# Patient Record
Sex: Female | Born: 1965 | Race: Asian | Hispanic: No | Marital: Single | Smoking: Never smoker
Health system: Southern US, Community
[De-identification: ages and names within clinical notes are randomized; demographics above are authoritative.]

## PROBLEM LIST (undated history)

## (undated) DIAGNOSIS — C801 Malignant (primary) neoplasm, unspecified: Secondary | ICD-10-CM

## (undated) DIAGNOSIS — R112 Nausea with vomiting, unspecified: Secondary | ICD-10-CM

## (undated) DIAGNOSIS — F32A Depression, unspecified: Secondary | ICD-10-CM

## (undated) DIAGNOSIS — B977 Papillomavirus as the cause of diseases classified elsewhere: Secondary | ICD-10-CM

## (undated) DIAGNOSIS — Z9889 Other specified postprocedural states: Secondary | ICD-10-CM

## (undated) DIAGNOSIS — R42 Dizziness and giddiness: Secondary | ICD-10-CM

## (undated) DIAGNOSIS — Z170 Estrogen receptor positive status [ER+]: Principal | ICD-10-CM

## (undated) DIAGNOSIS — C50411 Malignant neoplasm of upper-outer quadrant of right female breast: Principal | ICD-10-CM

## (undated) DIAGNOSIS — F329 Major depressive disorder, single episode, unspecified: Secondary | ICD-10-CM

## (undated) DIAGNOSIS — J310 Chronic rhinitis: Secondary | ICD-10-CM

## (undated) DIAGNOSIS — Z803 Family history of malignant neoplasm of breast: Secondary | ICD-10-CM

## (undated) DIAGNOSIS — F419 Anxiety disorder, unspecified: Secondary | ICD-10-CM

## (undated) DIAGNOSIS — R011 Cardiac murmur, unspecified: Secondary | ICD-10-CM

## (undated) DIAGNOSIS — Z8489 Family history of other specified conditions: Secondary | ICD-10-CM

## (undated) DIAGNOSIS — Z801 Family history of malignant neoplasm of trachea, bronchus and lung: Secondary | ICD-10-CM

## (undated) DIAGNOSIS — E7800 Pure hypercholesterolemia, unspecified: Secondary | ICD-10-CM

## (undated) HISTORY — DX: Depression, unspecified: F32.A

## (undated) HISTORY — DX: Family history of malignant neoplasm of trachea, bronchus and lung: Z80.1

## (undated) HISTORY — DX: Major depressive disorder, single episode, unspecified: F32.9

## (undated) HISTORY — DX: Malignant neoplasm of upper-outer quadrant of right female breast: C50.411

## (undated) HISTORY — DX: Family history of malignant neoplasm of breast: Z80.3

## (undated) HISTORY — DX: Papillomavirus as the cause of diseases classified elsewhere: B97.7

## (undated) HISTORY — DX: Estrogen receptor positive status (ER+): Z17.0

## (undated) HISTORY — DX: Pure hypercholesterolemia, unspecified: E78.00

## (undated) HISTORY — DX: Anxiety disorder, unspecified: F41.9

## (undated) HISTORY — PX: WISDOM TOOTH EXTRACTION: SHX21

---

## 2016-05-07 HISTORY — PX: BREAST BIOPSY: SHX20

## 2016-12-05 ENCOUNTER — Other Ambulatory Visit: Payer: Self-pay | Admitting: Radiology

## 2016-12-07 ENCOUNTER — Telehealth: Payer: Self-pay | Admitting: *Deleted

## 2016-12-07 NOTE — Telephone Encounter (Signed)
Confirmed [facility] for 12/12/16 at 1215 .  Instructions and contact information given.

## 2016-12-11 ENCOUNTER — Other Ambulatory Visit: Payer: Self-pay | Admitting: Emergency Medicine

## 2016-12-11 ENCOUNTER — Other Ambulatory Visit: Payer: Self-pay | Admitting: *Deleted

## 2016-12-11 DIAGNOSIS — Z170 Estrogen receptor positive status [ER+]: Secondary | ICD-10-CM

## 2016-12-11 DIAGNOSIS — C50411 Malignant neoplasm of upper-outer quadrant of right female breast: Secondary | ICD-10-CM

## 2016-12-11 DIAGNOSIS — Z171 Estrogen receptor negative status [ER-]: Principal | ICD-10-CM

## 2016-12-11 HISTORY — DX: Malignant neoplasm of upper-outer quadrant of right female breast: C50.411

## 2016-12-12 ENCOUNTER — Ambulatory Visit: Payer: BLUE CROSS/BLUE SHIELD | Attending: General Surgery | Admitting: Physical Therapy

## 2016-12-12 ENCOUNTER — Ambulatory Visit (HOSPITAL_BASED_OUTPATIENT_CLINIC_OR_DEPARTMENT_OTHER): Payer: BLUE CROSS/BLUE SHIELD | Admitting: Hematology and Oncology

## 2016-12-12 ENCOUNTER — Encounter: Payer: Self-pay | Admitting: Hematology and Oncology

## 2016-12-12 ENCOUNTER — Other Ambulatory Visit (HOSPITAL_BASED_OUTPATIENT_CLINIC_OR_DEPARTMENT_OTHER): Payer: BLUE CROSS/BLUE SHIELD

## 2016-12-12 ENCOUNTER — Encounter: Payer: Self-pay | Admitting: Physical Therapy

## 2016-12-12 ENCOUNTER — Ambulatory Visit
Admission: RE | Admit: 2016-12-12 | Discharge: 2016-12-12 | Disposition: A | Discharge status: Home / Self Care | Payer: BLUE CROSS/BLUE SHIELD | Source: Ambulatory Visit | Attending: Radiation Oncology | Admitting: Radiation Oncology

## 2016-12-12 DIAGNOSIS — C50411 Malignant neoplasm of upper-outer quadrant of right female breast: Secondary | ICD-10-CM | POA: Diagnosis present

## 2016-12-12 DIAGNOSIS — R293 Abnormal posture: Secondary | ICD-10-CM | POA: Insufficient documentation

## 2016-12-12 DIAGNOSIS — Z171 Estrogen receptor negative status [ER-]: Secondary | ICD-10-CM | POA: Diagnosis present

## 2016-12-12 DIAGNOSIS — Z170 Estrogen receptor positive status [ER+]: Secondary | ICD-10-CM | POA: Diagnosis not present

## 2016-12-12 LAB — CBC WITH DIFFERENTIAL/PLATELET
BASO%: 1.2 % (ref 0.0–2.0)
Basophils Absolute: 0.1 10*3/uL (ref 0.0–0.1)
EOS%: 1.9 % (ref 0.0–7.0)
Eosinophils Absolute: 0.1 10*3/uL (ref 0.0–0.5)
HCT: 45.6 % (ref 34.8–46.6)
HGB: 15.3 g/dL (ref 11.6–15.9)
LYMPH#: 1.6 10*3/uL (ref 0.9–3.3)
LYMPH%: 21.9 % (ref 14.0–49.7)
MCH: 30 pg (ref 25.1–34.0)
MCHC: 33.6 g/dL (ref 31.5–36.0)
MCV: 89.4 fL (ref 79.5–101.0)
MONO#: 0.5 10*3/uL (ref 0.1–0.9)
MONO%: 6.5 % (ref 0.0–14.0)
NEUT%: 68.5 % (ref 38.4–76.8)
NEUTROS ABS: 5 10*3/uL (ref 1.5–6.5)
PLATELETS: 210 10*3/uL (ref 145–400)
RBC: 5.1 10*6/uL (ref 3.70–5.45)
RDW: 13.8 % (ref 11.2–14.5)
WBC: 7.3 10*3/uL (ref 3.9–10.3)

## 2016-12-12 LAB — COMPREHENSIVE METABOLIC PANEL
ALT: 15 U/L (ref 0–55)
ANION GAP: 11 meq/L (ref 3–11)
AST: 19 U/L (ref 5–34)
Albumin: 4 g/dL (ref 3.5–5.0)
Alkaline Phosphatase: 47 U/L (ref 40–150)
BUN: 9.3 mg/dL (ref 7.0–26.0)
CO2: 24 meq/L (ref 22–29)
CREATININE: 0.8 mg/dL (ref 0.6–1.1)
Calcium: 9.7 mg/dL (ref 8.4–10.4)
Chloride: 106 mEq/L (ref 98–109)
EGFR: 88 mL/min/{1.73_m2} — ABNORMAL LOW (ref >90)
Glucose: 90 mg/dl (ref 70–140)
Potassium: 4.2 mEq/L (ref 3.5–5.1)
Sodium: 141 mEq/L (ref 136–145)
Total Bilirubin: 0.54 mg/dL (ref 0.20–1.20)
Total Protein: 7.7 g/dL (ref 6.4–8.3)

## 2016-12-12 NOTE — Progress Notes (Signed)
Radiation Oncology         [phone] ________________________________  Initial Outpatient  Consultation  Name: Lorraine Dean MRN: [id number]  Date: 12/12/2016  DOB: 1965-05-19  CC:Lorraine Pepper, MD  Lorraine Kussmaul, MD   REFERRING PHYSICIAN: [name], MD  DIAGNOSIS:51 y.o. woman with Invasive Ductal Carcinoma with Calcifications, grade 3, presenting in the right breast Stage IB (cT1c, cN0, cM0, G3, ER: Negative, PR: Negative, HER2: Negative)   HISTORY OF PRESENT ILLNESS::Lorraine Dean is a 51 y.o. female who is recently underwent routine screening and was noted to have a new cluster of heterogeneous calcifications within the right breast. Additional imaging was recommended. The patient proceeded to undergo a unilateral right digital diagnostic mammogram which revealed grouped pleomorphic calcifications within the right breast central to the nipple, middle depth spanned over 1.5 cm. On ultrasound this area measured 1.2 cm. Ultrasound-guided biopsy performed which revealed invasive ductal carcinoma, triple negative. With this information the patient is now seen in the multidisciplinary breast clinic. Earlier in the day the patient's imaging studies and pathology was reviewed at the multidisciplinary breast conference.  PREVIOUS RADIATION THERAPY: No  PAST MEDICAL HISTORY:  has a past medical history of Anxiety; Depression; High cholesterol; and HPV in female.    PAST SURGICAL HISTORY:No past surgical history on file.  FAMILY HISTORY: family history includes Breast cancer in her maternal aunt; Lung cancer in her maternal aunt and paternal aunt.  SOCIAL HISTORY:  reports that she has never smoked. She does not have any smokeless tobacco history on file. She reports that she drinks alcohol. She reports that she does not use drugs.  ALLERGIES: Patient has no allergy information on record.  MEDICATIONS:  Current Outpatient Prescriptions  Medication Sig Dispense Refill  . calcium  carbonate 1250 MG capsule Take 1,250 mg by mouth daily.    . fluticasone (FLONASE) 50 MCG/ACT nasal spray Place 1 spray into both nostrils as needed for allergies or rhinitis.    Marland Kitchen GARCINIA CAMBOGIA-CHROMIUM PO Take 1 tablet by mouth daily.    Marland Kitchen KRILL OIL PO Take 1 tablet by mouth daily.    . norgestimate-ethinyl estradiol (ORTHO-CYCLEN,SPRINTEC,PREVIFEM) 0.25-35 MG-MCG tablet Take 1 tablet by mouth daily.    . Probiotic Product (ALIGN PO) Take 1 tablet by mouth daily.    Marland Kitchen UNABLE TO FIND Take 1 tablet by mouth daily. Eyes hair and nail vitamin    . VITAMIN D, CHOLECALCIFEROL, PO Take 1 tablet by mouth daily.     No current facility-administered medications for this encounter.     REVIEW OF SYSTEMS:  REVIEW OF SYSTEMS: A 10+ POINT REVIEW OF SYSTEMS WAS OBTAINED including neurology, dermatology, psychiatry, cardiac, respiratory, lymph, extremities, GI, GU, musculoskeletal, constitutional, reproductive, HEENT. All pertinent positives are noted in the HPI. All others are negative.   PHYSICAL EXAM:  Lungs are clear to auscultation bilaterally. Heart has regular rate and rhythm. No palpable cervical, supraclavicular, or axillary adenopathy. Abdomen soft, non-tender, normal bowel sounds.  Oncology Vitals 12/12/2016  Weight 46.947 kg  Weight (lbs) 103 lbs 8 oz  Temp 98.3  Pulse 77  Resp 20  SpO2 100   Breast exam Left breast, No palpable mass nipple discharge or bleeding Right breast, no palpable mass nipple discharge or bleeding. The patient was noted to have some bruising along the lateral aspect of the breast area  ECOG = 1  LABORATORY DATA:  Lab Results  Component Value Date   WBC 7.3 12/12/2016   HGB 15.3 12/12/2016  HCT 45.6 12/12/2016   MCV 89.4 12/12/2016   PLT 210 12/12/2016   NEUTROABS 5.0 12/12/2016   Lab Results  Component Value Date   NA 141 12/12/2016   K 4.2 12/12/2016   CO2 24 12/12/2016   GLUCOSE 90 12/12/2016   CREATININE 0.8 12/12/2016   CALCIUM 9.7  12/12/2016      RADIOGRAPHY: No results found.    IMPRESSION: Stage IB (cT1c, cN0, cM0, G3, ER: Negative, PR: Negative, HER2: Negative) Invasive ductal carcinoma of the right breast,  grade 3. Patient is a candidate for Right Breast Lumpectomy with Sentinel Lymph Node, External Beam Radiation (XRT).   I recommend adjuvant radiotherapy to the right breast in order to decrease her risk of locoregional recurrence .  We discussed the risks, benefits, and side effects of radiotherapy. We discussed that radiation would take approximately 4-6 weeks to complete. We spoke about acute effects including skin irritation and fatigue as well as much less common late effects including injury to internal organs of the chest. We spoke about the latest technology that is used to minimize the risk of late effects for breast cancer patients undergoing radiotherapy. No guarantees of treatment were given. She is enthusiastic about proceeding with treatment. I look forward to participating in her care.    PLAN:Patient is a candidate for Right Breast Lumpectomy with Sentinel Lymph Node, External Beam Radiation (XRT), genetics, adjuvant chemotherapy to be determined.   ------------------------------------------------   Blair Promise, PhD, MD   This document serves as a record of services personally performed by Lorraine Pray MD. It was created on his behalf by Lorraine Dean, a trained medical scribe. The creation of this record is based on the scribe's personal observations and the provider's statements to them. This document has been checked and approved by the attending provider.

## 2016-12-12 NOTE — Therapy (Signed)
[place], [place], [address] Phone: [phone]   Fax:  [phone]  Physical Therapy Evaluation  Patient Details  Name: Lorraine Dean MRN: [id number] Date of Birth: 02-20-1966 Referring Provider: Dr. Autumn Messing  Encounter Date: 12/12/2016      PT End of Session - 12/12/16 1900    Visit Number 1   Number of Visits 1   PT Start Time 1505   PT Stop Time 1530   PT Time Calculation (min) 25 min   Activity Tolerance Patient tolerated treatment well   Behavior During Therapy [facility] for tasks assessed/performed      Past Medical History:  Diagnosis Date  . Anxiety   . Depression   . High cholesterol   . HPV in female     History reviewed. No pertinent surgical history.  There were no vitals filed for this visit.       Subjective Assessment - 12/12/16 1852    Subjective Patient reports she is here today to be seen by her medical team for her newly diagnosed right breast cancer.   Pertinent History Patient was diagnosed on 11/28/16 with right Triple negative grade 3 invasive ductal carcinoma breast cancer. It measures 1.5 cm and is located in the upper outer quadrant with a Ki67 of 5%.   Patient Stated Goals Reduce lymphedema risk and learn post op shoulder ROM HEP   Currently in Pain? No/denies            [facility] PT Assessment - 12/12/16 0001      Assessment   Medical Diagnosis Right breast cancer   Referring Provider Dr. Autumn Messing   Onset Date/Surgical Date 11/28/16   Hand Dominance Right   Prior Therapy none     Precautions   Precautions Other (comment)   Precaution Comments active cancer     Restrictions   Weight Bearing Restrictions No     Balance Screen   Has the patient fallen in the past 6 months No   Has the patient had a decrease in activity level because of a fear of falling?  No   Is the patient reluctant to leave their home because of a fear of falling?  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   Leisure She does not exercise     Cognition   Overall Cognitive Status Within Functional Limits for tasks assessed     Posture/Postural Control   Posture/Postural Control Postural limitations   Postural Limitations Rounded Shoulders;Forward head     ROM / Strength   AROM / PROM / Strength AROM;Strength     AROM   AROM Assessment Site Shoulder;Cervical   Right/Left Shoulder Right;Left   Right Shoulder Extension 42 Degrees   Right Shoulder Flexion 154 Degrees   Right Shoulder ABduction 170 Degrees   Right Shoulder Internal Rotation 76 Degrees   Right Shoulder External Rotation 90 Degrees   Left Shoulder Extension 42 Degrees   Left Shoulder Flexion 154 Degrees   Left Shoulder ABduction 176 Degrees   Left Shoulder Internal Rotation 80 Degrees   Left Shoulder External Rotation 88 Degrees   Cervical Flexion WNL   Cervical Extension WNL   Cervical - Right Side Bend WNL  Cervical - Left Side Bend WNL   Cervical - Right Rotation WNL   Cervical - Left Rotation WNL     Strength   Overall Strength Within functional limits for tasks performed           LYMPHEDEMA/ONCOLOGY QUESTIONNAIRE - 12/12/16 1858      Type   Cancer Type Right breast cancer     Lymphedema Assessments   Lymphedema Assessments Upper extremities     Right Upper Extremity Lymphedema   10 cm Proximal to Olecranon Process 25 cm   Olecranon Process 21.5 cm   10 cm Proximal to Ulnar Styloid Process 18.7 cm   Just Proximal to Ulnar Styloid Process 12.8 cm   Across Hand at [employer] 15.7 cm   At [place] of 2nd Digit 5.2 cm     Left Upper Extremity Lymphedema   10 cm Proximal to Olecranon Process 25 cm   Olecranon Process 21.4 cm   10 cm Proximal to  Ulnar Styloid Process 18 cm   Just Proximal to Ulnar Styloid Process 12.5 cm   Across Hand at [employer] 15.5 cm   At [place] of 2nd Digit 4.8 cm         Objective measurements completed on examination: See above findings.        Patient was instructed today in a home exercise program today for post op shoulder range of motion. These included active assist shoulder flexion in sitting, scapular retraction, wall walking with shoulder abduction, and hands behind head external rotation.  She was encouraged to do these twice a day, holding 3 seconds and repeating 5 times when permitted by her physician.             PT Education - 12/12/16 1859    Education provided Yes   Education Details Lymphedema risk reduction and post op shoulder ROM HEP   Person(s) Educated Patient   Methods Explanation;Demonstration;Handout   Comprehension Returned demonstration;Verbalized understanding              Breast Clinic Goals - 12/12/16 1905      Patient will be able to verbalize understanding of pertinent lymphedema risk reduction practices relevant to her diagnosis specifically related to skin care.   Time 1   Period Days   Status Achieved     Patient will be able to return demonstrate and/or verbalize understanding of the post-op home exercise program related to regaining shoulder range of motion.   Time 1   Period Days   Status Achieved     Patient will be able to verbalize understanding of the importance of attending the postoperative After Breast Cancer Class for further lymphedema risk reduction education and therapeutic exercise.   Time 1   Period Days   Status Achieved               Plan - 12/12/16 1901    Clinical Impression Statement Patient was diagnosed on 11/28/16 with right Triple negative grade 3 invasive ductal carcinoma breast cancer. It measures 1.5 cm and is located in the upper outer quadrant with a Ki67 of 5%. Her multidisciplinary medical team  met prior to her assessments to determine a recommended treatment plan. She is planning to have a right lumpectomy and sentinel node biopsy followed by radiation. Final pathology will determine her need for chemotherapy.   History and Personal Factors relevant to plan of care: None   Clinical Presentation Stable   Clinical Decision Making Low   Rehab  Potential Excellent   Clinical Impairments Affecting Rehab Potential None   PT Frequency One time visit   PT Treatment/Interventions Patient/family education;Therapeutic exercise   PT Next Visit Plan Will f/u after surgery to determine PT needs   PT Home Exercise Plan Post op shoulder ROM HEP   Consulted and Agree with Plan of Care Patient;Other (Comment)  Friend      Patient will benefit from skilled therapeutic intervention in order to improve the following deficits and impairments:  Postural dysfunction, Decreased knowledge of precautions, Pain, Impaired UE functional use, Decreased range of motion  Visit Diagnosis: Carcinoma of upper-outer quadrant of right breast in female, estrogen receptor negative ([place]) - Plan: PT plan of care cert/re-cert  Abnormal posture - Plan: PT plan of care cert/re-cert   Patient will follow up at outpatient cancer rehab if needed following surgery.  If the patient requires physical therapy at that time, a specific plan will be dictated and sent to the referring physician for approval. The patient was educated today on appropriate basic range of motion exercises to begin post operatively and the importance of attending the After Breast Cancer class following surgery.  Patient was educated today on lymphedema risk reduction practices as it pertains to recommendations that will benefit the patient immediately following surgery.  She verbalized good understanding.  No additional physical therapy is indicated at this time.      Problem List Patient Active Problem List   Diagnosis Date Noted  . Malignant neoplasm  of upper-outer quadrant of right breast in female, estrogen receptor positive ([place]) 12/11/2016    Annia Friendly, PT 12/12/16 7:08 PM  [place], [place], [address] Phone: [phone]   Fax:  [phone]  Name: Annamaria Salah MRN: [id number] Date of Birth: 04-01-1966

## 2016-12-12 NOTE — Patient Instructions (Signed)

## 2016-12-13 ENCOUNTER — Other Ambulatory Visit: Payer: BLUE CROSS/BLUE SHIELD

## 2016-12-13 ENCOUNTER — Ambulatory Visit: Payer: Self-pay | Admitting: General Surgery

## 2016-12-13 ENCOUNTER — Ambulatory Visit (HOSPITAL_BASED_OUTPATIENT_CLINIC_OR_DEPARTMENT_OTHER): Payer: BLUE CROSS/BLUE SHIELD | Admitting: Genetics

## 2016-12-13 ENCOUNTER — Encounter: Payer: Self-pay | Admitting: General Practice

## 2016-12-13 ENCOUNTER — Encounter: Payer: Self-pay | Admitting: Genetics

## 2016-12-13 DIAGNOSIS — C50411 Malignant neoplasm of upper-outer quadrant of right female breast: Secondary | ICD-10-CM

## 2016-12-13 DIAGNOSIS — Z801 Family history of malignant neoplasm of trachea, bronchus and lung: Secondary | ICD-10-CM | POA: Diagnosis not present

## 2016-12-13 DIAGNOSIS — Z803 Family history of malignant neoplasm of breast: Secondary | ICD-10-CM | POA: Diagnosis not present

## 2016-12-13 DIAGNOSIS — Z170 Estrogen receptor positive status [ER+]: Secondary | ICD-10-CM | POA: Diagnosis not present

## 2016-12-13 DIAGNOSIS — Z171 Estrogen receptor negative status [ER-]: Principal | ICD-10-CM

## 2016-12-13 HISTORY — DX: Family history of malignant neoplasm of trachea, bronchus and lung: Z80.1

## 2016-12-13 HISTORY — DX: Family history of malignant neoplasm of breast: Z80.3

## 2016-12-13 NOTE — Progress Notes (Signed)
Lorraine Dean Psychosocial Distress Screening Spiritual Care  Met with [name] and close friend/colleague May (who supported her own cousin through breast cancer) in Breast Multidisciplinary Clinic to introduce [place] team/resources, reviewing distress screen per protocol.  The patient scored a 9 on the Psychosocial Distress Thermometer which indicates severe distress. Also assessed for distress and other psychosocial needs.   ONCBCN DISTRESS SCREENING 12/13/2016  Screening Type Initial Screening  Distress experienced in past week (1-10) 9  Emotional problem type Depression;Adjusting to illness  Spiritual/Religous concerns type Relating to God;Loss of Faith;Facing my mortality  Information Concerns Type Lack of info about diagnosis  Physical Problem type Sleep/insomnia;Loss of appetitie  Referral to support programs Yes   Per pt, she lives alone, having relocated from [place] (where extended family lives) for her work as a English as a second language teacher in [employer].  She experienced very high distress about dx in part because of her social isolation:  Her aunt had cancer, her sister died recently (sepsis/sudden), and her mom is still very actively grieving the loss, so [name] chose not to share about dx until learning about scope at [facility].  We talked about how [name] programming could help with social connection, meaning-making, and coping.  Encouraged self-care and relaxation in wake of high distress and adrenaline prior to [facility].  Follow up needed: No.  Pt is aware of ongoing [name] team/programming availability and plans to contact Team as she is ready.   [name], [place], [place] [facility] Pager [phone] Voicemail [phone]

## 2016-12-13 NOTE — Progress Notes (Signed)
[facility] CONSULT NOTE  Patient Care Team: Farris Has, MD as PCP - General (Family Medicine) Serena Croissant, MD as Consulting Physician (Hematology and Oncology) Griselda Miner, MD as Consulting Physician (General Surgery) Antony Blackbird, MD as Consulting Physician (Radiation Oncology)  CHIEF COMPLAINTS/PURPOSE OF CONSULTATION:  Newly diagnosed breast cancer  HISTORY OF PRESENTING ILLNESS:  Lorraine Dean 51 y.o. female is here because of recent diagnosis of rt breast cancer. Patient had a screening mammogram that detected calcifications that span 1.5 cm and ultrasound revealed a mas measuring 1.2 cm. Biopsy revealed triple negative IDC grade 3 tumor. We discussed her case in the tumor board and she is here at [facility] clinic to discuss the treatment plan   I reviewed her records extensively and collaborated the history with the patient.  SUMMARY OF ONCOLOGIC HISTORY:   Malignant neoplasm of upper-outer quadrant of right breast in female, estrogen receptor positive ([facility])   12/05/2016 Initial Diagnosis    Screening detected calcifications Rt breast spanning 1.5 cm, U/S measured a lesion 1.2 cm size, Biopsy revealed IDC Grade 3, ER/PR Negative, Her 2 Neg Ratio: 1.05; Ki 67: 20%; T1bN0 Stage 1A Clinical stage      MEDICAL HISTORY:  Past Medical History:  Diagnosis Date  . Anxiety   . Depression   . High cholesterol   . HPV in female     SURGICAL HISTORY: History reviewed. No pertinent surgical history.  SOCIAL HISTORY: Social History   Social History  . Marital status: Single    Spouse name: N/A  . Number of children: N/A  . Years of education: N/A   Occupational History  . Not on file.   Social History Main Topics  . Smoking status: Never Smoker  . Smokeless tobacco: Not on file  . Alcohol use Yes     Comment: occas  . Drug use: No  . Sexual activity: Not on file   Other Topics Concern  . Not on file   Social History Narrative  . No narrative on  file    FAMILY HISTORY: Family History  Problem Relation Age of Onset  . Breast cancer Maternal Aunt   . Lung cancer Maternal Aunt   . Lung cancer Paternal Aunt     ALLERGIES:  has no allergies on file.  MEDICATIONS:  Current Outpatient Prescriptions  Medication Sig Dispense Refill  . calcium carbonate 1250 MG capsule Take 1,250 mg by mouth daily.    . fluticasone (FLONASE) 50 MCG/ACT nasal spray Place 1 spray into both nostrils as needed for allergies or rhinitis.    Marland Kitchen GARCINIA CAMBOGIA-CHROMIUM PO Take 1 tablet by mouth daily.    Marland Kitchen KRILL OIL PO Take 1 tablet by mouth daily.    . norgestimate-ethinyl estradiol (ORTHO-CYCLEN,SPRINTEC,PREVIFEM) 0.25-35 MG-MCG tablet Take 1 tablet by mouth daily.    . Probiotic Product (ALIGN PO) Take 1 tablet by mouth daily.    Marland Kitchen UNABLE TO FIND Take 1 tablet by mouth daily. Eyes hair and nail vitamin    . VITAMIN D, CHOLECALCIFEROL, PO Take 1 tablet by mouth daily.     No current facility-administered medications for this visit.     REVIEW OF SYSTEMS:   Constitutional: Denies fevers, chills or abnormal night sweats Eyes: Denies blurriness of vision, double vision or watery eyes Ears, nose, mouth, throat, and face: Denies mucositis or sore throat Respiratory: Denies cough, dyspnea or wheezes Cardiovascular: Denies palpitation, chest discomfort or lower extremity swelling Gastrointestinal:  Denies nausea, heartburn or change  in bowel habits Skin: Denies abnormal skin rashes Lymphatics: Denies new lymphadenopathy or easy bruising Neurological:Denies numbness, tingling or new weaknesses Behavioral/Psych: Mood is stable, no new changes  Breast:  Denies any palpable lumps or discharge All other systems were reviewed with the patient and are negative.  PHYSICAL EXAMINATION: ECOG PERFORMANCE STATUS: 0 - Asymptomatic  Vitals:   12/12/16 1302  BP: 123/72  Pulse: 77  Resp: 20  Temp: 98.3 F (36.8 C)   Filed Weights   12/12/16 1302  Weight:  103 lb 8 oz (46.9 kg)    GENERAL:alert, no distress and comfortable SKIN: skin color, texture, turgor are normal, no rashes or significant lesions EYES: normal, conjunctiva are pink and non-injected, sclera clear OROPHARYNX:no exudate, no erythema and lips, buccal mucosa, and tongue normal  NECK: supple, thyroid normal size, non-tender, without nodularity LYMPH:  no palpable lymphadenopathy in the cervical, axillary or inguinal LUNGS: clear to auscultation and percussion with normal breathing effort HEART: regular rate & rhythm and no murmurs and no lower extremity edema ABDOMEN:abdomen soft, non-tender and normal bowel sounds Musculoskeletal:no cyanosis of digits and no clubbing  PSYCH: alert & oriented x 3 with fluent speech NEURO: no focal motor/sensory deficits BREAST: No palpable nodules in breast. No palpable axillary or supraclavicular lymphadenopathy (exam performed in the presence of a chaperone)   LABORATORY DATA:  I have reviewed the data as listed Lab Results  Component Value Date   WBC 7.3 12/12/2016   HGB 15.3 12/12/2016   HCT 45.6 12/12/2016   MCV 89.4 12/12/2016   PLT 210 12/12/2016   Lab Results  Component Value Date   NA 141 12/12/2016   K 4.2 12/12/2016   CO2 24 12/12/2016    RADIOGRAPHIC STUDIES: I have personally reviewed the radiological reports and agreed with the findings in the report.  ASSESSMENT AND PLAN:  Malignant neoplasm of upper-outer quadrant of right breast in female, estrogen receptor positive ([place]) 12/05/16: Screening detected calcifications Rt breast spanning 1.5 cm, U/S measured a lesion 1.2 cm size, Biopsy revealed IDC Grade 3, ER/PR Negative, Her 2 Neg Ratio: 1.05; Ki 67: 20%; T1bN0 Stage 1A Clinical stage  Pathology and radiology counseling:Discussed with the patient, the details of pathology including the type of breast cancer,the clinical staging, the significance of ER, PR and HER-2/neu receptors and the implications for treatment.  After reviewing the pathology in detail, we proceeded to discuss the different treatment options between surgery, radiation, chemotherapy, antiestrogen therapies.  Recommendations: 1. Breast conserving surgery followed by 2. Adj chemo if final tumor size is > 0.5 cm 3. Adjuvant radiation therapy followed by  Return to clinic after surgery to discuss final pathology report and to determine if she needs adj chemo.    All questions were answered. The patient knows to call the clinic with any problems, questions or concerns.    Rulon Eisenmenger, MD 12/13/16

## 2016-12-13 NOTE — Progress Notes (Signed)
REFERRING PROVIDER: Nicholas Lose, MD [address] [place], [place] [address]  PRIMARY PROVIDER:  [name], MD  PRIMARY REASON FOR VISIT:  1. Malignant neoplasm of upper-outer quadrant of right breast in female, estrogen receptor positive ([place])   2. Family history of breast cancer   3. Family history of lung cancer     HISTORY OF PRESENT ILLNESS:   Lorraine Dean, a 51 y.o. female, was seen for a [facility] cancer genetics consultation at the request of Dr. Lindi Adie due to a personal and family history of cancer.  Lorraine Dean presents to clinic today to discuss the possibility of a hereditary predisposition to cancer, genetic testing, and to further clarify her future cancer risks, as well as potential cancer risks for family members.   In  August 2018, at the age of 80, Lorraine Dean was diagnosed with Triple Negative Invasive Ductal Carcinoma of the right breast. She is considering treatment options at this time.    CANCER HISTORY:    Malignant neoplasm of upper-outer quadrant of right breast in female, estrogen receptor positive ([place])   12/05/2016 Initial Diagnosis    Screening detected calcifications Rt breast spanning 1.5 cm, U/S measured a lesion 1.2 cm size, Biopsy revealed IDC Grade 3, ER/PR Negative, Her 2 Neg Ratio: 1.05; Ki 67: 20%; T1bN0 Stage 1A Clinical stage        HORMONAL RISK FACTORS:  Menarche was at age 81  First live birth at age N/A.  Ovaries intact: yes.  Hysterectomy: no.  Mammogram within the last year: yes.  Past Medical History:  Diagnosis Date  . Anxiety   . Depression   . Family history of breast cancer 12/13/2016  . Family history of lung cancer 12/13/2016  . High cholesterol   . HPV in female     No past surgical history on file.  Social History   Social History  . Marital status: Single    Spouse name: N/A  . Number of children: N/A  . Years of education: N/A   Social History Main Topics  . Smoking status: Never Smoker  .  Smokeless tobacco: Not on file  . Alcohol use Yes     Comment: occas  . Drug use: No  . Sexual activity: Not on file   Other Topics Concern  . Not on file   Social History Narrative  . No narrative on file     FAMILY HISTORY:  We obtained a detailed, 4-generation family history.  Significant diagnoses are listed below: Family History  Problem Relation Age of Onset  . Breast cancer Maternal Aunt   . Lung cancer Maternal Aunt   . Lung cancer Paternal Aunt    Ms Dean has no children or any siblings.  Her father is 56 with no history of cancer.  Lorraine Dean has 3 paternal uncles and 5 paternal aunts.  - 1 maternal aunt was diagnosed with lung cancer in her late 72's and died in her 57's.  She had no children. -1 maternal aunt is in her 57's and 20's with no history of cancer.  She has a daughter who was diagnosed with melanoma in her 46's and is now in her 3's/40's.  -Lorraine Dean has 2 paternal aunts and 1 paternal uncle who died in their 21's and 67's with no history of cancer -Lorraine Dean has 2 paternal uncles and 1 other paternal aunt who are in their 23's and 62's with no history of cancer.  Lorraine Dean has many  paternal cousins.  She has limited information about them, but does not know of any history of cancer.   Lorraine Dean paternal grandfather died in his 23's with no history of cancer.  He had heart disease. Lorraine Dean paternal grandmother died in her 34's with no history of cancer.  She had heart disease.    Lorraine Dean mother is 49 and has no history of cancer.  Lorraine Dean has 2 maternal aunts and 3 maternal uncles listed below: -1 maternal aunt died in her 82's with no history of cancer.  She had no children.  -1 maternal aunt was diagnosed with breast cancer at 26.  She developed lung cancer after, and the patient is unsure if the lung cancer was a primary or a metastasis of the breast cancer.  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  This aunt died at the age of 23.  She had no children.   -1 maternal uncle died in his 106's and had no children or any history of cancer.  -1 maternal uncle is in his late 90's with no history of cancer.  He has 4 children with no known history of cancer, although the patient has limited information about these cousins. -1 maternal uncle is in his late 60's with no history of cancer.  He has 3 children with no known history of cancer, although the patient has limited information about these cousins. Lorraine Dean maternal grandfather died in his 65's with no history of cancer. Lorraine Dean maternal grandmother died in her 40's with no history of cancer.   Lorraine Dean is unaware of previous family history of genetic testing for hereditary cancer risks. Patient's maternal ancestors are from the [place] descent, and paternal ancestors are from the [place]. There is no reported Ashkenazi Jewish ancestry. There is no known consanguinity.  GENETIC COUNSELING ASSESSMENT: Lorraine Dean is a 51 y.o. female with a personal and family history which is somewhat suggestive of a Hereditary Cancer Predisposition Syndrome. We, therefore, discussed and recommended the following at today's visit.   DISCUSSION:  We reviewed the characteristics, features and inheritance patterns of hereditary cancer syndromes. We also discussed genetic testing, including the appropriate family members to test, the process of testing, insurance coverage and turn-around-time for results. We discussed the implications of a negative, positive and/or variant of uncertain significant result. In order to get genetic test results in a timely manner so that Lorraine Dean can use these genetic test results for surgical decisions, we recommended Lorraine Dean pursue genetic testing for the [facility] Breast Cancer STAT Panel. The STAT Breast cancer panel offered by [facility] includes sequencing and rearrangement analysis for the  following 9 genes:  ATM, BRCA1, BRCA2, CDH1, CHEK2, PALB2, PTEN, STK11 and TP53.   If this test is negative, we then recommend Lorraine Dean pursue reflex genetic testing to the [facility] Common Hereditary Cancer gene panel. The Hereditary Gene Panel offered by [facility] includes sequencing and/or deletion duplication testing of the following 46 genes: APC, ATM, AXIN2, BARD1, BMPR1A, BRCA1, BRCA2, BRIP1, CDH1, CDKN2A (p14ARF), CDKN2A (p16INK4a), CHEK2, CTNNA1, DICER1, EPCAM (Deletion/duplication testing only), GREM1 (promoter region deletion/duplication testing only), KIT, MEN1, MLH1, MSH2, MSH3, MSH6, MUTYH, NBN, NF1, NHTL1, PALB2, PDGFRA, PMS2, POLD1, POLE, PTEN, RAD50, RAD51C, RAD51D, SDHB, SDHC, SDHD, SMAD4, SMARCA4. STK11, TP53, TSC1, TSC2, and VHL.  The following genes were evaluated for sequence changes only: SDHA and HOXB13 c.251G>A variant only.  We discussed that only 5-10% of cancers are associated  with a Hereditary cancer predisposition syndrome.  One of the most common hereditary cancer syndromes that increases breast cancer risk is called Hereditary Breast and Ovarian Cancer (HBOC) syndrome.  This syndrome is caused by mutations in the BRCA1 and BRCA2 genes.  This syndrome increases an individual's lifetime risk to develop breast, ovarian, pancreatic, and other types of cancer.  There are also many other cancer predisposition syndromes caused by mutations in several other genes.  Triple negative breast cancer specifically has been associated with this mutations in these genes.   We discussed that if she is found to have a mutation in one of these genes, it may impact surgical decisions, and alter future medical management recommendations such as increased cancer screenings and consideration of risk reducing surgeries.  A positive result could also have implications for the patient's family members.  A Negative result would mean we were unable to identify a hereditary component to her cancer, but does  not rule out the possibility of a hereditary basis for her cancer.  There could be mutations that are undetectable by current technology, or in genes not yet tested or identified to increase cancer risk.    We discussed the potential to find a Variant of Uncertain Significance or VUS.  These are variants that have not yet been identified as pathogenic or benign, and it is unknown if this variant is associated with increased cancer risk or if this is a normal finding.  Most VUS's are reclassified to benign or likely benign.   It should not be used to make medical management decisions. With time, we suspect the lab will determine the significance of any VUS's identified if any.   Based on Ms. Commins's personal history of cancer, she meets medical criteria for genetic testing. Despite that she meets criteria, she may still have an out of pocket cost. We discussed that if her out of pocket cost for testing is over $100, the laboratory will call and confirm whether she wants to proceed with testing.  If the out of pocket cost of testing is less than $100 she will be billed by the genetic testing laboratory.   PLAN: After considering the risks, benefits, and limitations, Ms. Ogden  provided informed consent to pursue genetic testing and the blood sample was sent to [facility] for analysis of the Breast Cancer STAT Panel (with plans to order Common Hereditary Cancer Panel if negative). Results should be available within approximately 5-10 days' time, at which point they will be disclosed by telephone to Ms. Thum, as will any additional recommendations warranted by these results. Ms. Mensch will receive a summary of her genetic counseling visit and a copy of her results once available. This information will also be available in Epic. We encouraged Ms. Seefeldt to remain in contact with cancer genetics annually so that we can continuously update the family history and inform her of any changes in cancer  genetics and testing that may be of benefit for her family. Ms. Tugwell questions were answered to her satisfaction today. Our contact information was provided should additional questions or concerns arise.  Lastly, we encouraged Ms. Holden to remain in contact with cancer genetics annually so that we can continuously update the family history and inform her of any changes in cancer genetics and testing that may be of benefit for this family.   Ms.  Childers questions were answered to her satisfaction today. Our contact information was provided should additional questions or concerns arise. Thank you for  the referral and allowing [place] to share in the care of your patient.   Tana Felts, MS Genetic Counselor [email or web address] phone: [phone]  The patient was seen for a total of 40 minutes in face-to-face genetic counseling.  This patient was discussed with Drs. Magrinat, Lindi Adie and/or Burr Medico who agrees with the above.

## 2016-12-13 NOTE — Assessment & Plan Note (Signed)
12/05/16: Screening detected calcifications Rt breast spanning 1.5 cm, U/S measured a lesion 1.2 cm size, Biopsy revealed IDC Grade 3, ER/PR Negative, Her 2 Neg Ratio: 1.05; Ki 67: 20%; T1bN0 Stage 1A Clinical stage  Pathology and radiology counseling:Discussed with the patient, the details of pathology including the type of breast cancer,the clinical staging, the significance of ER, PR and HER-2/neu receptors and the implications for treatment. After reviewing the pathology in detail, we proceeded to discuss the different treatment options between surgery, radiation, chemotherapy, antiestrogen therapies.  Recommendations: 1. Breast conserving surgery followed by 2. Adj chemo if final tumor size is > 0.5 cm 3. Adjuvant radiation therapy followed by  Return to clinic after surgery to discuss final pathology report and to determine if she needs adj chemo.

## 2016-12-13 NOTE — Progress Notes (Signed)
Nutrition Assessment  Reason for Assessment:  Pt seen in Breast Clinic on 12/12/2016 (late entry due to downtime)  ASSESSMENT:   51 year old female with new diagnosis of breast cancer.  Past medical history of HLD, HPV.    Patient reports normal appetite  Medications:  reviewed  Labs: reviewed  Anthropometrics:   Height: not measured Weight: 103 lb 8 oz  Stable weight per patient   NUTRITION DIAGNOSIS: Food and nutrition related knowledge deficit related to new diagnosis of breast cancer as evidenced by no prior need for nutrition related information.  INTERVENTION:   Discussed and provided packet of information regarding nutritional tips for breast cancer patients.  Questions answered.  Teachback method used.  Contact information provided and patient knows to contact me with questions/concerns.    MONITORING, EVALUATION, and GOAL: Pt will consume a healthy plant based diet to maintain lean body mass throughout treatment.   Chrystine Frogge B. Zenia Resides, [place], [place] Registered Dietitian [phone] (pager)

## 2016-12-18 ENCOUNTER — Telehealth: Payer: Self-pay | Admitting: *Deleted

## 2016-12-18 ENCOUNTER — Telehealth: Payer: Self-pay | Admitting: Hematology and Oncology

## 2016-12-18 NOTE — Telephone Encounter (Signed)
  Oncology Nurse Navigator Documentation  Navigator Location: [facility] (12/18/16 1100)   )Navigator Encounter Type: Telephone (12/18/16 1100) Telephone: Outgoing Call;Clinic/[facility]C Follow-up (12/18/16 1100)       Genetic Counseling Date: 12/13/16 (12/18/16 1100) Genetic Counseling Type: Urgent (12/18/16 1100)                                        Time Spent with Patient: 15 (12/18/16 1100)

## 2016-12-18 NOTE — Telephone Encounter (Signed)
sw pt to confirm 8/31 appt at 0930 per sch msg

## 2016-12-20 ENCOUNTER — Telehealth: Payer: Self-pay

## 2016-12-20 ENCOUNTER — Encounter: Payer: Self-pay | Admitting: Genetics

## 2016-12-20 ENCOUNTER — Telehealth: Payer: Self-pay | Admitting: Genetics

## 2016-12-20 DIAGNOSIS — Z1379 Encounter for other screening for genetic and chromosomal anomalies: Secondary | ICD-10-CM | POA: Insufficient documentation

## 2016-12-20 NOTE — Telephone Encounter (Signed)
Revealed negative preliminary genetic testing.  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  Discussed that we do not know why she has breast cancer or why there is cancer in the family. It could be due to a different gene that we are not testing, or maybe our current technology may not be able to pick something up.  It will be important for her to keep in contact with genetics to keep up with whether additional testing may be needed.  These were the high risk breast results that are helpful for her breast surgery decisions.   She would like reflex testing to the common hereditary cancer panel + melanoma genes, and we will have to wait for the full results of this panel.

## 2016-12-20 NOTE — Telephone Encounter (Signed)
SENT NOTES TO SCHEDULING 

## 2016-12-24 ENCOUNTER — Encounter (INDEPENDENT_AMBULATORY_CARE_PROVIDER_SITE_OTHER): Payer: Self-pay

## 2016-12-24 ENCOUNTER — Ambulatory Visit (INDEPENDENT_AMBULATORY_CARE_PROVIDER_SITE_OTHER): Payer: BLUE CROSS/BLUE SHIELD

## 2016-12-24 ENCOUNTER — Telehealth: Payer: Self-pay | Admitting: Genetics

## 2016-12-24 ENCOUNTER — Encounter: Payer: Self-pay | Admitting: Physician Assistant

## 2016-12-24 ENCOUNTER — Other Ambulatory Visit: Payer: Self-pay | Admitting: Physician Assistant

## 2016-12-24 ENCOUNTER — Ambulatory Visit (INDEPENDENT_AMBULATORY_CARE_PROVIDER_SITE_OTHER): Payer: BLUE CROSS/BLUE SHIELD | Admitting: Physician Assistant

## 2016-12-24 VITALS — BP 130/82 | HR 90 | Ht 61.0 in | Wt 103.0 lb

## 2016-12-24 DIAGNOSIS — Z01818 Encounter for other preprocedural examination: Secondary | ICD-10-CM | POA: Diagnosis not present

## 2016-12-24 DIAGNOSIS — R011 Cardiac murmur, unspecified: Secondary | ICD-10-CM

## 2016-12-24 DIAGNOSIS — E785 Hyperlipidemia, unspecified: Secondary | ICD-10-CM | POA: Diagnosis not present

## 2016-12-24 DIAGNOSIS — Z8249 Family history of ischemic heart disease and other diseases of the circulatory system: Secondary | ICD-10-CM | POA: Insufficient documentation

## 2016-12-24 DIAGNOSIS — R002 Palpitations: Secondary | ICD-10-CM | POA: Diagnosis not present

## 2016-12-24 NOTE — Progress Notes (Signed)
Cardiology Office Note    Date:  12/24/2016   ID:  [name], DOB [birth year], MRN [id number]  PCP:  [name], MD  Cardiologist: New  Chief Complaint  Patient presents with  . Pre-op Exam  . Palpitations    History of Present Illness:    Lorraine Dean is a 51 y.o. female who is being seen today for the evaluation of Palpitations at the request of [name], MD.  Patient was recently diagnosed with stage I breast CA and is having surgery at [facility] surgery 12/28/16. Patient has a long history of palpitations and was referred here for further evaluation. Was also told she had a heart murmur recent surgery consult. She's had a lot of stress and less sleep.  Patient comes in today complaining of palpitations for over a year. She notices them most at night when she lays down and she turns over and they go away. They happen every night. She says it just races and pounds but does not skip. She says it's happening right now in the office but when I take her pulse and is regular in the 70s. She states "she just wants to make sure her heart doesn't explode during surgery". She has no associated dyspnea but sometimes some pressure. 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 She can also walk 30 minutes without difficulty. She drinks 1 cup coffee daily and 3-4 sodas daily but usually Sprite. She denies exertional chest tightness, pressure, dyspnea, dizziness or presyncope. She says she has high cholesterol and is not on any medication for it but I don't have those lab results. Her father also had CABG in his 109s and was a smoker. She is a nonsmoker no history of diabetes or hypertension.  Past Medical History:  Diagnosis Date  . Anxiety   . Depression   . Family history of breast cancer 12/13/2016  . Family history of lung cancer 12/13/2016  . High cholesterol   . HPV in female     Past Surgical History:  Procedure Laterality Date  .  BREAST BIOPSY Right 2018   triple negative IDC grade 3 tumor    Current Medications: Current Meds  Medication Sig  . fluticasone (FLONASE) 50 MCG/ACT nasal spray Place 1 spray into both nostrils as needed for allergies or rhinitis.     Allergies:   Patient has no known allergies.   Social History   Social History  . Marital status: Single    Spouse name: N/A  . Number of children: N/A  . Years of education: N/A   Social History Main Topics  . Smoking status: Never Smoker  . Smokeless tobacco: Never Used  . Alcohol use Yes     Comment: occas  . Drug use: No  . Sexual activity: Not Asked   Other Topics Concern  . None   Social History Narrative  . None     Family History:  The patient's family history includes Breast cancer (age of onset: 37) in her maternal aunt; Heart disease in her paternal grandfather and paternal grandmother; Heart disease (age of onset: 65) in her father; Lung cancer in her maternal aunt and paternal aunt.   ROS:   Please see the history of present illness.    Review of Systems  Constitution: Negative.  HENT: Negative.   Eyes: Negative.   Cardiovascular: Positive for palpitations.  Respiratory: Negative.   Hematologic/Lymphatic: Negative.   Musculoskeletal: Negative.  Negative for joint pain.  Gastrointestinal: Negative.   Genitourinary: Negative.   Neurological: Negative.   Psychiatric/Behavioral: The patient is nervous/anxious.    All other systems reviewed and are negative.   PHYSICAL EXAM:   VS:  BP 130/82   Pulse 90   Ht 5\' 1"  (1.549 m)   Wt 103 lb (46.7 kg)   SpO2 98%   BMI 19.46 kg/m   Physical Exam  GEN: Thin, in no acute distress  Neck: no JVD, carotid bruits, or masses Cardiac:RRR; 1 to 2/6 systolic murmur at the apex Respiratory:  clear to auscultation bilaterally, normal work of breathing GI: soft, nontender, nondistended, + BS Ext: without cyanosis, clubbing, or edema, Good distal pulses bilaterally MS: no  deformity or atrophy  Skin: warm and dry, no rash Neuro:  Alert and Oriented x 3 Psych: euthymic mood, full affect  Wt Readings from Last 3 Encounters:  12/24/16 103 lb (46.7 kg)  12/12/16 103 lb 8 oz (46.9 kg)      Studies/Labs Reviewed:   EKG:  EKG is not ordered today.  EKG 12/19/16 from primary care reviewed and shows normal sinus rhythm, normal EKG   Recent Labs: 12/12/2016: ALT 15; BUN 9.3; Creatinine 0.8; HGB 15.3; Platelets 210; Potassium 4.2; Sodium 141   Lipid Panel No results found for: CHOL, TRIG, HDL, CHOLHDL, VLDL, LDLCALC, LDLDIRECT  Additional studies/ records that were reviewed today include:      ASSESSMENT:    1. Preoperative clearance   2. Palpitations   3. Heart murmur   4. Hyperlipidemia, unspecified hyperlipidemia type   5. Family history of early CAD      PLAN:  In order of problems listed above:  Preoperative clearance before undergoing lumpectomy and lymph node removal for stage I breast CA 12/28/16. Patient has one year history of palpitations described as racing of her heart when she lays down at night. 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 Discussed this patient in detail with Dr. Harrington Challenger who feels that this patient is at low risk for cardiac complications during surgery. We'll place a 48 hour monitor today to rule out significant arrhythmia. We'll order 2-D echo but does not have to be done prior to surgery very minor murmur. If she develops exertional symptoms can always do a stress test or family history but does not need to have prior to surgery. Follow-up with me in one month.  Palpitations placing 48-hour monitor today. Normal TSH 12/20/16 recommend reducing sodas.  Heart murmur mild systolic murmur at the apex check 2-D echo  Hyperlipidemia per patient. We'll try to obtain labs from primary care. With family history of CAD if she truly has hyperlipidemia would started her on a  statin.  Family history of CAD with her father having CABG in his 18s    Medication Adjustments/Labs and Tests Ordered: Current medicines are reviewed at length with the patient today.  Concerns regarding medicines are outlined above.  Medication changes, Labs and Tests ordered today are listed in the Patient Instructions below. Patient Instructions  Medication Instructions:  Your physician recommends that you continue on your current medications as directed. Please refer to the Current Medication list given to you today.   Labwork: None Ordered   Testing/Procedures: Your physician has requested that you have an echocardiogram. Echocardiography is a painless test that uses sound waves to create images of your heart. It provides your doctor with information about the  size and shape of your heart and how well your heart's chambers and valves are working. This procedure takes approximately one hour. There are no restrictions for this procedure.  Your physician has recommended that you wear a holter monitor. Holter monitors are medical devices that record the heart's electrical activity. Doctors most often use these monitors to diagnose arrhythmias. Arrhythmias are problems with the speed or rhythm of the heartbeat. The monitor is a small, portable device. You can wear one while you do your normal daily activities. This is usually used to diagnose what is causing palpitations/syncope (passing out).      Follow-Up: Your physician recommends that you schedule a follow-up appointment in: please see Nena Polio in 1 month.   Any Other Special Instructions Will Be Listed Below (If Applicable).     If you need a refill on your cardiac medications before your next appointment, please call your pharmacy.      Sumner Boast, PA-C  12/24/2016 10:33 AM    [facility] [place], [place], [place]  [address] Phone: [phone]; Fax: [phone]

## 2016-12-24 NOTE — Telephone Encounter (Signed)
Revealed negative genetic testing.  Revealed 2 other VUS's were identified in MSH6 and TERT (in addition to BRCA1 VUS). Discussed that we do not know why she has breast cancer or why there is cancer in the family. It could be due to a different gene that we are not testing, or maybe our current technology may not be able to pick something up.  It will be important for her to keep in contact with genetics to keep up with whether additional testing may be needed.

## 2016-12-24 NOTE — Telephone Encounter (Signed)
Revealed negative genetic testing.  Revealed 2 other VUS's were identified in MSH6 and TERT. Discussed that we do not know why she has breast cancer or why there is cancer in the family. It could be due to a different gene that we are not testing, or maybe our current technology may not be able to pick something up.  It will be important for her to keep in contact with genetics to keep up with whether additional testing may be needed.

## 2016-12-24 NOTE — Patient Instructions (Addendum)
Medication Instructions:  Your physician recommends that you continue on your current medications as directed. Please refer to the Current Medication list given to you today.   Labwork: None Ordered   Testing/Procedures: Your physician has requested that you have an echocardiogram. Echocardiography is a painless test that uses sound waves to create images of your heart. It provides your doctor with information about the size and shape of your heart and how well your heart's chambers and valves are working. This procedure takes approximately one hour. There are no restrictions for this procedure.  Your physician has recommended that you wear a holter monitor. Holter monitors are medical devices that record the heart's electrical activity. Doctors most often use these monitors to diagnose arrhythmias. Arrhythmias are problems with the speed or rhythm of the heartbeat. The monitor is a small, portable device. You can wear one while you do your normal daily activities. This is usually used to diagnose what is causing palpitations/syncope (passing out).      Follow-Up: Your physician recommends that you schedule a follow-up appointment in: please see Nena Polio in 1 month.   Any Other Special Instructions Will Be Listed Below (If Applicable).     If you need a refill on your cardiac medications before your next appointment, please call your pharmacy.

## 2016-12-25 ENCOUNTER — Encounter (HOSPITAL_BASED_OUTPATIENT_CLINIC_OR_DEPARTMENT_OTHER): Payer: Self-pay | Admitting: *Deleted

## 2016-12-25 ENCOUNTER — Ambulatory Visit: Payer: Self-pay | Admitting: Genetics

## 2016-12-25 ENCOUNTER — Encounter: Payer: Self-pay | Admitting: Genetics

## 2016-12-25 DIAGNOSIS — Z803 Family history of malignant neoplasm of breast: Secondary | ICD-10-CM

## 2016-12-25 DIAGNOSIS — C50411 Malignant neoplasm of upper-outer quadrant of right female breast: Secondary | ICD-10-CM

## 2016-12-25 DIAGNOSIS — Z1379 Encounter for other screening for genetic and chromosomal anomalies: Secondary | ICD-10-CM

## 2016-12-25 DIAGNOSIS — Z170 Estrogen receptor positive status [ER+]: Principal | ICD-10-CM

## 2016-12-25 DIAGNOSIS — Z801 Family history of malignant neoplasm of trachea, bronchus and lung: Secondary | ICD-10-CM

## 2016-12-25 NOTE — Progress Notes (Signed)
HPI: Lorraine Dean was previously seen in the [place] clinic on 12/14/2016 due to a personal history of triple negative breast cancer, family history of breast and lung cancer, and concerns regarding a hereditary predisposition to cancer. Please refer to our prior cancer genetics clinic note for more information regarding Lorraine Dean's medical, social and family histories, and our assessment and recommendations, at the time. Lorraine Dean recent genetic test results were disclosed to her, as well as recommendations warranted by these results. These results and recommendations are discussed in more detail below.  CANCER HISTORY:    Malignant neoplasm of upper-outer quadrant of right breast in female, estrogen receptor positive ([place])   12/05/2016 Initial Diagnosis    Screening detected calcifications Rt breast spanning 1.5 cm, U/S measured a lesion 1.2 cm size, Biopsy revealed IDC Grade 3, ER/PR Negative, Her 2 Neg Ratio: 1.05; Ki 67: 20%; T1bN0 Stage 1A Clinical stage      12/24/2016 Genetic Testing    The patient had genetic testing due to a personal and family history of breast cancer.  The [facility] Common Hereditary Cancer Panel + Melanoma panel was sent out. The Hereditary Gene Panel + Melanoma Panel offered by [facility] includes sequencing and/or deletion duplication testing of the following 46 genes: APC, ATM, AXIN2, BAP1, BARD1, BMPR1A, BRCA1, BRCA2, BRIP1, CDH1, CDK4, CDKN2A (p14ARF), CDKN2A (p16INK4a), CHEK2, CTNNA1, DICER1, EPCAM (Deletion/duplication testing only), GREM1 (promoter region deletion/duplication testing only), KIT, MC1R, MEN1, MLH1, MSH2, MSH3, MSH6, MUTYH, NBN, NF1, NHTL1, PALB2, PDGFRA, PMS2, POLD1, POLE, POT1, PTEN, RAD50, RAD51C, RAD51D, RB1, SDHB, SDHC, SDHD, SMAD4, SMARCA4. STK11, TERT, TP53, TSC1, TSC2, and VHL.  The following genes were evaluated for sequence changes only: MITF, SDHA and HOXB13 c.251G>A variant only.  Results: No pathogenic mutations identified.  3 VU'sS in BRCA1 c.2967T>A (p.Phe989Leu) , MSH6 c.2857G>A (p.Glu953Lys), and TERT c.902G>A (p.Arg301His) were identified.  The date of this test result is 12/24/2016.          FAMILY HISTORY:  We obtained a detailed, 4-generation family history.  Significant diagnoses are listed below: Family History  Problem Relation Age of Onset  . Heart disease Father 61       CABG  . Breast cancer Maternal Aunt 60  . Lung cancer Maternal Aunt        60's. unsure if it was a primary LG or a met from breast recurrence.  She had a brain met as well.  She died at 14.  . Lung cancer Paternal Aunt        dx. late 64's, died in 26's.  Marland Kitchen Heart disease Paternal Grandmother   . Heart disease Paternal Grandfather    Lorraine Dean has no children or any siblings.  Her father is 20 with no history of cancer.  Lorraine Dean has 3 paternal uncles and 5 paternal aunts.  - 1 maternal aunt was diagnosed with lung cancer in her late 58's and died in her 58's.  She had no children. -1 maternal aunt is in her 57's and 64's with no history of cancer.  She has a daughter who was diagnosed with melanoma in her 56's and is now in her 22's/40's.  -Lorraine Dean has 2 paternal aunts and 1 paternal uncle who died in their 42's and 86's with no history of cancer -Lorraine Dean has 2 paternal uncles and 1 other paternal aunt who are in their 3's and 21's with no history of cancer.  Lorraine Dean has many paternal cousins.  She has  limited information about them, but does not know of any history of cancer.   Lorraine Dean paternal grandfather died in his 5's with no history of cancer.  He had heart disease. Lorraine Dean paternal grandmother died in her 70's with no history of cancer.  She had heart disease.    Lorraine Dean mother is 32 and has no history of cancer.  Lorraine Dean has 2 maternal aunts and 3 maternal uncles listed below: -1 maternal aunt died in her 72's with no history of cancer.  She had no children.  -1 maternal aunt  was diagnosed with breast cancer at 59.  She developed lung cancer after, and the patient is unsure if the lung cancer was a primary or a metastasis of the breast cancer.  The patient reports a cancer recurrence occurred and resulted in brain metastasis.  This aunt died at the age of 20.  She had no children.   -1 maternal uncle died in his 52's and had no children or any history of cancer.  -1 maternal uncle is in his late 18's with no history of cancer.  He has 4 children with no known history of cancer, although the patient has limited information about these cousins. -1 maternal uncle is in his late 23's with no history of cancer.  He has 3 children with no known history of cancer, although the patient has limited information about these cousins. Lorraine Dean maternal grandfather died in his 38's with no history of cancer. Lorraine Dean maternal grandmother died in her 85's with no history of cancer.   Lorraine Dean is unaware of previous family history of genetic testing for hereditary cancer risks. Patient's maternal ancestors are from the [place] descent, and paternal ancestors are from the [place]. There is no reported Ashkenazi Jewish ancestry. There is no known consanguinity.  GENETIC TEST RESULTS: Genetic testing performed through [facility]'s Common Hereditary Cancer Panel + Melanoma Panel reported out on 12/24/2016 showed no pathogenic mutations. The Hereditary Gene Panel + Melanoma Panel offered by [facility] includes sequencing and/or deletion duplication testing of the following 46 genes: APC, ATM, AXIN2, BAP1, BARD1, BMPR1A, BRCA1, BRCA2, BRIP1, CDH1, CDK4, CDKN2A (p14ARF), CDKN2A (p16INK4a), CHEK2, CTNNA1, DICER1, EPCAM (Deletion/duplication testing only), GREM1 (promoter region deletion/duplication testing only), KIT, MC1R, MEN1, MLH1, MSH2, MSH3, MSH6, MUTYH, NBN, NF1, NHTL1, PALB2, PDGFRA, PMS2, POLD1, POLE, POT1, PTEN, RAD50, RAD51C, RAD51D, RB1, SDHB, SDHC, SDHD, SMAD4, SMARCA4.  STK11, TERT, TP53, TSC1, TSC2, and VHL.  The following genes were evaluated for sequence changes only: MITF, SDHA and HOXB13 c.251G>A variant only.  A variant of uncertain significance (VUS) in a gene called BRCA1c.2967T>A (p.Phe989Leu),  was noted.  A variant of uncertain significance (VUS) in a gene called MSH6 c.2857G>A (p.Glu953Lys) was noted. A variant of uncertain significance (VUS) in a gene called TERT c.902G>A (p.Arg301His) was also noted.   The test report will be scanned into EPIC and will be located under the Molecular Pathology section of the Results Review tab.A portion of the result report is included below for reference.      We discussed with Lorraine Dean that because current genetic testing is not perfect, it is possible there may be a gene mutation in one of these genes that current testing cannot detect, but that chance is small. We also discussed, that there could be another gene that has not yet been discovered, or that we have not yet tested, that is responsible for the cancer diagnoses in the family. Therefore, it is important to  remain in touch with cancer genetics in the future so that we can continue to offer Lorraine Dean the most up to date genetic testing.   Regarding the VUS's in BRCA1, MSH6, and TERT: At this time, it is unknown if these variants are associated with increased cancer risk or if they are normal findings, but most variants such as these get reclassified to being inconsequential. They should not be used to make medical management decisions. With time, we suspect the lab will determine the significance of these variants, if any. If we do learn more about them, we will try to contact Lorraine Dean to discuss it further. However, it is important to stay in touch with [place] periodically and keep the address and phone number up to date.  ADDITIONAL GENETIC TESTING: We discussed with Lorraine Dean that there are other genes that are associated with increased cancer risk  that can be analyzed. The laboratories that offer this testing look at these additional genes via a hereditary cancer gene panel. Should Lorraine. Alfonzo wish to pursue additional genetic testing, we are happy to discuss and coordinate this testing, at any time.    CANCER SCREENING RECOMMENDATIONS: Based on these negative genetic test results, there areno additional cancer risks we are aware of for Lorraine. Alleman, and no additional screening or medical management that we would recommend for her at this time based on genetic testing.     This result is reassuring and indicates that it is unlikely Lorraine. Ambs has an increased risk for a future cancer due to a mutation in one of these genes. This normal test also suggests that Lorraine. Smoker's cancer was most likely not due to an inherited predisposition associated with one of these genes.  Most cancers happen by chance and this negative test suggests that her cancer may fall into this category.  Therefore, it is recommended she continue to follow the cancer management and screening guidelines provided by her oncology and primary healthcare provider. Other factors such as her personal and family history may still affect her cancer risk.   RECOMMENDATIONS FOR FAMILY MEMBERS: Women in this family might be at some increased risk of developing cancer, over the general population risk, simply due to the family history of cancer. We recommended women in this family have a yearly mammogram beginning at age 91, or 46 years younger than the earliest onset of cancer, an annual clinical breast exam, and perform monthly breast self-exams. Women in this family should also have a gynecological exam as recommended by their primary provider. All family members should have a colonoscopy by age 39.  FOLLOW-UP: Lastly, we discussed with Lorraine. Dargis that cancer genetics is a rapidly advancing field and it is possible that new genetic tests will be appropriate for her and/or her family  members in the future. We encouraged her to remain in contact with cancer genetics on an annual basis so we can update her personal and family histories and let her know of advances in cancer genetics that may benefit this family.   Our contact number was provided. Lorraine. Laver questions were answered to her satisfaction, and she knows she is welcome to call us at anytime with additional questions or concerns.   Ferol Luz, Lorraine Genetic Counselor [email or web address]

## 2016-12-26 ENCOUNTER — Telehealth: Payer: Self-pay | Admitting: Physician Assistant

## 2016-12-26 NOTE — Telephone Encounter (Signed)
Per pt spoke with Tammy  at [facility] and pt may proceed as planned with precedure ./cy

## 2016-12-26 NOTE — Telephone Encounter (Signed)
Lm for Lorraine Dean at [facility] to call back

## 2016-12-26 NOTE — Telephone Encounter (Signed)
Follow up    Pt is calling to follow up on clearance. She said they are considering canceling the surgery because of the test. Please call.

## 2016-12-26 NOTE — Telephone Encounter (Signed)
° °  [facility] Pre-operative Risk Assessment    Request for surgical clearance:  1. What type of surgery is being performed? Right Breat Lumpectomy  2. When is this surgery scheduled? Pending   3. Are there any medications that need to be held prior to surgery and how long?General Cardiac Clearance   4. Name of physician performing surgery? Dr Autumn Messing   5. What is your office phone and fax number? [phone] and fax is [phone]   Glyn Ade 12/26/2016, 10:15 AM  _________________________________________________________________   (provider comments below)

## 2016-12-26 NOTE — Pre-Procedure Instructions (Signed)
Patient here for pre surgery Ensure. Instructed to drink by 515 DOS

## 2016-12-26 NOTE — Telephone Encounter (Signed)
Returned pts call.  Pt has been advised that her sx can go on as planned, that Ermalinda Barrios, PA-C's note stated that she didn't need the testing before.  I have sent Dr. Gwendolyn Fill CMA, Trudee Kuster, @ [facility] a staff message to let her know.  Pt very thankful for the call back.

## 2016-12-27 ENCOUNTER — Ambulatory Visit (HOSPITAL_COMMUNITY): Payer: BLUE CROSS/BLUE SHIELD | Attending: Internal Medicine

## 2016-12-27 ENCOUNTER — Other Ambulatory Visit: Payer: Self-pay

## 2016-12-27 DIAGNOSIS — R011 Cardiac murmur, unspecified: Secondary | ICD-10-CM

## 2016-12-27 DIAGNOSIS — C50919 Malignant neoplasm of unspecified site of unspecified female breast: Secondary | ICD-10-CM | POA: Insufficient documentation

## 2016-12-27 DIAGNOSIS — R002 Palpitations: Secondary | ICD-10-CM | POA: Insufficient documentation

## 2016-12-27 HISTORY — DX: Cardiac murmur, unspecified: R01.1

## 2016-12-28 ENCOUNTER — Ambulatory Visit (HOSPITAL_BASED_OUTPATIENT_CLINIC_OR_DEPARTMENT_OTHER): Payer: BLUE CROSS/BLUE SHIELD | Admitting: Anesthesiology

## 2016-12-28 ENCOUNTER — Ambulatory Visit (HOSPITAL_BASED_OUTPATIENT_CLINIC_OR_DEPARTMENT_OTHER)
Admission: RE | Admit: 2016-12-28 | Discharge: 2016-12-28 | Disposition: A | Discharge status: Home / Self Care | Payer: BLUE CROSS/BLUE SHIELD | Source: Ambulatory Visit | Attending: General Surgery | Admitting: General Surgery

## 2016-12-28 ENCOUNTER — Other Ambulatory Visit (HOSPITAL_COMMUNITY): Payer: BLUE CROSS/BLUE SHIELD

## 2016-12-28 ENCOUNTER — Ambulatory Visit (HOSPITAL_COMMUNITY)
Admission: RE | Admit: 2016-12-28 | Discharge: 2016-12-28 | Disposition: A | Discharge status: Home / Self Care | Payer: BLUE CROSS/BLUE SHIELD | Source: Ambulatory Visit | Attending: General Surgery | Admitting: General Surgery

## 2016-12-28 ENCOUNTER — Encounter (HOSPITAL_BASED_OUTPATIENT_CLINIC_OR_DEPARTMENT_OTHER)
Admission: RE | Disposition: A | Discharge status: Home / Self Care | Payer: Self-pay | Source: Ambulatory Visit | Attending: General Surgery

## 2016-12-28 ENCOUNTER — Encounter (HOSPITAL_BASED_OUTPATIENT_CLINIC_OR_DEPARTMENT_OTHER): Payer: Self-pay | Admitting: *Deleted

## 2016-12-28 DIAGNOSIS — Z7982 Long term (current) use of aspirin: Secondary | ICD-10-CM | POA: Insufficient documentation

## 2016-12-28 DIAGNOSIS — Z171 Estrogen receptor negative status [ER-]: Principal | ICD-10-CM

## 2016-12-28 DIAGNOSIS — C50411 Malignant neoplasm of upper-outer quadrant of right female breast: Secondary | ICD-10-CM

## 2016-12-28 HISTORY — DX: Other specified postprocedural states: Z98.890

## 2016-12-28 HISTORY — DX: Chronic rhinitis: J31.0

## 2016-12-28 HISTORY — DX: Other specified postprocedural states: R11.2

## 2016-12-28 HISTORY — PX: BREAST LUMPECTOMY WITH RADIOACTIVE SEED AND SENTINEL LYMPH NODE BIOPSY: SHX6550

## 2016-12-28 SURGERY — BREAST LUMPECTOMY WITH RADIOACTIVE SEED AND SENTINEL LYMPH NODE BIOPSY
Anesthesia: General | Site: Breast | Laterality: Right

## 2016-12-28 MED ORDER — BUPIVACAINE HCL (PF) 0.5 % IJ SOLN
INTRAMUSCULAR | Status: AC
  Filled 2016-12-28: qty 60

## 2016-12-28 MED ORDER — GABAPENTIN 300 MG PO CAPS
ORAL_CAPSULE | ORAL | Status: AC
  Filled 2016-12-28: qty 1

## 2016-12-28 MED ORDER — ACETAMINOPHEN 500 MG PO TABS
1000.0000 mg | ORAL_TABLET | ORAL | Status: AC
  Administered 2016-12-28: 1000 mg via ORAL

## 2016-12-28 MED ORDER — LIDOCAINE HCL (CARDIAC) 20 MG/ML IV SOLN
INTRAVENOUS | Status: DC | PRN
  Administered 2016-12-28: 50 mg via INTRAVENOUS

## 2016-12-28 MED ORDER — ACETAMINOPHEN 500 MG PO TABS
ORAL_TABLET | ORAL | Status: AC
  Filled 2016-12-28: qty 2

## 2016-12-28 MED ORDER — CHLORHEXIDINE GLUCONATE CLOTH 2 % EX PADS: 6.0000 | MEDICATED_PAD | Freq: Once | CUTANEOUS | Status: DC

## 2016-12-28 MED ORDER — ONDANSETRON HCL 4 MG/2ML IJ SOLN
INTRAMUSCULAR | Status: AC
  Filled 2016-12-28: qty 2

## 2016-12-28 MED ORDER — 0.9 % SODIUM CHLORIDE (POUR BTL) OPTIME
TOPICAL | Status: DC | PRN
  Administered 2016-12-28: 200 mL

## 2016-12-28 MED ORDER — ONDANSETRON HCL 4 MG/2ML IJ SOLN
4.0000 mg | Freq: Once | INTRAMUSCULAR | Status: AC | PRN
  Administered 2016-12-28: 4 mg via INTRAVENOUS

## 2016-12-28 MED ORDER — FENTANYL CITRATE (PF) 100 MCG/2ML IJ SOLN
50.0000 ug | INTRAMUSCULAR | Status: AC | PRN
  Administered 2016-12-28 (×3): 50 ug via INTRAVENOUS

## 2016-12-28 MED ORDER — CEFAZOLIN SODIUM-DEXTROSE 2-4 GM/100ML-% IV SOLN
2.0000 g | INTRAVENOUS | Status: AC
  Administered 2016-12-28: 2 g via INTRAVENOUS

## 2016-12-28 MED ORDER — CEFAZOLIN SODIUM-DEXTROSE 2-4 GM/100ML-% IV SOLN
INTRAVENOUS | Status: AC
  Filled 2016-12-28: qty 100

## 2016-12-28 MED ORDER — MIDAZOLAM HCL 2 MG/2ML IJ SOLN
1.0000 mg | INTRAMUSCULAR | Status: DC | PRN
  Administered 2016-12-28: 1 mg via INTRAVENOUS
  Administered 2016-12-28: 2 mg via INTRAVENOUS

## 2016-12-28 MED ORDER — DEXAMETHASONE SODIUM PHOSPHATE 4 MG/ML IJ SOLN
INTRAMUSCULAR | Status: DC | PRN
  Administered 2016-12-28: 8 mg via INTRAVENOUS

## 2016-12-28 MED ORDER — FENTANYL CITRATE (PF) 100 MCG/2ML IJ SOLN
INTRAMUSCULAR | Status: AC
  Filled 2016-12-28: qty 2

## 2016-12-28 MED ORDER — SUCCINYLCHOLINE CHLORIDE 200 MG/10ML IV SOSY
PREFILLED_SYRINGE | INTRAVENOUS | Status: AC
  Filled 2016-12-28: qty 10

## 2016-12-28 MED ORDER — EPHEDRINE 5 MG/ML INJ
INTRAVENOUS | Status: AC
  Filled 2016-12-28: qty 10

## 2016-12-28 MED ORDER — MIDAZOLAM HCL 2 MG/2ML IJ SOLN
INTRAMUSCULAR | Status: AC
  Filled 2016-12-28: qty 2

## 2016-12-28 MED ORDER — ONDANSETRON HCL 4 MG/2ML IJ SOLN
INTRAMUSCULAR | Status: DC | PRN
  Administered 2016-12-28: 4 mg via INTRAVENOUS

## 2016-12-28 MED ORDER — LACTATED RINGERS IV SOLN
INTRAVENOUS | Status: DC
  Administered 2016-12-28 (×3): via INTRAVENOUS

## 2016-12-28 MED ORDER — CELECOXIB 200 MG PO CAPS
ORAL_CAPSULE | ORAL | Status: AC
  Filled 2016-12-28: qty 2

## 2016-12-28 MED ORDER — SODIUM CHLORIDE 0.9 % IJ SOLN
INTRAMUSCULAR | Status: AC
  Filled 2016-12-28: qty 20

## 2016-12-28 MED ORDER — LIDOCAINE 2% (20 MG/ML) 5 ML SYRINGE
INTRAMUSCULAR | Status: AC
  Filled 2016-12-28: qty 5

## 2016-12-28 MED ORDER — HYDROMORPHONE HCL 1 MG/ML IJ SOLN: 0.2500 mg | INTRAMUSCULAR | Status: DC | PRN

## 2016-12-28 MED ORDER — HYDROCODONE-ACETAMINOPHEN 5-325 MG PO TABS: 1.0000 | ORAL_TABLET | ORAL | 0 refills | Status: DC | PRN

## 2016-12-28 MED ORDER — GABAPENTIN 300 MG PO CAPS
300.0000 mg | ORAL_CAPSULE | ORAL | Status: AC
  Administered 2016-12-28: 300 mg via ORAL

## 2016-12-28 MED ORDER — TECHNETIUM TC 99M SULFUR COLLOID FILTERED
1.0000 | Freq: Once | INTRAVENOUS | Status: AC | PRN
  Administered 2016-12-28: 1 via INTRADERMAL

## 2016-12-28 MED ORDER — SCOPOLAMINE 1 MG/3DAYS TD PT72
1.0000 | MEDICATED_PATCH | Freq: Once | TRANSDERMAL | Status: DC | PRN
  Administered 2016-12-28: 1.5 mg via TRANSDERMAL

## 2016-12-28 MED ORDER — FENTANYL CITRATE (PF) 100 MCG/2ML IJ SOLN
INTRAMUSCULAR | Status: AC
Start: 2016-12-28 — End: 2016-12-28
  Filled 2016-12-28: qty 2

## 2016-12-28 MED ORDER — MEPERIDINE HCL 25 MG/ML IJ SOLN: 6.2500 mg | INTRAMUSCULAR | Status: DC | PRN

## 2016-12-28 MED ORDER — DEXAMETHASONE SODIUM PHOSPHATE 10 MG/ML IJ SOLN
INTRAMUSCULAR | Status: AC
  Filled 2016-12-28: qty 1

## 2016-12-28 MED ORDER — BUPIVACAINE-EPINEPHRINE (PF) 0.25% -1:200000 IJ SOLN
INTRAMUSCULAR | Status: AC
  Filled 2016-12-28: qty 60

## 2016-12-28 MED ORDER — PROPOFOL 10 MG/ML IV BOLUS
INTRAVENOUS | Status: DC | PRN
  Administered 2016-12-28: 150 mg via INTRAVENOUS

## 2016-12-28 MED ORDER — PHENYLEPHRINE 40 MCG/ML (10ML) SYRINGE FOR IV PUSH (FOR BLOOD PRESSURE SUPPORT)
PREFILLED_SYRINGE | INTRAVENOUS | Status: AC
  Filled 2016-12-28: qty 10

## 2016-12-28 MED ORDER — CELECOXIB 400 MG PO CAPS
400.0000 mg | ORAL_CAPSULE | ORAL | Status: AC
  Administered 2016-12-28: 400 mg via ORAL

## 2016-12-28 MED ORDER — BUPIVACAINE-EPINEPHRINE 0.25% -1:200000 IJ SOLN
INTRAMUSCULAR | Status: DC | PRN
  Administered 2016-12-28: 25 mL

## 2016-12-28 MED ORDER — METHYLENE BLUE 0.5 % INJ SOLN
INTRAVENOUS | Status: AC
  Filled 2016-12-28: qty 20

## 2016-12-28 MED ORDER — SCOPOLAMINE 1 MG/3DAYS TD PT72
MEDICATED_PATCH | TRANSDERMAL | Status: AC
  Filled 2016-12-28: qty 1

## 2016-12-28 SURGICAL SUPPLY — 44 items
APPLIER CLIP 9.375 MED OPEN (MISCELLANEOUS) ×2
BLADE SURG 15 STRL LF DISP TIS (BLADE) ×1 IMPLANT
BLADE SURG 15 STRL SS (BLADE) ×1
CANISTER SUC SOCK COL 7IN (MISCELLANEOUS) IMPLANT
CANISTER SUCT 1200ML W/VALVE (MISCELLANEOUS) ×2 IMPLANT
CHLORAPREP W/TINT 26ML (MISCELLANEOUS) ×2 IMPLANT
CLIP APPLIE 9.375 MED OPEN (MISCELLANEOUS) ×1 IMPLANT
COVER BACK TABLE 60X90IN (DRAPES) ×2 IMPLANT
COVER MAYO STAND STRL (DRAPES) ×2 IMPLANT
COVER PROBE W GEL 5X96 (DRAPES) ×2 IMPLANT
DECANTER SPIKE VIAL GLASS SM (MISCELLANEOUS) IMPLANT
DERMABOND ADVANCED (GAUZE/BANDAGES/DRESSINGS) ×1
DERMABOND ADVANCED .7 DNX12 (GAUZE/BANDAGES/DRESSINGS) ×1 IMPLANT
DEVICE DUBIN W/COMP PLATE 8390 (MISCELLANEOUS) ×2 IMPLANT
DRAPE LAPAROSCOPIC ABDOMINAL (DRAPES) ×2 IMPLANT
DRAPE UTILITY XL STRL (DRAPES) ×2 IMPLANT
ELECT COATED BLADE 2.86 ST (ELECTRODE) ×2 IMPLANT
ELECT REM PT RETURN 9FT ADLT (ELECTROSURGICAL) ×2
ELECTRODE REM PT RTRN 9FT ADLT (ELECTROSURGICAL) ×1 IMPLANT
GLOVE BIO SURGEON STRL SZ7.5 (GLOVE) ×2 IMPLANT
GLOVE BIOGEL PI IND STRL 7.0 (GLOVE) ×2 IMPLANT
GLOVE BIOGEL PI INDICATOR 7.0 (GLOVE) ×2
GLOVE SURG SS PI 6.5 STRL IVOR (GLOVE) ×2 IMPLANT
GOWN STRL REUS W/ TWL LRG LVL3 (GOWN DISPOSABLE) ×2 IMPLANT
GOWN STRL REUS W/TWL LRG LVL3 (GOWN DISPOSABLE) ×2
ILLUMINATOR WAVEGUIDE N/F (MISCELLANEOUS) IMPLANT
KIT MARKER MARGIN INK (KITS) ×2 IMPLANT
LIGHT WAVEGUIDE WIDE FLAT (MISCELLANEOUS) IMPLANT
NDL SAFETY ECLIPSE 18X1.5 (NEEDLE) IMPLANT
NEEDLE HYPO 18GX1.5 SHARP (NEEDLE)
NEEDLE HYPO 25X1 1.5 SAFETY (NEEDLE) ×2 IMPLANT
NS IRRIG 1000ML POUR BTL (IV SOLUTION) ×2 IMPLANT
PACK BASIN DAY SURGERY FS (CUSTOM PROCEDURE TRAY) ×2 IMPLANT
PENCIL BUTTON HOLSTER BLD 10FT (ELECTRODE) ×2 IMPLANT
SLEEVE SCD COMPRESS KNEE MED (MISCELLANEOUS) ×2 IMPLANT
SPONGE LAP 18X18 X RAY DECT (DISPOSABLE) ×2 IMPLANT
SUT MON AB 4-0 PC3 18 (SUTURE) ×4 IMPLANT
SUT SILK 2 0 SH (SUTURE) IMPLANT
SUT VICRYL 3-0 CR8 SH (SUTURE) ×2 IMPLANT
SYR CONTROL 10ML LL (SYRINGE) ×2 IMPLANT
TOWEL OR 17X24 6PK STRL BLUE (TOWEL DISPOSABLE) ×2 IMPLANT
TOWEL OR NON WOVEN STRL DISP B (DISPOSABLE) ×2 IMPLANT
TUBE CONNECTING 20X1/4 (TUBING) ×2 IMPLANT
YANKAUER SUCT BULB TIP NO VENT (SUCTIONS) ×2 IMPLANT

## 2016-12-28 NOTE — Anesthesia Procedure Notes (Signed)
Procedure Name: LMA Insertion Date/Time: 12/28/2016 9:30 AM Performed by: Melynda Ripple D Pre-anesthesia Checklist: Patient identified, Emergency Drugs available, Suction available and Patient being monitored Patient Re-evaluated:Patient Re-evaluated prior to induction Oxygen Delivery Method: Circle system utilized Preoxygenation: Pre-oxygenation with 100% oxygen Induction Type: IV induction Ventilation: Mask ventilation without difficulty LMA: LMA inserted LMA Size: 3.0 Number of attempts: 1 Airway Equipment and Method: Bite block Placement Confirmation: positive ETCO2 Tube secured with: Tape Dental Injury: Teeth and Oropharynx as per pre-operative assessment

## 2016-12-28 NOTE — Interval H&P Note (Signed)
History and Physical Interval Note:  12/28/2016 9:09 AM  Lorraine Dean  has presented today for surgery, with the diagnosis of right breast cancer  The various methods of treatment have been discussed with the patient and family. After consideration of risks, benefits and other options for treatment, the patient has consented to  Procedure(s) with comments: RIGHT BREAST LUMPECTOMY WITH RADIOACTIVE SEED AND SENTINEL Marshall (Right) - PEC BLOCK as a surgical intervention .  The patient's history has been reviewed, patient examined, no change in status, stable for surgery.  I have reviewed the patient's chart and labs.  Questions were answered to the patient's satisfaction.     TOTH III,Karmin Kasprzak S

## 2016-12-28 NOTE — H&P (Signed)
Lorraine Dean  Location: [facility] Patient #: [id number] DOB: 01/11/1966 Undefined / Language: [name] / Race: White Female   History of Present Illness  The patient is a 51 year old female who presents with breast cancer. we are asked to see the patient in consultation by Dr. Sondra Come to evaluate her for a new right breast cancer. The patient is a 51 year old Asian female who presents with an abnormal screening mammogram. She was found to have a 1.5 cm area of calcification in the lateral right breast. This was biopsied and came back as a grade 3 invasive ductal cancer. She was a triple negative with a Ki-67 of 5%. Her axilla was negative. She does not take any hormone replacement.  She does not smoke.   Past Surgical History  Breast Biopsy  Right. Oral Surgery   Diagnostic Studies History Colonoscopy  never Mammogram  within last year Pap Smear  1-5 years ago  Medication History  Medications Reconciled  Pregnancy / Birth History  Age at menarche  87 years. Contraceptive History  Oral contraceptives. Gravida  0 Irregular periods  Para  0  Other Problems  Depression  Hypercholesterolemia  Lump In Breast     Review of Systems  General Present- Fatigue and Night Sweats. Not Present- Appetite Loss, Chills, Fever, Weight Gain and Weight Loss. Skin Not Present- Change in Wart/Mole, Dryness, Hives, Jaundice, New Lesions, Non-Healing Wounds, Rash and Ulcer. HEENT Present- Wears glasses/contact lenses. Not Present- Earache, Hearing Loss, Hoarseness, Nose Bleed, Oral Ulcers, Ringing in the Ears, Seasonal Allergies, Sinus Pain, Sore Throat, Visual Disturbances and Yellow Eyes. Respiratory Not Present- Bloody sputum, Chronic Cough, Difficulty Breathing, Snoring and Wheezing. Breast Present- Breast Mass. Not Present- Breast Pain, Nipple Discharge and Skin Changes. Cardiovascular Not Present- Chest Pain, Difficulty Breathing Lying Down, Leg Cramps,  Palpitations, Rapid Heart Rate, Shortness of Breath and Swelling of Extremities. Gastrointestinal Not Present- Abdominal Pain, Bloating, Bloody Stool, Change in Bowel Habits, Chronic diarrhea, Constipation, Difficulty Swallowing, Excessive gas, Gets full quickly at meals, Hemorrhoids, Indigestion, Nausea, Rectal Pain and Vomiting. Female Genitourinary Present- Frequency and Urgency. Not Present- Nocturia, Painful Urination and Pelvic Pain. Neurological Not Present- Decreased Memory, Fainting, Headaches, Numbness, Seizures, Tingling, Tremor, Trouble walking and Weakness. Psychiatric Present- Depression. Not Present- Anxiety, Bipolar, Change in Sleep Pattern, Fearful and Frequent crying. Endocrine Not Present- Cold Intolerance, Excessive Hunger, Hair Changes, Heat Intolerance, Hot flashes and New Diabetes. Hematology Not Present- Blood Thinners, Easy Bruising, Excessive bleeding, Gland problems, HIV and Persistent Infections.   Physical Exam  General Mental Status-Alert. General Appearance-Consistent with stated age. Hydration-Well hydrated. Voice-Normal.  Head and Neck Head-normocephalic, atraumatic with no lesions or palpable masses. Trachea-midline. Thyroid Gland Characteristics - normal size and consistency.  Eye Eyeball - Bilateral-Extraocular movements intact. Sclera/Conjunctiva - Bilateral-No scleral icterus.  Chest and Lung Exam Chest and lung exam reveals -quiet, even and easy respiratory effort with no use of accessory muscles and on auscultation, normal breath sounds, no adventitious sounds and normal vocal resonance. Inspection Chest Wall - Normal. Back - normal.  Breast Note: the patient has small breasts with very dense symmetric tissue bilaterally. There is no palpable axillary, supraclavicular, or cervical lymphadenopathy.   Cardiovascular Cardiovascular examination reveals -normal heart sounds, regular rate and rhythm with no murmurs and normal  pedal pulses bilaterally.  Abdomen Inspection Inspection of the abdomen reveals - No Hernias. Skin - Scar - no surgical scars. Palpation/Percussion Palpation and Percussion of the abdomen reveal - Soft, Non Tender, No Rebound tenderness, No  Rigidity (guarding) and No hepatosplenomegaly. Auscultation Auscultation of the abdomen reveals - Bowel sounds normal.  Neurologic Neurologic evaluation reveals -alert and oriented x 3 with no impairment of recent or remote memory. Mental Status-Normal.  Musculoskeletal Normal Exam - Left-Upper Extremity Strength Normal and Lower Extremity Strength Normal. Normal Exam - Right-Upper Extremity Strength Normal and Lower Extremity Strength Normal.  Lymphatic Head & Neck  General Head & Neck Lymphatics: Bilateral - Description - Normal. Axillary  General Axillary Region: Bilateral - Description - Normal. Tenderness - Non Tender. Femoral & Inguinal  Generalized Femoral & Inguinal Lymphatics: Bilateral - Description - Normal. Tenderness - Non Tender.    Assessment & Plan MALIGNANT NEOPLASM OF UPPER-OUTER QUADRANT OF RIGHT BREAST IN FEMALE, ESTROGEN RECEPTOR NEGATIVE (C50.411) Impression: the patient appears to have a small area of invasive cancer in a small breast. I think it is still feasible for her to have breast conservation. I have discussed with her the different options for treatment and at this point she favors breast conservation. I would plan for a right breast radioactive seed localized lumpectomy and sentinel node mapping. I have discussed with her in detail the risks and benefits of the operation as well as some of the technical aspects and she understands and wishes to proceed. She does have a triple negative cancer but her growth rate marker is very low. The oncologist will wait for her final pathology before making a decision about chemotherapy. Current Plans Pt Education - Breast cancer: discussed with patient and provided  information.

## 2016-12-28 NOTE — Anesthesia Procedure Notes (Signed)
Anesthesia Regional Block: Pectoralis block   Pre-Anesthetic Checklist: ,, timeout performed, Correct Patient, Correct Site, Correct Laterality, Correct Procedure, Correct Position, site marked, Risks and benefits discussed,  Surgical consent,  Pre-op evaluation,  At surgeon's request and post-op pain management  Laterality: Right  Prep: chloraprep       Needles:  Injection technique: Single-shot     Needle Length: 9cm  Needle Gauge: 21     Additional Needles:   Procedures: ultrasound guided,,,,,,,,  Narrative:  Start time: 12/28/2016 8:30 AM End time: 12/28/2016 8:40 AM Injection made incrementally with aspirations every 5 mL.  Performed by: Personally  Anesthesiologist: Lillia Abed  Additional Notes: Monitors applied. Patient sedated. Sterile prep and drape,hand hygiene and sterile gloves were used. Relevant anatomy identified.Needle position confirmed.Local anesthetic injected incrementally after negative aspiration. Local anesthetic spread visualized. Vascular puncture avoided. No complications. Image printed for medical record.The patient tolerated the procedure well.

## 2016-12-28 NOTE — Anesthesia Preprocedure Evaluation (Signed)
Anesthesia Evaluation  Patient identified by MRN, date of birth, ID band Patient awake    Reviewed: Allergy & Precautions, NPO status , Patient's Chart, lab work & pertinent test results  History of Anesthesia Complications (+) PONV  Airway Mallampati: I  TM Distance: >3 FB Neck ROM: Full    Dental   Pulmonary    Pulmonary exam normal        Cardiovascular Normal cardiovascular exam     Neuro/Psych Anxiety Depression    GI/Hepatic   Endo/Other    Renal/GU      Musculoskeletal   Abdominal   Peds  Hematology   Anesthesia Other Findings   Reproductive/Obstetrics                             Anesthesia Physical Anesthesia Plan  ASA: II  Anesthesia Plan: General   Post-op Pain Management:  Regional for Post-op pain   Induction: Intravenous  PONV Risk Score and Plan: 3 and 4 or greater and Ondansetron, Dexamethasone, Midazolam, Scopolamine patch - Pre-op and Treatment may vary due to age or medical condition  Airway Management Planned: LMA  Additional Equipment:   Intra-op Plan:   Post-operative Plan: Extubation in OR  Informed Consent: I have reviewed the patients History and Physical, chart, labs and discussed the procedure including the risks, benefits and alternatives for the proposed anesthesia with the patient or authorized representative who has indicated his/her understanding and acceptance.     Plan Discussed with: CRNA and Surgeon  Anesthesia Plan Comments:         Anesthesia Quick Evaluation

## 2016-12-28 NOTE — Discharge Instructions (Signed)

## 2016-12-28 NOTE — Progress Notes (Signed)
Assisted Dr. Ossey with right, ultrasound guided, pectoralis block. Side rails up, monitors on throughout procedure. See vital signs in flow sheet. Tolerated Procedure well. 

## 2016-12-28 NOTE — Op Note (Signed)
12/28/2016  10:44 AM  PATIENT:  Lorraine Dean  51 y.o. female  PRE-OPERATIVE DIAGNOSIS:  Right breast cancer  POST-OPERATIVE DIAGNOSIS:  Right breast cancer  PROCEDURE:  Procedure(s) with comments: RIGHT BREAST LUMPECTOMY WITH RADIOACTIVE SEED AND DEEP RIGHT AXILLARY SENTINEL LYMPH NODE MAPPING   SURGEON:  Surgeon(s) and Role:    * [name], MD - Primary  PHYSICIAN ASSISTANT:   ASSISTANTS: none   ANESTHESIA:   local and general  EBL:  Total I/O In: 800 [I.V.:800] Out: 20 [Blood:20]  BLOOD ADMINISTERED:none  DRAINS: none   LOCAL MEDICATIONS USED:  MARCAINE     SPECIMEN:  Source of Specimen:  right breast tissue and sentinel nodes X 4  DISPOSITION OF SPECIMEN:  PATHOLOGY  COUNTS:  YES  TOURNIQUET:  * No tourniquets in log *  DICTATION: .Dragon Dictation   After informed consent was obtained the patient was brought to the operating room and placed in the supine position on the operating room table. After adequate induction of general anesthesia the patient's right chest, breast, and axillary area were prepped with ChloraPrep, allowed to dry, and draped in usual sterile manner. An appropriate timeout was performed. Previously an I-125 seed was placed in the lateral aspect of the right breast to mark an area of invasive breast cancer. Earlier in the day the patient underwent injection of 1 mCi of technetium sulfur colloid in the subareolar position on the right. The neoprobe was initially set to technetium in the area of radioactivity was readily identified in the right axilla. This area was infiltrated with quarter percent Marcaine. A small transversely oriented incision was made overlying the area of radioactivity with a 15 blade knife. The incision was carried through the skin and subcutaneous tissue sharply with electrocautery until the deep right axillary space was entered. The neoprobe was used to direct blunt hemostat dissection. I was able to identify 4 hot lymph  nodes. These lymph nodes were excised sharply with the electrocautery and the lymphatics and small vessels were controlled with clips. Ex vivo counts on these nodes ranged from 200 to 5500. These were sent as sentinel nodes numbers 1 through 4. Hemostasis was achieved using the Bovie electrocautery. The wound was irrigated with saline. The deep layer of the wound was closed with interrupted 3-0 Vicryl stitches. The skin was then closed with a running 4-0 Monocryl subcuticular stitch. Attention was then turned to the right breast. The neoprobe was set to I-125 in the area of radioactivity was readily identified in the lateral right breast. Because of the size of the area involved and because she had a very small breast I elected to make an elliptical incision in the skin overlying the area of radioactivity with a 15 blade knife. The incision was carried through the skin and subcutaneous tissue sharply with electrocautery. A circular portion of breast tissue was then excised sharply with the electrocautery around the radioactive seed while checking the area of radioactivity frequently with the neoprobe. This dissection was carried all the way to the chest wall. Once the specimen was removed it was oriented with the appropriate paint colors. A specimen radiograph was obtained that showed the clip and seed to be within the specimen. The calcifications also appeared to be within the specimen. The specimen was then sent to pathology for further evaluation. Hemostasis was achieved using the Bovie electrocautery. The cavity was marked with clips. The wound was irrigated with saline and infiltrated with more quarter percent Marcaine. The deep layer  of the wound was then closed with layers of interrupted 3-0 Vicryl stitches. The skin was then closed with a running 4-0 Monocryl subcuticular stitch. Dermabond dressings were applied. The patient tolerated the procedure well. At the end of the case all needle sponge and  instrument counts were correct. The patient was then awakened and taken to recovery in stable condition.  PLAN OF CARE: Discharge to home after PACU  PATIENT DISPOSITION:  PACU - hemodynamically stable.   Delay start of Pharmacological VTE agent (>24hrs) due to surgical blood loss or risk of bleeding: not applicable

## 2016-12-28 NOTE — Progress Notes (Signed)
Nuc med inj completed by nuc med staff. Pt tol well. Addiitonal fentanyl given for comfort (see MAR). Will call family to bedside and update/provide emotional support

## 2016-12-28 NOTE — Transfer of Care (Signed)
Immediate Anesthesia Transfer of Care Note  Patient: Lorraine Dean  Procedure(s) Performed: Procedure(s) with comments: RIGHT BREAST LUMPECTOMY WITH RADIOACTIVE SEED AND SENTINEL LYMPH NODE MAPPING ERAS PATHWAY (Right) - PEC BLOCK  Patient Location: PACU  Anesthesia Type:General  Level of Consciousness: sedated  Airway & Oxygen Therapy: Patient Spontanous Breathing and Patient connected to face mask oxygen  Post-op Assessment: Report given to RN and Post -op Vital signs reviewed and stable  Post vital signs: Reviewed and stable  Last Vitals:  Vitals:   12/28/16 0845 12/28/16 0850  BP: (!) 100/55 111/64  Pulse: 64 74  Resp: 16 (!) 21  Temp:    SpO2: 100% 100%    Last Pain:  Vitals:   12/28/16 0742  TempSrc: Oral         Complications: No apparent anesthesia complications

## 2016-12-28 NOTE — Anesthesia Postprocedure Evaluation (Signed)
Anesthesia Post Note  Patient: NYSSA SAYEGH  Procedure(s) Performed: Procedure(s) (LRB): RIGHT BREAST LUMPECTOMY WITH RADIOACTIVE SEED AND SENTINEL LYMPH NODE MAPPING ERAS PATHWAY (Right)     Patient location during evaluation: PACU Anesthesia Type: General Level of consciousness: awake and alert Pain management: pain level controlled Vital Signs Assessment: post-procedure vital signs reviewed and stable Respiratory status: spontaneous breathing, nonlabored ventilation, respiratory function stable and patient connected to nasal cannula oxygen Cardiovascular status: blood pressure returned to baseline and stable Postop Assessment: no signs of nausea or vomiting Anesthetic complications: no    Last Vitals:  Vitals:   12/28/16 1211 12/28/16 1242  BP: 132/80   Pulse: 66 64  Resp: 18 16  Temp: 36.4 C   SpO2: 100% 100%    Last Pain:  Vitals:   12/28/16 1242  TempSrc:   PainSc: 1                  Marily Konczal DAVID

## 2016-12-31 ENCOUNTER — Encounter (HOSPITAL_BASED_OUTPATIENT_CLINIC_OR_DEPARTMENT_OTHER): Payer: Self-pay | Admitting: General Surgery

## 2017-01-04 ENCOUNTER — Ambulatory Visit: Payer: Self-pay | Admitting: General Surgery

## 2017-01-04 ENCOUNTER — Ambulatory Visit (HOSPITAL_BASED_OUTPATIENT_CLINIC_OR_DEPARTMENT_OTHER): Payer: BLUE CROSS/BLUE SHIELD | Admitting: Hematology and Oncology

## 2017-01-04 DIAGNOSIS — Z170 Estrogen receptor positive status [ER+]: Secondary | ICD-10-CM | POA: Diagnosis not present

## 2017-01-04 DIAGNOSIS — C50411 Malignant neoplasm of upper-outer quadrant of right female breast: Secondary | ICD-10-CM

## 2017-01-04 NOTE — Assessment & Plan Note (Signed)
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 I discussed the margins as well as lymph node surgeries. We also discussed the final staging along with previously performed ER/PR and HER-2/neu testing.  Recommendation: 1. Adjuvant chemotherapy with dose dense Adriamycin and Cytoxan every 2 weeks 4 followed by weekly Taxol 12 2. followed by adjuvant radiation therapy  Plan: 1. Echocardiogram 2. port placement 3. Chemotherapy class Return to clinic in 3 weeks to start chemotherapy.

## 2017-01-04 NOTE — Progress Notes (Signed)
Patient Care Team: [name], MD as PCP - General (Family Medicine) Nicholas Lose, MD as Consulting Physician (Hematology and Oncology) [name], MD as Consulting Physician (General Surgery) Gery Pray, MD as Consulting Physician (Radiation Oncology)  DIAGNOSIS:  Encounter Diagnosis  Name Primary?  . Malignant neoplasm of upper-outer quadrant of right breast in female, estrogen receptor positive ([place])     SUMMARY OF ONCOLOGIC HISTORY:   Malignant neoplasm of upper-outer quadrant of right breast in female, estrogen receptor positive ([place])   12/05/2016 Initial Diagnosis    Screening detected calcifications Rt breast spanning 1.5 cm, U/S measured a lesion 1.2 cm size, Biopsy revealed IDC Grade 3, ER/PR Negative, Her 2 Neg Ratio: 1.05; Ki 67: 20%; T1bN0 Stage 1A Clinical stage      12/24/2016 Genetic Testing    The patient had genetic testing due to a personal and family history of breast cancer.  The [facility] Common Hereditary Cancer Panel + Melanoma panel was sent out. The Hereditary Gene Panel + Melanoma Panel offered by [facility] includes sequencing and/or deletion duplication testing of the following 53 genes: APC, ATM, AXIN2, BAP1, BARD1, BMPR1A, BRCA1, BRCA2, BRIP1, CDH1, CDK4, CDKN2A (p14ARF), CDKN2A (p16INK4a), CHEK2, CTNNA1, DICER1, EPCAM (Deletion/duplication testing only), GREM1 (promoter region deletion/duplication testing only), KIT, MC1R, MEN1, MLH1, MSH2, MSH3, MSH6, MUTYH, NBN, NF1, NHTL1, PALB2, PDGFRA, PMS2, POLD1, POLE, POT1, PTEN, RAD50, RAD51C, RAD51D, RB1, SDHB, SDHC, SDHD, SMAD4, SMARCA4. STK11, TERT, TP53, TSC1, TSC2, and VHL.  The following genes were evaluated for sequence changes only: MITF, SDHA and HOXB13 c.251G>A variant only.  Results: No pathogenic mutations identified. 3 VU'sS in BRCA1 c.2967T>A (p.Phe989Leu) , MSH6 c.2857G>A (p.Glu953Lys), and TERT c.902G>A (p.Arg301His) were identified.  The date of this test result is 12/24/2016.        12/28/2016  Surgery    Right lumpectomy: IDC grade 2, 1.2 cm, broadly 0.1 cm to the posterior margin, 0/5 lymph nodes negative, ER 0%, PR 0%, HER-2 negative ratio 1.05, Ki-67 5%, T1 cN0 stage IB AJCC 8       CHIEF COMPLIANT: Follow-up after right lumpectomy  INTERVAL HISTORY: Lorraine Dean is a 51 year old with above-mentioned history of right breast cancer underwent lumpectomy and is here today to discuss the final pathology report. She is healing very well from the recent surgery.  REVIEW OF SYSTEMS:   Constitutional: Denies fevers, chills or abnormal weight loss Eyes: Denies blurriness of vision Ears, nose, mouth, throat, and face: Denies mucositis or sore throat Respiratory: Denies cough, dyspnea or wheezes Cardiovascular: Denies palpitation, chest discomfort Gastrointestinal:  Denies nausea, heartburn or change in bowel habits Skin: Denies abnormal skin rashes Lymphatics: Denies new lymphadenopathy or easy bruising Neurological:Denies numbness, tingling or new weaknesses Behavioral/Psych: Mood is stable, no new changes  Extremities: No lower extremity edema Breast:  Recent right lumpectomy All other systems were reviewed with the patient and are negative.  I have reviewed the past medical history, past surgical history, social history and family history with the patient and they are unchanged from previous note.  ALLERGIES:  has No Known Allergies.  MEDICATIONS:  Current Outpatient Prescriptions  Medication Sig Dispense Refill  . albuterol (PROVENTIL HFA;VENTOLIN HFA) 108 (90 Base) MCG/ACT inhaler Inhale 1-2 puffs into the lungs every 4 (four) hours as needed for wheezing or shortness of breath.    Marland Kitchen aspirin EC 81 MG tablet Take 81 mg by mouth daily.    . Biotin 1000 MCG tablet Take 1,000 mcg by mouth 3 (three) times daily.    Marland Kitchen  calcium-vitamin D (OSCAL WITH D) 500-200 MG-UNIT tablet Take 1 tablet by mouth.    . fluticasone (FLONASE) 50 MCG/ACT nasal spray Place 1 spray into both  nostrils as needed for allergies or rhinitis.    Marland Kitchen HYDROcodone-acetaminophen (NORCO/VICODIN) 5-325 MG tablet Take 1-2 tablets by mouth every 4 (four) hours as needed for moderate pain or severe pain. 15 tablet 0  . Krill Oil 1000 MG CAPS Take by mouth.    . Multiple Vitamins-Minerals (OCUVITE ADULT FORMULA) CAPS Take by mouth.     No current facility-administered medications for this visit.     PHYSICAL EXAMINATION: ECOG PERFORMANCE STATUS: 0 - Asymptomatic  There were no vitals filed for this visit. There were no vitals filed for this visit.  GENERAL:alert, no distress and comfortable SKIN: skin color, texture, turgor are normal, no rashes or significant lesions EYES: normal, Conjunctiva are pink and non-injected, sclera clear OROPHARYNX:no exudate, no erythema and lips, buccal mucosa, and tongue normal  NECK: supple, thyroid normal size, non-tender, without nodularity LYMPH:  no palpable lymphadenopathy in the cervical, axillary or inguinal LUNGS: clear to auscultation and percussion with normal breathing effort HEART: regular rate & rhythm and no murmurs and no lower extremity edema ABDOMEN:abdomen soft, non-tender and normal bowel sounds MUSCULOSKELETAL:no cyanosis of digits and no clubbing  NEURO: alert & oriented x 3 with fluent speech, no focal motor/sensory deficits EXTREMITIES: No lower extremity edema  LABORATORY DATA:  I have reviewed the data as listed   Chemistry      Component Value Date/Time   NA 141 12/12/2016 1242   K 4.2 12/12/2016 1242   CO2 24 12/12/2016 1242   BUN 9.3 12/12/2016 1242   CREATININE 0.8 12/12/2016 1242      Component Value Date/Time   CALCIUM 9.7 12/12/2016 1242   ALKPHOS 47 12/12/2016 1242   AST 19 12/12/2016 1242   ALT 15 12/12/2016 1242   BILITOT 0.54 12/12/2016 1242       Lab Results  Component Value Date   WBC 7.3 12/12/2016   HGB 15.3 12/12/2016   HCT 45.6 12/12/2016   MCV 89.4 12/12/2016   PLT 210 12/12/2016   NEUTROABS  5.0 12/12/2016    ASSESSMENT & PLAN:  Malignant neoplasm of upper-outer quadrant of right breast in female, estrogen receptor positive ([place]) 12/28/2016: Right lumpectomy: IDC grade 2, 1.2 cm, broadly 0.1 cm to the posterior margin, 0/5 lymph nodes negative, ER 0%, PR 0%, HER-2 negative ratio 1.05, Ki-67 5%, T1 cN0 stage IB AJCC 8  Pathology counseling: I discussed the final pathology report of the patient provided  a copy of this report. I discussed the margins as well as lymph node surgeries. We also discussed the final staging along with previously performed ER/PR and HER-2/neu testing.  Recommendation: 1. Adjuvant chemotherapy with dose dense Adriamycin and Cytoxan every 2 weeks 4 followed by weekly Taxol 12 2. followed by adjuvant radiation therapy  Chemotherapy Counseling: I discussed the risks and benefits of chemotherapy including the risks of nausea/ vomiting, risk of infection from low WBC count, fatigue due to chemo or anemia, bruising or bleeding due to low platelets, mouth sores, loss/ change in taste and decreased appetite. Liver and kidney function will be monitored through out chemotherapy as abnormalities in liver and kidney function may be a side effect of treatment. Cardiac dysfunction due to Adriamycin was discussed in detail. Risk of permanent bone marrow dysfunction and leukemia due to chemo were also discussed.   Plan: 1. Echocardiogram 2. port  placement 3. Chemotherapy class Return to clinic in 3 weeks to start chemotherapy.  I spent 25 minutes talking to the patient of which more than half was spent in counseling and coordination of care.  No orders of the defined types were placed in this encounter.  The patient has a good understanding of the overall plan. she agrees with it. she will call with any problems that may develop before the next visit here.   Rulon Eisenmenger, MD 01/04/17

## 2017-01-11 ENCOUNTER — Other Ambulatory Visit: Payer: Self-pay | Admitting: Hematology and Oncology

## 2017-01-11 ENCOUNTER — Encounter: Payer: Self-pay | Admitting: General Practice

## 2017-01-11 ENCOUNTER — Other Ambulatory Visit: Payer: BLUE CROSS/BLUE SHIELD

## 2017-01-11 DIAGNOSIS — Z170 Estrogen receptor positive status [ER+]: Principal | ICD-10-CM

## 2017-01-11 DIAGNOSIS — C50411 Malignant neoplasm of upper-outer quadrant of right female breast: Secondary | ICD-10-CM

## 2017-01-11 MED ORDER — LIDOCAINE-PRILOCAINE 2.5-2.5 % EX CREA: TOPICAL_CREAM | CUTANEOUS | 3 refills | Status: DC

## 2017-01-11 MED ORDER — DEXAMETHASONE 4 MG PO TABS: 4.0000 mg | ORAL_TABLET | Freq: Every day | ORAL | 0 refills | Status: DC

## 2017-01-11 MED ORDER — LORAZEPAM 0.5 MG PO TABS: 0.5000 mg | ORAL_TABLET | Freq: Every day | ORAL | 0 refills | Status: DC

## 2017-01-11 MED ORDER — PROCHLORPERAZINE MALEATE 10 MG PO TABS: 10.0000 mg | ORAL_TABLET | Freq: Four times a day (QID) | ORAL | 1 refills | Status: DC | PRN

## 2017-01-11 MED ORDER — ONDANSETRON HCL 8 MG PO TABS: 8.0000 mg | ORAL_TABLET | Freq: Two times a day (BID) | ORAL | 1 refills | Status: DC | PRN
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
## 2017-01-11 NOTE — Progress Notes (Signed)
START ON PATHWAY REGIMEN - Breast   Doxorubicin + Cyclophosphamide (AC):   A cycle is every 21 days:     Doxorubicin      Cyclophosphamide   **Always confirm dose/schedule in your pharmacy ordering system**    Paclitaxel 80 mg/m2 Weekly:   Administer weekly:     Paclitaxel   **Always confirm dose/schedule in your pharmacy ordering system**    Patient Characteristics: Postoperative without Neoadjuvant Therapy (Pathologic Staging), Invasive Disease, Adjuvant Therapy, HER2 Negative/Unknown/Equivocal, ER Negative/Unknown, Node Negative Therapeutic Status: Postoperative without Neoadjuvant Therapy (Pathologic Staging) AJCC Grade: G2 AJCC N Category: pN0 AJCC M Category: cM0 ER Status: Negative (-) AJCC 8 Stage Grouping: IB HER2 Status: Negative (-) Oncotype Dx Recurrence Score: Not Appropriate AJCC T Category: pT1c PR Status: Negative (-) Intent of Therapy: Curative Intent, Discussed with Patient 

## 2017-01-14 ENCOUNTER — Encounter (HOSPITAL_BASED_OUTPATIENT_CLINIC_OR_DEPARTMENT_OTHER): Payer: Self-pay | Admitting: *Deleted

## 2017-01-15 ENCOUNTER — Encounter (HOSPITAL_BASED_OUTPATIENT_CLINIC_OR_DEPARTMENT_OTHER)
Admission: RE | Admit: 2017-01-15 | Discharge: 2017-01-15 | Disposition: A | Discharge status: Home / Self Care | Payer: BLUE CROSS/BLUE SHIELD | Source: Ambulatory Visit | Attending: General Surgery | Admitting: General Surgery

## 2017-01-15 DIAGNOSIS — F329 Major depressive disorder, single episode, unspecified: Secondary | ICD-10-CM | POA: Diagnosis not present

## 2017-01-15 DIAGNOSIS — Z171 Estrogen receptor negative status [ER-]: Secondary | ICD-10-CM | POA: Diagnosis not present

## 2017-01-15 DIAGNOSIS — E7800 Pure hypercholesterolemia, unspecified: Secondary | ICD-10-CM | POA: Diagnosis not present

## 2017-01-15 DIAGNOSIS — C50411 Malignant neoplasm of upper-outer quadrant of right female breast: Secondary | ICD-10-CM | POA: Diagnosis present

## 2017-01-15 LAB — POCT PREGNANCY, URINE: Preg Test, Ur: NEGATIVE

## 2017-01-15 NOTE — Progress Notes (Signed)
Pt in for PAT appt, UPT negative, Ensure given and instructions reviewed.

## 2017-01-16 ENCOUNTER — Encounter: Payer: Self-pay | Admitting: General Practice
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
## 2017-01-18 ENCOUNTER — Ambulatory Visit (HOSPITAL_COMMUNITY): Payer: BLUE CROSS/BLUE SHIELD

## 2017-01-18 ENCOUNTER — Ambulatory Visit (HOSPITAL_BASED_OUTPATIENT_CLINIC_OR_DEPARTMENT_OTHER): Payer: BLUE CROSS/BLUE SHIELD | Admitting: Certified Registered"

## 2017-01-18 ENCOUNTER — Ambulatory Visit (HOSPITAL_BASED_OUTPATIENT_CLINIC_OR_DEPARTMENT_OTHER)
Admission: RE | Admit: 2017-01-18 | Discharge: 2017-01-18 | Disposition: A | Discharge status: Home / Self Care | Payer: BLUE CROSS/BLUE SHIELD | Source: Ambulatory Visit | Attending: General Surgery | Admitting: General Surgery

## 2017-01-18 ENCOUNTER — Encounter (HOSPITAL_BASED_OUTPATIENT_CLINIC_OR_DEPARTMENT_OTHER): Payer: Self-pay | Admitting: *Deleted

## 2017-01-18 ENCOUNTER — Encounter (HOSPITAL_BASED_OUTPATIENT_CLINIC_OR_DEPARTMENT_OTHER)
Admission: RE | Disposition: A | Discharge status: Home / Self Care | Payer: Self-pay | Source: Ambulatory Visit | Attending: General Surgery

## 2017-01-18 DIAGNOSIS — Z171 Estrogen receptor negative status [ER-]: Secondary | ICD-10-CM | POA: Insufficient documentation

## 2017-01-18 DIAGNOSIS — C50411 Malignant neoplasm of upper-outer quadrant of right female breast: Secondary | ICD-10-CM | POA: Diagnosis not present

## 2017-01-18 DIAGNOSIS — E7800 Pure hypercholesterolemia, unspecified: Secondary | ICD-10-CM | POA: Insufficient documentation

## 2017-01-18 DIAGNOSIS — F329 Major depressive disorder, single episode, unspecified: Secondary | ICD-10-CM | POA: Insufficient documentation

## 2017-01-18 DIAGNOSIS — Z95828 Presence of other vascular implants and grafts: Secondary | ICD-10-CM

## 2017-01-18 DIAGNOSIS — Z419 Encounter for procedure for purposes other than remedying health state, unspecified: Secondary | ICD-10-CM

## 2017-01-18 HISTORY — DX: Malignant (primary) neoplasm, unspecified: C80.1

## 2017-01-18 HISTORY — PX: PORTACATH PLACEMENT: SHX2246

## 2017-01-18 SURGERY — INSERTION, TUNNELED CENTRAL VENOUS DEVICE, WITH PORT
Anesthesia: General | Site: Chest | Laterality: Left

## 2017-01-18 MED ORDER — CHLORHEXIDINE GLUCONATE CLOTH 2 % EX PADS: 6.0000 | MEDICATED_PAD | Freq: Once | CUTANEOUS | Status: DC

## 2017-01-18 MED ORDER — GABAPENTIN 300 MG PO CAPS
300.0000 mg | ORAL_CAPSULE | ORAL | Status: AC
  Administered 2017-01-18: 300 mg via ORAL

## 2017-01-18 MED ORDER — SCOPOLAMINE 1 MG/3DAYS TD PT72
1.0000 | MEDICATED_PATCH | Freq: Once | TRANSDERMAL | Status: DC | PRN
  Administered 2017-01-18: 1.5 mg via TRANSDERMAL

## 2017-01-18 MED ORDER — BUPIVACAINE-EPINEPHRINE 0.25% -1:200000 IJ SOLN
INTRAMUSCULAR | Status: DC | PRN
  Administered 2017-01-18: 10 mL

## 2017-01-18 MED ORDER — LIDOCAINE HCL (CARDIAC) 20 MG/ML IV SOLN
INTRAVENOUS | Status: DC | PRN
  Administered 2017-01-18: 60 mg via INTRAVENOUS

## 2017-01-18 MED ORDER — MIDAZOLAM HCL 2 MG/2ML IJ SOLN
INTRAMUSCULAR | Status: AC
  Filled 2017-01-18: qty 2

## 2017-01-18 MED ORDER — DEXAMETHASONE SODIUM PHOSPHATE 10 MG/ML IJ SOLN
INTRAMUSCULAR | Status: AC
  Filled 2017-01-18: qty 3

## 2017-01-18 MED ORDER — CEFAZOLIN SODIUM-DEXTROSE 2-4 GM/100ML-% IV SOLN
2.0000 g | INTRAVENOUS | Status: AC
  Administered 2017-01-18: 2 g via INTRAVENOUS

## 2017-01-18 MED ORDER — PROPOFOL 500 MG/50ML IV EMUL
INTRAVENOUS | Status: DC | PRN
  Administered 2017-01-18: 150 ug/kg/min via INTRAVENOUS

## 2017-01-18 MED ORDER — SCOPOLAMINE 1 MG/3DAYS TD PT72
MEDICATED_PATCH | TRANSDERMAL | Status: AC
  Filled 2017-01-18: qty 1

## 2017-01-18 MED ORDER — ACETAMINOPHEN 500 MG PO TABS
ORAL_TABLET | ORAL | Status: AC
  Filled 2017-01-18: qty 2

## 2017-01-18 MED ORDER — ACETAMINOPHEN 500 MG PO TABS
1000.0000 mg | ORAL_TABLET | ORAL | Status: AC
  Administered 2017-01-18: 1000 mg via ORAL

## 2017-01-18 MED ORDER — PROMETHAZINE HCL 12.5 MG PO TABS: 12.5000 mg | ORAL_TABLET | Freq: Four times a day (QID) | ORAL | 0 refills | Status: DC | PRN

## 2017-01-18 MED ORDER — HYDROCODONE-ACETAMINOPHEN 5-325 MG PO TABS: 1.0000 | ORAL_TABLET | ORAL | 0 refills | Status: DC | PRN

## 2017-01-18 MED ORDER — PROPOFOL 500 MG/50ML IV EMUL
INTRAVENOUS | Status: AC
  Filled 2017-01-18: qty 50

## 2017-01-18 MED ORDER — CELECOXIB 200 MG PO CAPS
ORAL_CAPSULE | ORAL | Status: AC
  Filled 2017-01-18: qty 2

## 2017-01-18 MED ORDER — OXYCODONE HCL 5 MG PO TABS
5.0000 mg | ORAL_TABLET | Freq: Once | ORAL | Status: AC | PRN
  Administered 2017-01-18: 5 mg via ORAL

## 2017-01-18 MED ORDER — LACTATED RINGERS IV SOLN
INTRAVENOUS | Status: DC
  Administered 2017-01-18 (×2): via INTRAVENOUS

## 2017-01-18 MED ORDER — OXYCODONE HCL 5 MG/5ML PO SOLN: 5.0000 mg | Freq: Once | ORAL | Status: AC | PRN

## 2017-01-18 MED ORDER — GABAPENTIN 300 MG PO CAPS
ORAL_CAPSULE | ORAL | Status: AC
  Filled 2017-01-18: qty 1

## 2017-01-18 MED ORDER — MIDAZOLAM HCL 2 MG/2ML IJ SOLN
1.0000 mg | INTRAMUSCULAR | Status: DC | PRN
  Administered 2017-01-18: 2 mg via INTRAVENOUS

## 2017-01-18 MED ORDER — HEPARIN (PORCINE) IN NACL 2-0.9 UNIT/ML-% IJ SOLN
INTRAMUSCULAR | Status: AC | PRN
  Administered 2017-01-18: 1

## 2017-01-18 MED ORDER — PROPOFOL 10 MG/ML IV BOLUS
INTRAVENOUS | Status: DC | PRN
  Administered 2017-01-18: 100 mg via INTRAVENOUS

## 2017-01-18 MED ORDER — CEFAZOLIN SODIUM-DEXTROSE 2-4 GM/100ML-% IV SOLN
INTRAVENOUS | Status: AC
  Filled 2017-01-18: qty 100

## 2017-01-18 MED ORDER — FENTANYL CITRATE (PF) 100 MCG/2ML IJ SOLN
50.0000 ug | INTRAMUSCULAR | Status: DC | PRN
  Administered 2017-01-18: 25 ug via INTRAVENOUS

## 2017-01-18 MED ORDER — OXYCODONE HCL 5 MG PO TABS
ORAL_TABLET | ORAL | Status: AC
  Filled 2017-01-18: qty 1

## 2017-01-18 MED ORDER — ONDANSETRON HCL 4 MG/2ML IJ SOLN
INTRAMUSCULAR | Status: AC
  Filled 2017-01-18: qty 12

## 2017-01-18 MED ORDER — HEPARIN SOD (PORK) LOCK FLUSH 100 UNIT/ML IV SOLN
INTRAVENOUS | Status: DC | PRN
  Administered 2017-01-18: 400 [IU]

## 2017-01-18 MED ORDER — DEXAMETHASONE SODIUM PHOSPHATE 4 MG/ML IJ SOLN
INTRAMUSCULAR | Status: DC | PRN
  Administered 2017-01-18: 10 mg via INTRAVENOUS

## 2017-01-18 MED ORDER — HYDROMORPHONE HCL 1 MG/ML IJ SOLN: 0.2500 mg | INTRAMUSCULAR | Status: DC | PRN

## 2017-01-18 MED ORDER — EPHEDRINE 5 MG/ML INJ
INTRAVENOUS | Status: AC
  Filled 2017-01-18: qty 10

## 2017-01-18 MED ORDER — ONDANSETRON HCL 4 MG/2ML IJ SOLN
INTRAMUSCULAR | Status: DC | PRN
  Administered 2017-01-18: 4 mg via INTRAVENOUS

## 2017-01-18 MED ORDER — LIDOCAINE 2% (20 MG/ML) 5 ML SYRINGE
INTRAMUSCULAR | Status: AC
  Filled 2017-01-18: qty 15

## 2017-01-18 MED ORDER — CELECOXIB 400 MG PO CAPS
400.0000 mg | ORAL_CAPSULE | ORAL | Status: AC
  Administered 2017-01-18: 400 mg via ORAL

## 2017-01-18 MED ORDER — FENTANYL CITRATE (PF) 100 MCG/2ML IJ SOLN
INTRAMUSCULAR | Status: AC
  Filled 2017-01-18: qty 2

## 2017-01-18 SURGICAL SUPPLY — 46 items
BAG DECANTER FOR FLEXI CONT (MISCELLANEOUS) ×2 IMPLANT
BLADE SURG 15 STRL LF DISP TIS (BLADE) ×1 IMPLANT
BLADE SURG 15 STRL SS (BLADE) ×1
CANISTER SUCT 1200ML W/VALVE (MISCELLANEOUS) IMPLANT
CHLORAPREP W/TINT 26ML (MISCELLANEOUS) ×2 IMPLANT
CLEANER CAUTERY TIP 5X5 PAD (MISCELLANEOUS) ×1 IMPLANT
COVER BACK TABLE 60X90IN (DRAPES) ×2 IMPLANT
COVER MAYO STAND STRL (DRAPES) ×2 IMPLANT
DECANTER SPIKE VIAL GLASS SM (MISCELLANEOUS) IMPLANT
DERMABOND ADVANCED (GAUZE/BANDAGES/DRESSINGS) ×1
DERMABOND ADVANCED .7 DNX12 (GAUZE/BANDAGES/DRESSINGS) ×1 IMPLANT
DRAPE C-ARM 42X72 X-RAY (DRAPES) ×2 IMPLANT
DRAPE LAPAROSCOPIC ABDOMINAL (DRAPES) ×2 IMPLANT
DRAPE UTILITY XL STRL (DRAPES) ×2 IMPLANT
ELECT REM PT RETURN 9FT ADLT (ELECTROSURGICAL) ×2
ELECTRODE REM PT RTRN 9FT ADLT (ELECTROSURGICAL) ×1 IMPLANT
GLOVE BIO SURGEON STRL SZ 6.5 (GLOVE) ×4 IMPLANT
GLOVE BIO SURGEON STRL SZ7.5 (GLOVE) ×2 IMPLANT
GLOVE BIOGEL PI IND STRL 7.0 (GLOVE) ×2 IMPLANT
GLOVE BIOGEL PI INDICATOR 7.0 (GLOVE) ×2
GOWN STRL REUS W/ TWL LRG LVL3 (GOWN DISPOSABLE) ×3 IMPLANT
GOWN STRL REUS W/TWL LRG LVL3 (GOWN DISPOSABLE) ×3
IV KIT MINILOC 20X1 SAFETY (NEEDLE) IMPLANT
KIT PORT POWER 8FR ISP CVUE (Miscellaneous) ×2 IMPLANT
NDL SAFETY ECLIPSE 18X1.5 (NEEDLE) IMPLANT
NEEDLE HYPO 18GX1.5 SHARP (NEEDLE)
NEEDLE HYPO 22GX1.5 SAFETY (NEEDLE) ×2 IMPLANT
NEEDLE HYPO 25X1 1.5 SAFETY (NEEDLE) ×2 IMPLANT
NEEDLE SPNL 22GX3.5 QUINCKE BK (NEEDLE) IMPLANT
PACK BASIN DAY SURGERY FS (CUSTOM PROCEDURE TRAY) ×2 IMPLANT
PAD CLEANER CAUTERY TIP 5X5 (MISCELLANEOUS) ×1
PENCIL BUTTON HOLSTER BLD 10FT (ELECTRODE) ×2 IMPLANT
SLEEVE SCD COMPRESS KNEE MED (MISCELLANEOUS) ×2 IMPLANT
SUT MON AB 4-0 PC3 18 (SUTURE) ×2 IMPLANT
SUT PROLENE 2 0 SH DA (SUTURE) ×2 IMPLANT
SUT SILK 2 0 TIES 17X18 (SUTURE)
SUT SILK 2-0 18XBRD TIE BLK (SUTURE) IMPLANT
SUT VIC AB 3-0 SH 27 (SUTURE) ×1
SUT VIC AB 3-0 SH 27X BRD (SUTURE) ×1 IMPLANT
SYR 10ML LL (SYRINGE) ×2 IMPLANT
SYR 5ML LL (SYRINGE) ×2 IMPLANT
SYR CONTROL 10ML LL (SYRINGE) ×4 IMPLANT
TOWEL OR 17X24 6PK STRL BLUE (TOWEL DISPOSABLE) ×4 IMPLANT
TOWEL OR NON WOVEN STRL DISP B (DISPOSABLE) ×2 IMPLANT
TUBE CONNECTING 20X1/4 (TUBING) IMPLANT
YANKAUER SUCT BULB TIP NO VENT (SUCTIONS) IMPLANT

## 2017-01-18 NOTE — H&P (Signed)
Titus Mould  Location: [facility] Patient #: [id number] DOB: October 12, 1965 Undefined / Language: [name] / Race: White Female   History of Present Illness  The patient is a 51 year old female who presents with breast cancer. we are asked to see the patient in consultation by Dr. Sondra Come to evaluate her for a new right breast cancer. The patient is a 51 year old Asian female who presents with an abnormal screening mammogram. She was found to have a 1.5 cm area of calcification in the lateral right breast. This wasabiopncer.and came back as a grade 3 invasive ductal cancer. She was a triple negative with a Ki-67 of 5%. Her axilla was negative. She denihe does nke.take any hormone replacement. She does not smoke.take any hormone replacement. She does not smoke.   Past Surgical History  Breast Biopsy  Right. Oral Surgery   Diagnostic Studies History Colonoscopy  never Mammogram  within last year Pap Smear  1-5 years ago  Medication History  Medications Reconciled  Pregnancy / Birth History Age at menarche  63 years. Contraceptive History  Oral contraceptives. Gravida  0 Irregular periods  Para  0  Other Problems  Depression  Hypercholesterolemia  Lump In Breast     Review of Systems General Present- Fatigue and Night Sweats. Not Present- Appetite Loss, Chills, Fever, Weight Gain and Weight Loss. Skin Not Present- Change in Wart/Mole, Dryness, Hives, Jaundice, New Lesions, Non-Healing Wounds, Rash and Ulcer. HEENT Present- Wears glasses/contact lenses. Not Present- Earache, Hearing Loss, Hoarseness, Nose Bleed, Oral Ulcers, Ringing in the Ears, Seasonal Allergies, Sinus Pain, Sore Throat, Visual Disturbances and Yellow Eyes. Respiratory Not Present- Bloody sputum, Chronic Cough, Difficulty Breathing, Snoring and Wheezing. Breast Present- Breast Mass. Not Present- Breast Pain, Nipple Discharge and Skin Changes. Cardiovascular Not Present- Chest Pain,  Difficulty Breathing Lying Down, Leg Cramps, Palpitations, Rapid Heart Rate, Shortness of Breath and Swelling of Extremities. Gastrointestinal Not Present- Abdominal Pain, Bloating, Bloody Stool, Change in Bowel Habits, Chronic diarrhea, Constipation, Difficulty Swallowing, Excessive gas, Gets full quickly at meals, Hemorrhoids, Indigestion, Nausea, Rectal Pain and Vomiting. Female Genitourinary Present- Frequency and Urgency. Not Present- Nocturia, Painful Urination and Pelvic Pain. Neurological Not Present- Decreased Memory, Fainting, Headaches, Numbness, Seizures, Tingling, Tremor, Trouble walking and Weakness. Psychiatric Present- Depression. Not Present- Anxiety, Bipolar, Change in Sleep Pattern, Fearful and Frequent crying. Endocrine Not Present- Cold Intolerance, Excessive Hunger, Hair Changes, Heat Intolerance, Hot flashes and New Diabetes. Hematology Not Present- Blood Thinners, Easy Bruising, Excessive bleeding, Gland problems, HIV and Persistent Infections.   Physical Exam General Mental Status-Alert. General Appearance-Consistent with stated age. Hydration-Well hydrated. Voice-Normal.  Head and Neck Head-normocephalic, atraumatic with no lesions or palpable masses. Trachea-midline. Thyroid Gland Characteristics - normal size and consistency.  Eye Eyeball - Bilateral-Extraocular movements intact. Sclera/Conjunctiva - Bilateral-No scleral icterus.  Chest and Lung Exam Chest and lung exam reveals -quiet, even and easy respiratory effort with no use of accessory muscles and on auscultation, normal breath sounds, no adventitious sounds and normal vocal resonance. Inspection Chest Wall - Normal. Back - normal.  Breast Note: the patient has small breasts with very dense symmetric tissue bilaterally. There is no palpable axillary, supraclavicular, or cervical lymphadenopathy.   Cardiovascular Cardiovascular examination reveals -normal heart sounds, regular  rate and rhythm with no murmurs and normal pedal pulses bilaterally.  Abdomen Inspection Inspection of the abdomen reveals - No Hernias. Skin - Scar - no surgical scars. Palpation/Percussion Palpation and Percussion of the abdomen reveal - Soft, Non Tender, No Rebound tenderness,  No Rigidity (guarding) and No hepatosplenomegaly. Auscultation Auscultation of the abdomen reveals - Bowel sounds normal.  Neurologic Neurologic evaluation reveals -alert and oriented x 3 with no impairment of recent or remote memory. Mental Status-Normal.  Musculoskeletal Normal Exam - Left-Upper Extremity Strength Normal and Lower Extremity Strength Normal. Normal Exam - Right-Upper Extremity Strength Normal and Lower Extremity Strength Normal.  Lymphatic Head & Neck  General Head & Neck Lymphatics: Bilateral - Description - Normal. Axillary  General Axillary Region: Bilateral - Description - Normal. Tenderness - Non Tender. Femoral & Inguinal  Generalized Femoral & Inguinal Lymphatics: Bilateral - Description - Normal. Tenderness - Non Tender.    Assessment & Plan  MALIGNANT NEOPLASM OF UPPER-OUTER QUADRANT OF RIGHT BREAST IN FEMALE, ESTROGEN RECEPTOR NEGATIVE (C50.411) Impression: the patient appears to have a small area of invasive cancer in a small breast. I think it is still feasible for her to have breast conservation. I have discussed with her the different options for treatment and at this point she favors breast conservation. I would plan for a right breast radioactive seed localized lumpectomy and sentinel node mapping. I have discussed with her in detail the risks and benefits of the operation as well as some of the technical aspects and she understands and wishes to proceed. She does have a triple negative cancer but her growth rate marker is very low. The oncologist will wait for her final pathology before making a decision about chemotherapy. Current Plans Pt Education - Breast  cancer: discussed with patient and provided information.  The oncologists have elected to treat her with chemotherapy. She will need a port. I have discussed with her in detail the risks and benefits of the surgery as well as some of the technical aspects and she understands and wishes to proceed

## 2017-01-18 NOTE — Interval H&P Note (Signed)
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 After consideration of risks, benefits and other options for treatment, the patient has consented to  Procedure(s): INSERTION PORT-A-CATH (N/A) as a surgical intervention .  The patient's history has been reviewed, patient examined, no change in status, stable for surgery.  I have reviewed the patient's chart and labs.  Questions were answered to the patient's satisfaction.     TOTH III,PAUL S

## 2017-01-18 NOTE — Transfer of Care (Signed)
Immediate Anesthesia Transfer of Care Note  Patient: Lorraine Dean  Procedure(s) Performed: Procedure(s): INSERTION PORT-A-CATH (Left)  Patient Location: PACU  Anesthesia Type:General  Level of Consciousness: awake, alert , oriented and patient cooperative  Airway & Oxygen Therapy: Patient Spontanous Breathing and Patient connected to face mask oxygen  Post-op Assessment: Report given to RN and Post -op Vital signs reviewed and stable  Post vital signs: Reviewed and stable  Last Vitals:  Vitals:   01/18/17 1132  BP: (!) 140/55  Pulse: 94  Resp: 18  Temp: 36.8 C  SpO2: 99%    Last Pain:  Vitals:   01/18/17 1132  TempSrc: Oral  PainSc: 5       Patients Stated Pain Goal: 2 (20/25/42 7062)  Complications: No apparent anesthesia complications

## 2017-01-18 NOTE — Anesthesia Procedure Notes (Signed)
Procedure Name: LMA Insertion Date/Time: 01/18/2017 12:34 PM Performed by: Jacquez Sheetz D Pre-anesthesia Checklist: Patient identified, Emergency Drugs available, Suction available and Patient being monitored Patient Re-evaluated:Patient Re-evaluated prior to induction Oxygen Delivery Method: Circle system utilized Preoxygenation: Pre-oxygenation with 100% oxygen Induction Type: IV induction Ventilation: Mask ventilation without difficulty LMA: LMA inserted LMA Size: 3.0 Number of attempts: 1 Airway Equipment and Method: Bite block Placement Confirmation: positive ETCO2 Tube secured with: Tape Dental Injury: Teeth and Oropharynx as per pre-operative assessment

## 2017-01-18 NOTE — Anesthesia Preprocedure Evaluation (Signed)
Anesthesia Evaluation  Patient identified by MRN, date of birth, ID band Patient awake    Reviewed: Allergy & Precautions, NPO status , Patient's Chart, lab work & pertinent test results  History of Anesthesia Complications (+) PONV and history of anesthetic complications  Airway Mallampati: II       Dental   Pulmonary neg pulmonary ROS,    breath sounds clear to auscultation       Cardiovascular negative cardio ROS   Rhythm:Regular Rate:Normal     Neuro/Psych negative neurological ROS     GI/Hepatic negative GI ROS, Neg liver ROS,   Endo/Other  negative endocrine ROS  Renal/GU negative Renal ROS     Musculoskeletal negative musculoskeletal ROS (+)   Abdominal   Peds  Hematology negative hematology ROS (+)   Anesthesia Other Findings   Reproductive/Obstetrics                             Anesthesia Physical Anesthesia Plan  ASA: II  Anesthesia Plan: General   Post-op Pain Management:    Induction: Intravenous  PONV Risk Score and Plan: 4 or greater and Ondansetron, Dexamethasone, Midazolam, Scopolamine patch - Pre-op and Treatment may vary due to age or medical condition  Airway Management Planned: LMA  Additional Equipment:   Intra-op Plan:   Post-operative Plan: Extubation in OR  Informed Consent: I have reviewed the patients History and Physical, chart, labs and discussed the procedure including the risks, benefits and alternatives for the proposed anesthesia with the patient or authorized representative who has indicated his/her understanding and acceptance.     Plan Discussed with: CRNA  Anesthesia Plan Comments:         Anesthesia Quick Evaluation

## 2017-01-18 NOTE — Anesthesia Postprocedure Evaluation (Signed)
Anesthesia Post Note  Patient: Lorraine Dean  Procedure(s) Performed: Procedure(s) (LRB): INSERTION PORT-A-CATH (Left)     Patient location during evaluation: PACU Anesthesia Type: General Level of consciousness: awake and alert Pain management: pain level controlled Vital Signs Assessment: post-procedure vital signs reviewed and stable Respiratory status: spontaneous breathing, nonlabored ventilation, respiratory function stable and patient connected to nasal cannula oxygen Cardiovascular status: blood pressure returned to baseline and stable Postop Assessment: no apparent nausea or vomiting Anesthetic complications: no    Last Vitals:  Vitals:   01/18/17 1132 01/18/17 1330  BP: (!) 140/55 (!) 82/66  Pulse: 94 66  Resp: 18 16  Temp: 36.8 C (!) (P) 36.4 C  SpO2: 99% 100%    Last Pain:  Vitals:   01/18/17 1330  TempSrc:   PainSc: (P) Asleep                 Montez Hageman

## 2017-01-18 NOTE — Discharge Instructions (Signed)

## 2017-01-18 NOTE — Op Note (Signed)
01/18/2017  1:30 PM  PATIENT:  Lorraine Dean  51 y.o. female  PRE-OPERATIVE DIAGNOSIS:  RIGHT BREAST CANCER AND POOR VENOUS ACCESS  POST-OPERATIVE DIAGNOSIS:  RIGHT BREAST CANCER AND POOR VENOUS ACCESS  PROCEDURE:  Procedure(s): INSERTION PORT-A-CATH (Left IJ)  SURGEON:  Surgeon(s) and Role:    * [name], MD - Primary  PHYSICIAN ASSISTANT:   ASSISTANTS: none   ANESTHESIA:   local and general  EBL:  Total I/O In: 1000 [I.V.:1000] Out: -   BLOOD ADMINISTERED:none  DRAINS: none   LOCAL MEDICATIONS USED:  MARCAINE     SPECIMEN:  No Specimen  DISPOSITION OF SPECIMEN:  N/A  COUNTS:  YES  TOURNIQUET:  * No tourniquets in log *  DICTATION: .Dragon Dictation   After informed consent was obtained patient was brought to the operating room and placed in the supine position on the operating table. After adequate induction of general anesthesia a roll was placed between the patient's shoulder blades to extend the shoulder slightly. Next the left chest and neck area were prepped with ChloraPrep, allowed to dry, and draped in usual sterile manner. The patient was placed in Trendelenburg position. An appropriate timeout was performed. The area lateral to the bend of the clavicle and the left chest wall was infiltrated with quarter percent Marcaine. A large bore needle from the port kit was used to slide beneath the bend of the clavicle heading towards the sternal notch. I was never able to access the subclavian vein and kept hitting the rib. After several attempts I decided to move to the left internal jugular vein. I was able to identify the jugular vein using a 10 mL syringe and 22-gauge needle. I then used the large bore needle from the port kit to access the vein without difficulty. A wire was fed through the needle using the Seldinger technique without difficulty. The wire was confirmed in the central venous system using real-time fluoroscopy. Next a small incision was made on  the left chest wall with a 15 blade knife. The incision was carried to the skin and subcutaneous tissue sharply with electrocautery. A subcutaneous pocket was created inferior to this incision by blunt finger dissection. Next a hemostat and tunneling device were used to bluntly connect this incision to a small incision in the neck at the wire entry site. Next the tubing was brought through this subcutaneous tunnel. The tubing was placed on the reservoir and the reservoir was placed in the pocket. The length of the tubing was again estimated using real-time fluoroscopy. The tubing was cut to the appropriate length. Next a sheath and dilator were fed over the wire using the Seldinger technique without difficulty. The dilator and wire were removed. The tubing was fed through the sheath as far as it would go and then held in place while the sheath was gently cracked and separated. Another real-time fluoroscopy image at this point showed the tip of the catheter to be in the distal superior vena cava. The tubing was then permanently anchored to the reservoir. The reservoir was anchored in the pocket with 2 2-0 Prolene stitches. The port was then aspirated and it aspirated blood easily. The port was then flushed initially with a dilute heparin solution and then with a more concentrated heparin solution. The subcutaneous tissue was closed over the port with interrupted 3-0 Vicryl stitches. The skin incision at the neck was closed with an interrupted 4-0 Monocryl subcuticular stitch. The incision on the left chest wall was  closed with a running 4-0 Monocryl subcuticular stitch. Dermabond dressings were applied. The patient tolerated the procedure well. At the end of the case all needle sponge and instrument counts were correct. The patient was then awakened and taken to recovery in stable condition.  PLAN OF CARE: Discharge to home after PACU  PATIENT DISPOSITION:  PACU - hemodynamically stable.   Delay start of  Pharmacological VTE agent (>24hrs) due to surgical blood loss or risk of bleeding: not applicable

## 2017-01-21 ENCOUNTER — Encounter (HOSPITAL_BASED_OUTPATIENT_CLINIC_OR_DEPARTMENT_OTHER): Payer: Self-pay | Admitting: General Surgery

## 2017-01-21 ENCOUNTER — Telehealth: Payer: Self-pay

## 2017-01-21 NOTE — Telephone Encounter (Signed)
Pt called to ask if it is OK to get flu shot on 9/20 and have chemo on 9/21. This is first cycle AC with onpro. Instructed pt that generally Dr Lindi Adie is OK with pt getting flu shot. That said it is better to wait until after 1st cycle of chemo to see how she responds to chemo without the added medicine of the flu shot.

## 2017-01-23 ENCOUNTER — Encounter: Payer: Self-pay | Admitting: General Practice

## 2017-01-23 NOTE — Progress Notes (Signed)
[facility] Spiritual Care Note  Followed up with Kendrick Fries by phone for further support.  Per pt, the port surgery went well and she seems to be healing fine; being back at work is a relief because it offers some distraction from worrying about the unknown (first infusion Friday).  We talked about how chemo treatment is only new the first time--after that, the routine will be known and become familiar.  She found it helpful to be reminded that the current anxiety is temporary, not permanent, and that her feelings will likely change over time.  We plan to keep in touch by phone and/or when she is on campus, but please also page if needs arise.  Thank you.  [place], [place], [facility] Pager [phone] Voicemail [phone]

## 2017-01-24 NOTE — Assessment & Plan Note (Signed)
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 I discussed the margins as well as lymph node surgeries. We also discussed the final staging along with previously performed ER/PR and HER-2/neu testing.  Recommendation: 1. Adjuvant chemotherapy with dose dense Adriamycin and Cytoxan every 2 weeks 4 followed by weekly Taxol 12 2. followed by adjuvant radiation therapy ---------------------------------------------------------------------- Current treatment: Cycle 1 day 1 dose dense Adriamycin Cytoxan Antiemetics were reviewed Chemotherapy consent obtained Chemotherapy education completed Echocardiogram 12/27/16: EF 60-65% Closely monitoring for chemotherapy toxicities. Return to clinic in one week for toxicity check

## 2017-01-25 ENCOUNTER — Encounter: Payer: Self-pay | Admitting: *Deleted

## 2017-01-25 ENCOUNTER — Ambulatory Visit (HOSPITAL_BASED_OUTPATIENT_CLINIC_OR_DEPARTMENT_OTHER): Payer: BLUE CROSS/BLUE SHIELD | Admitting: Hematology and Oncology

## 2017-01-25 ENCOUNTER — Ambulatory Visit (HOSPITAL_BASED_OUTPATIENT_CLINIC_OR_DEPARTMENT_OTHER): Payer: BLUE CROSS/BLUE SHIELD

## 2017-01-25 ENCOUNTER — Other Ambulatory Visit (HOSPITAL_BASED_OUTPATIENT_CLINIC_OR_DEPARTMENT_OTHER): Payer: BLUE CROSS/BLUE SHIELD

## 2017-01-25 ENCOUNTER — Ambulatory Visit: Payer: BLUE CROSS/BLUE SHIELD

## 2017-01-25 VITALS — BP 124/48 | HR 80 | Temp 98.0°F | Resp 18

## 2017-01-25 DIAGNOSIS — Z170 Estrogen receptor positive status [ER+]: Principal | ICD-10-CM

## 2017-01-25 DIAGNOSIS — Z5111 Encounter for antineoplastic chemotherapy: Secondary | ICD-10-CM

## 2017-01-25 DIAGNOSIS — C50411 Malignant neoplasm of upper-outer quadrant of right female breast: Secondary | ICD-10-CM

## 2017-01-25 DIAGNOSIS — Z95828 Presence of other vascular implants and grafts: Secondary | ICD-10-CM

## 2017-01-25 LAB — CBC WITH DIFFERENTIAL/PLATELET
BASO%: 0.4 % (ref 0.0–2.0)
BASOS ABS: 0 10*3/uL (ref 0.0–0.1)
EOS ABS: 0.1 10*3/uL (ref 0.0–0.5)
EOS%: 1.2 % (ref 0.0–7.0)
HEMATOCRIT: 42.1 % (ref 34.8–46.6)
HGB: 14.3 g/dL (ref 11.6–15.9)
LYMPH#: 1.2 10*3/uL (ref 0.9–3.3)
LYMPH%: 14.8 % (ref 14.0–49.7)
MCH: 30.2 pg (ref 25.1–34.0)
MCHC: 34 g/dL (ref 31.5–36.0)
MCV: 88.8 fL (ref 79.5–101.0)
MONO#: 0.5 10*3/uL (ref 0.1–0.9)
MONO%: 6 % (ref 0.0–14.0)
NEUT#: 6.4 10*3/uL (ref 1.5–6.5)
NEUT%: 77.6 % — AB (ref 38.4–76.8)
PLATELETS: 173 10*3/uL (ref 145–400)
RBC: 4.74 10*6/uL (ref 3.70–5.45)
RDW: 12.8 % (ref 11.2–14.5)
WBC: 8.2 10*3/uL (ref 3.9–10.3)

## 2017-01-25 LAB — COMPREHENSIVE METABOLIC PANEL
ALT: 20 U/L (ref 0–55)
AST: 20 U/L (ref 5–34)
Albumin: 4.2 g/dL (ref 3.5–5.0)
Alkaline Phosphatase: 64 U/L (ref 40–150)
Anion Gap: 10 mEq/L (ref 3–11)
BILIRUBIN TOTAL: 0.45 mg/dL (ref 0.20–1.20)
BUN: 8.3 mg/dL (ref 7.0–26.0)
CALCIUM: 9.6 mg/dL (ref 8.4–10.4)
CHLORIDE: 103 meq/L (ref 98–109)
CO2: 24 meq/L (ref 22–29)
CREATININE: 0.7 mg/dL (ref 0.6–1.1)
Glucose: 97 mg/dl (ref 70–140)
Potassium: 3.8 mEq/L (ref 3.5–5.1)
Sodium: 138 mEq/L (ref 136–145)
Total Protein: 7.7 g/dL (ref 6.4–8.3)

## 2017-01-25 MED ORDER — SODIUM CHLORIDE 0.9 % IV SOLN
Freq: Once | INTRAVENOUS | Status: AC
  Administered 2017-01-25: 13:00:00 via INTRAVENOUS

## 2017-01-25 MED ORDER — CYCLOPHOSPHAMIDE CHEMO INJECTION 1 GM
600.0000 mg/m2 | Freq: Once | INTRAMUSCULAR | Status: AC
  Administered 2017-01-25: 860 mg via INTRAVENOUS
  Filled 2017-01-25: qty 43

## 2017-01-25 MED ORDER — HEPARIN SOD (PORK) LOCK FLUSH 100 UNIT/ML IV SOLN
500.0000 [IU] | Freq: Once | INTRAVENOUS | Status: AC | PRN
  Administered 2017-01-25: 500 [IU]
  Filled 2017-01-25: qty 5

## 2017-01-25 MED ORDER — SODIUM CHLORIDE 0.9% FLUSH
10.0000 mL | INTRAVENOUS | Status: DC | PRN
  Administered 2017-01-25: 10 mL
  Filled 2017-01-25: qty 10

## 2017-01-25 MED ORDER — PALONOSETRON HCL INJECTION 0.25 MG/5ML
0.2500 mg | Freq: Once | INTRAVENOUS | Status: AC
  Administered 2017-01-25: 0.25 mg via INTRAVENOUS

## 2017-01-25 MED ORDER — DOXORUBICIN HCL CHEMO IV INJECTION 2 MG/ML
60.0000 mg/m2 | Freq: Once | INTRAVENOUS | Status: AC
  Administered 2017-01-25: 86 mg via INTRAVENOUS
  Filled 2017-01-25: qty 43

## 2017-01-25 MED ORDER — SODIUM CHLORIDE 0.9% FLUSH
10.0000 mL | INTRAVENOUS | Status: DC | PRN
Start: 2017-01-25 — End: 2017-01-25
  Administered 2017-01-25: 10 mL via INTRAVENOUS
  Filled 2017-01-25: qty 10

## 2017-01-25 MED ORDER — PALONOSETRON HCL INJECTION 0.25 MG/5ML
INTRAVENOUS | Status: AC
  Filled 2017-01-25: qty 5

## 2017-01-25 MED ORDER — SODIUM CHLORIDE 0.9 % IV SOLN
Freq: Once | INTRAVENOUS | Status: AC
  Administered 2017-01-25: 13:00:00 via INTRAVENOUS
  Filled 2017-01-25: qty 5

## 2017-01-25 MED ORDER — PEGFILGRASTIM 6 MG/0.6ML ~~LOC~~ PSKT
6.0000 mg | PREFILLED_SYRINGE | Freq: Once | SUBCUTANEOUS | Status: AC
  Administered 2017-01-25: 6 mg via SUBCUTANEOUS
  Filled 2017-01-25: qty 0.6

## 2017-01-25 NOTE — Patient Instructions (Signed)
[facility] Discharge Instructions for Patients Receiving Chemotherapy  Today you received the following chemotherapy agents: Adriamycin and Cytoxan.  To help prevent nausea and vomiting after your treatment, we encourage you to take your nausea medication Compazine as directed. May take Zofran in 72 hours after treatment.   If you develop nausea and vomiting that is not controlled by your nausea medication, call the clinic.   BELOW ARE SYMPTOMS THAT SHOULD BE REPORTED IMMEDIATELY:  *FEVER GREATER THAN 100.5 F  *CHILLS WITH OR WITHOUT FEVER  NAUSEA AND VOMITING THAT IS NOT CONTROLLED WITH YOUR NAUSEA MEDICATION  *UNUSUAL SHORTNESS OF BREATH  *UNUSUAL BRUISING OR BLEEDING  TENDERNESS IN MOUTH AND THROAT WITH OR WITHOUT PRESENCE OF ULCERS  *URINARY PROBLEMS  *BOWEL PROBLEMS  UNUSUAL RASH Items with * indicate a potential emergency and should be followed up as soon as possible.  Feel free to call the clinic should you have any questions or concerns. The clinic phone number is [phone].  Please show the [place] at check-in to the Emergency Department and triage nurse.  Doxorubicin injection What is this medicine? DOXORUBICIN (dox oh ROO bi sin) is a chemotherapy drug. It is used to treat many kinds of cancer like leukemia, lymphoma, neuroblastoma, sarcoma, and Wilms' tumor. It is also used to treat bladder cancer, breast cancer, lung cancer, ovarian cancer, stomach cancer, and thyroid cancer. This medicine may be used for other purposes; ask your health care provider or pharmacist if you have questions. COMMON BRAND NAME(S): Adriamycin, Adriamycin PFS, Adriamycin RDF, Rubex What should I tell my health care provider before I take this medicine? They need to know if you have any of these conditions: -heart disease -history of low blood counts caused by a medicine -liver disease -recent or ongoing radiation therapy -an unusual or allergic reaction  to doxorubicin, other chemotherapy agents, other medicines, foods, dyes, or preservatives -pregnant or trying to get pregnant -breast-feeding How should I use this medicine? This drug is given as an infusion into a vein. It is administered in a hospital or clinic by a specially trained health care professional. If you have pain, swelling, burning or any unusual feeling around the site of your injection, tell your health care professional right away. Talk to your pediatrician regarding the use of this medicine in children. Special care may be needed. Overdosage: If you think you have taken too much of this medicine contact a poison control center or emergency room at once. NOTE: This medicine is only for you. Do not share this medicine with others. What if I miss a dose? It is important not to miss your dose. Call your doctor or health care professional if you are unable to keep an appointment. What may interact with this medicine? This medicine may interact with the following medications: -6-mercaptopurine -paclitaxel -phenytoin -St. John's Wort -trastuzumab -verapamil This list may not describe all possible interactions. Give your health care provider a list of all the medicines, herbs, non-prescription drugs, or dietary supplements you use. Also tell them if you smoke, drink alcohol, or use illegal drugs. Some items may interact with your medicine. What should I watch for while using this medicine? This drug may make you feel generally unwell. This is not uncommon, as chemotherapy can affect healthy cells as well as cancer cells. Report any side effects. Continue your course of treatment even though you feel ill unless your doctor tells you to stop. There is a maximum amount of this medicine you  should receive throughout your life. The amount depends on the medical condition being treated and your overall health. Your doctor will watch how much of this medicine you receive in your lifetime.  Tell your doctor if you have taken this medicine before. You may need blood work done while you are taking this medicine. Your urine may turn red for a few days after your dose. This is not blood. If your urine is dark or brown, call your doctor. In some cases, you may be given additional medicines to help with side effects. Follow all directions for their use. Call your doctor or health care professional for advice if you get a fever, chills or sore throat, or other symptoms of a cold or flu. Do not treat yourself. This drug decreases your body's ability to fight infections. Try to avoid being around people who are sick. This medicine may increase your risk to bruise or bleed. Call your doctor or health care professional if you notice any unusual bleeding. Talk to your doctor about your risk of cancer. You may be more at risk for certain types of cancers if you take this medicine. Do not become pregnant while taking this medicine or for 6 months after stopping it. Women should inform their doctor if they wish to become pregnant or think they might be pregnant. Men should not father a child while taking this medicine and for 6 months after stopping it. There is a potential for serious side effects to an unborn child. Talk to your health care professional or pharmacist for more information. Do not breast-feed an infant while taking this medicine. This medicine has caused ovarian failure in some women and reduced sperm counts in some men This medicine may interfere with the ability to have a child. Talk with your doctor or health care professional if you are concerned about your fertility. What side effects may I notice from receiving this medicine? Side effects that you should report to your doctor or health care professional as soon as possible: -allergic reactions like skin rash, itching or hives, swelling of the face, lips, or tongue -breathing problems -chest pain -fast or irregular heartbeat -low  blood counts - this medicine may decrease the number of white blood cells, red blood cells and platelets. You may be at increased risk for infections and bleeding. -pain, redness, or irritation at site where injected -signs of infection - fever or chills, cough, sore throat, pain or difficulty passing urine -signs of decreased platelets or bleeding - bruising, pinpoint red spots on the skin, black, tarry stools, blood in the urine -swelling of the ankles, feet, hands -tiredness -weakness Side effects that usually do not require medical attention (report to your doctor or health care professional if they continue or are bothersome): -diarrhea -hair loss -mouth sores -nail discoloration or damage -nausea -red colored urine -vomiting This list may not describe all possible side effects. Call your doctor for medical advice about side effects. You may report side effects to FDA at 1-800-FDA-1088. Where should I keep my medicine? This drug is given in a hospital or clinic and will not be stored at home. NOTE: This sheet is a summary. It may not cover all possible information. If you have questions about this medicine, talk to your doctor, pharmacist, or health care provider.  2018 Elsevier/Gold Standard (2015-06-20 11:28:51) Cyclophosphamide injection What is this medicine? CYCLOPHOSPHAMIDE (sye kloe FOSS fa mide) is a chemotherapy drug. It slows the growth of cancer cells. This medicine is used  to treat many types of cancer like lymphoma, myeloma, leukemia, breast cancer, and ovarian cancer, to name a few. This medicine may be used for other purposes; ask your health care provider or pharmacist if you have questions. COMMON BRAND NAME(S): Cytoxan, Neosar What should I tell my health care provider before I take this medicine? They need to know if you have any of these conditions: -blood disorders -history of other chemotherapy -infection -kidney disease -liver disease -recent or ongoing  radiation therapy -tumors in the bone marrow -an unusual or allergic reaction to cyclophosphamide, other chemotherapy, other medicines, foods, dyes, or preservatives -pregnant or trying to get pregnant -breast-feeding How should I use this medicine? This drug is usually given as an injection into a vein or muscle or by infusion into a vein. It is administered in a hospital or clinic by a specially trained health care professional. Talk to your pediatrician regarding the use of this medicine in children. Special care may be needed. Overdosage: If you think you have taken too much of this medicine contact a poison control center or emergency room at once. NOTE: This medicine is only for you. Do not share this medicine with others. What if I miss a dose? It is important not to miss your dose. Call your doctor or health care professional if you are unable to keep an appointment. What may interact with this medicine? This medicine may interact with the following medications: -amiodarone -amphotericin B -azathioprine -certain antiviral medicines for HIV or AIDS such as protease inhibitors (e.g., indinavir, ritonavir) and zidovudine -certain blood pressure medications such as benazepril, captopril, enalapril, fosinopril, lisinopril, moexipril, monopril, perindopril, quinapril, ramipril, trandolapril -certain cancer medications such as anthracyclines (e.g., daunorubicin, doxorubicin), busulfan, cytarabine, paclitaxel, pentostatin, tamoxifen, trastuzumab -certain diuretics such as chlorothiazide, chlorthalidone, hydrochlorothiazide, indapamide, metolazone -certain medicines that treat or prevent blood clots like warfarin -certain muscle relaxants such as succinylcholine -cyclosporine -etanercept -indomethacin -medicines to increase blood counts like filgrastim, pegfilgrastim, sargramostim -medicines used as general anesthesia -metronidazole -natalizumab This list may not describe all possible  interactions. Give your health care provider a list of all the medicines, herbs, non-prescription drugs, or dietary supplements you use. Also tell them if you smoke, drink alcohol, or use illegal drugs. Some items may interact with your medicine. What should I watch for while using this medicine? Visit your doctor for checks on your progress. This drug may make you feel generally unwell. This is not uncommon, as chemotherapy can affect healthy cells as well as cancer cells. Report any side effects. Continue your course of treatment even though you feel ill unless your doctor tells you to stop. Drink water or other fluids as directed. Urinate often, even at night. In some cases, you may be given additional medicines to help with side effects. Follow all directions for their use. Call your doctor or health care professional for advice if you get a fever, chills or sore throat, or other symptoms of a cold or flu. Do not treat yourself. This drug decreases your body's ability to fight infections. Try to avoid being around people who are sick. This medicine may increase your risk to bruise or bleed. Call your doctor or health care professional if you notice any unusual bleeding. Be careful brushing and flossing your teeth or using a toothpick because you may get an infection or bleed more easily. If you have any dental work done, tell your dentist you are receiving this medicine. You may get drowsy or dizzy. Do not drive, use  machinery, or do anything that needs mental alertness until you know how this medicine affects you. Do not become pregnant while taking this medicine or for 1 year after stopping it. Women should inform their doctor if they wish to become pregnant or think they might be pregnant. Men should not father a child while taking this medicine and for 4 months after stopping it. There is a potential for serious side effects to an unborn child. Talk to your health care professional or pharmacist for  more information. Do not breast-feed an infant while taking this medicine. This medicine may interfere with the ability to have a child. This medicine has caused ovarian failure in some women. This medicine has caused reduced sperm counts in some men. You should talk with your doctor or health care professional if you are concerned about your fertility. If you are going to have surgery, tell your doctor or health care professional that you have taken this medicine. What side effects may I notice from receiving this medicine? Side effects that you should report to your doctor or health care professional as soon as possible: -allergic reactions like skin rash, itching or hives, swelling of the face, lips, or tongue -low blood counts - this medicine may decrease the number of white blood cells, red blood cells and platelets. You may be at increased risk for infections and bleeding. -signs of infection - fever or chills, cough, sore throat, pain or difficulty passing urine -signs of decreased platelets or bleeding - bruising, pinpoint red spots on the skin, black, tarry stools, blood in the urine -signs of decreased red blood cells - unusually weak or tired, fainting spells, lightheadedness -breathing problems -dark urine -dizziness -palpitations -swelling of the ankles, feet, hands -trouble passing urine or change in the amount of urine -weight gain -yellowing of the eyes or skin Side effects that usually do not require medical attention (report to your doctor or health care professional if they continue or are bothersome): -changes in nail or skin color -hair loss -missed menstrual periods -mouth sores -nausea, vomiting This list may not describe all possible side effects. Call your doctor for medical advice about side effects. You may report side effects to FDA at 1-800-FDA-1088. Where should I keep my medicine? This drug is given in a hospital or clinic and will not be stored at  home. NOTE: This sheet is a summary. It may not cover all possible information. If you have questions about this medicine, talk to your doctor, pharmacist, or health care provider.  2018 Elsevier/Gold Standard (2012-03-07 16:22:58) Pegfilgrastim injection What is this medicine? PEGFILGRASTIM (PEG fil gra stim) is a long-acting granulocyte colony-stimulating factor that stimulates the growth of neutrophils, a type of white blood cell important in the body's fight against infection. It is used to reduce the incidence of fever and infection in patients with certain types of cancer who are receiving chemotherapy that affects the bone marrow, and to increase survival after being exposed to high doses of radiation. This medicine may be used for other purposes; ask your health care provider or pharmacist if you have questions. COMMON BRAND NAME(S): Neulasta What should I tell my health care provider before I take this medicine? They need to know if you have any of these conditions: -kidney disease -latex allergy -ongoing radiation therapy -sickle cell disease -skin reactions to acrylic adhesives (On-Body Injector only) -an unusual or allergic reaction to pegfilgrastim, filgrastim, other medicines, foods, dyes, or preservatives -pregnant or trying to get pregnant -breast-feeding  How should I use this medicine? This medicine is for injection under the skin. If you get this medicine at home, you will be taught how to prepare and give the pre-filled syringe or how to use the On-body Injector. Refer to the patient Instructions for Use for detailed instructions. Use exactly as directed. Tell your healthcare provider immediately if you suspect that the On-body Injector may not have performed as intended or if you suspect the use of the On-body Injector resulted in a missed or partial dose. It is important that you put your used needles and syringes in a special sharps container. Do not put them in a trash  can. If you do not have a sharps container, call your pharmacist or healthcare provider to get one. Talk to your pediatrician regarding the use of this medicine in children. While this drug may be prescribed for selected conditions, precautions do apply. Overdosage: If you think you have taken too much of this medicine contact a poison control center or emergency room at once. NOTE: This medicine is only for you. Do not share this medicine with others. What if I miss a dose? It is important not to miss your dose. Call your doctor or health care professional if you miss your dose. If you miss a dose due to an On-body Injector failure or leakage, a new dose should be administered as soon as possible using a single prefilled syringe for manual use. What may interact with this medicine? Interactions have not been studied. Give your health care provider a list of all the medicines, herbs, non-prescription drugs, or dietary supplements you use. Also tell them if you smoke, drink alcohol, or use illegal drugs. Some items may interact with your medicine. This list may not describe all possible interactions. Give your health care provider a list of all the medicines, herbs, non-prescription drugs, or dietary supplements you use. Also tell them if you smoke, drink alcohol, or use illegal drugs. Some items may interact with your medicine. What should I watch for while using this medicine? You may need blood work done while you are taking this medicine. If you are going to need a MRI, CT scan, or other procedure, tell your doctor that you are using this medicine (On-Body Injector only). What side effects may I notice from receiving this medicine? Side effects that you should report to your doctor or health care professional as soon as possible: -allergic reactions like skin rash, itching or hives, swelling of the face, lips, or tongue -dizziness -fever -pain, redness, or irritation at site where  injected -pinpoint red spots on the skin -red or dark-brown urine -shortness of breath or breathing problems -stomach or side pain, or pain at the shoulder -swelling -tiredness -trouble passing urine or change in the amount of urine Side effects that usually do not require medical attention (report to your doctor or health care professional if they continue or are bothersome): -bone pain -muscle pain This list may not describe all possible side effects. Call your doctor for medical advice about side effects. You may report side effects to FDA at 1-800-FDA-1088. Where should I keep my medicine? Keep out of the reach of children. Store pre-filled syringes in a refrigerator between 2 and 8 degrees C (36 and 46 degrees F). Do not freeze. Keep in carton to protect from light. Throw away this medicine if it is left out of the refrigerator for more than 48 hours. Throw away any unused medicine after the expiration date. NOTE:  This sheet is a summary. It may not cover all possible information. If you have questions about this medicine, talk to your doctor, pharmacist, or health care provider.  2018 Elsevier/Gold Standard (2016-04-19 12:58:03)

## 2017-01-25 NOTE — Progress Notes (Signed)
At 1430 complained of feeling light headed, Cytoxan paused. Vital signs obtained blood pressure-116/53,  Pulse- 81. Restarted Cytoxan after vital signs obtained, denies light headed feeling. At 1450 Cytoxan completed, complaining of headache and just anxious about today.  Vital signs obtained prior to d/c, see flow sheet.

## 2017-01-25 NOTE — Progress Notes (Signed)
Patient Care Team: [name], MD as PCP - General (Family Medicine) Nicholas Lose, MD as Consulting Physician (Hematology and Oncology) [name], MD as Consulting Physician (General Surgery) Gery Pray, MD as Consulting Physician (Radiation Oncology)  DIAGNOSIS:  Encounter Diagnosis  Name Primary?  . Malignant neoplasm of upper-outer quadrant of right breast in female, estrogen receptor positive ([place])     SUMMARY OF ONCOLOGIC HISTORY:   Malignant neoplasm of upper-outer quadrant of right breast in female, estrogen receptor positive ([place])   12/05/2016 Initial Diagnosis    Screening detected calcifications Rt breast spanning 1.5 cm, U/S measured a lesion 1.2 cm size, Biopsy revealed IDC Grade 3, ER/PR Negative, Her 2 Neg Ratio: 1.05; Ki 67: 20%; T1bN0 Stage 1A Clinical stage      12/24/2016 Genetic Testing    The patient had genetic testing due to a personal and family history of breast cancer.  The [facility] Common Hereditary Cancer Panel + Melanoma panel was sent out. The Hereditary Gene Panel + Melanoma Panel offered by [facility] includes sequencing and/or deletion duplication testing of the following 53 genes: APC, ATM, AXIN2, BAP1, BARD1, BMPR1A, BRCA1, BRCA2, BRIP1, CDH1, CDK4, CDKN2A (p14ARF), CDKN2A (p16INK4a), CHEK2, CTNNA1, DICER1, EPCAM (Deletion/duplication testing only), GREM1 (promoter region deletion/duplication testing only), KIT, MC1R, MEN1, MLH1, MSH2, MSH3, MSH6, MUTYH, NBN, NF1, NHTL1, PALB2, PDGFRA, PMS2, POLD1, POLE, POT1, PTEN, RAD50, RAD51C, RAD51D, RB1, SDHB, SDHC, SDHD, SMAD4, SMARCA4. STK11, TERT, TP53, TSC1, TSC2, and VHL.  The following genes were evaluated for sequence changes only: MITF, SDHA and HOXB13 c.251G>A variant only.  Results: No pathogenic mutations identified. 3 VU'sS in BRCA1 c.2967T>A (p.Phe989Leu) , MSH6 c.2857G>A (p.Glu953Lys), and TERT c.902G>A (p.Arg301His) were identified.  The date of this test result is 12/24/2016.        12/28/2016  Surgery    Right lumpectomy: IDC grade 2, 1.2 cm, broadly 0.1 cm to the posterior margin, 0/5 lymph nodes negative, ER 0%, PR 0%, HER-2 negative ratio 1.05, Ki-67 5%, T1 cN0 stage IB AJCC 8      01/25/2017 -  Chemotherapy    Adjuvant chemotherapy with dose dense Adriamycin and Cytoxan 4 followed by Taxol weekly 12        CHIEF COMPLIANT: Cycle 1 day 1 dose dense Adriamycin Cytoxan adjuvant chemotherapy  INTERVAL HISTORY: Lorraine Dean is a 51 year old with above-mentioned history of right breast cancer who was treated with lumpectomy and is here today to receive her first cycle of adjuvant chemotherapy with dose dense Adriamycin and Cytoxan. She has healed extremely well from the prior surgery. She reports no new pain or discomfort. She is anxious to get started with treatment.  REVIEW OF SYSTEMS:   Constitutional: Denies fevers, chills or abnormal weight loss Eyes: Denies blurriness of vision Ears, nose, mouth, throat, and face: Denies mucositis or sore throat Respiratory: Denies cough, dyspnea or wheezes Cardiovascular: Denies palpitation, chest discomfort Gastrointestinal:  Denies nausea, heartburn or change in bowel habits Skin: Denies abnormal skin rashes Lymphatics: Denies new lymphadenopathy or easy bruising Neurological:Denies numbness, tingling or new weaknesses Behavioral/Psych: Mood is stable, no new changes  Extremities: No lower extremity edema Breast:  denies any pain or lumps or nodules in either breasts All other systems were reviewed with the patient and are negative.  I have reviewed the past medical history, past surgical history, social history and family history with the patient and they are unchanged from previous note.  ALLERGIES:  has No Known Allergies.  MEDICATIONS:  Current Outpatient Prescriptions  Medication Sig Dispense Refill  . acetaminophen (TYLENOL) 500 MG tablet Take 500 mg by mouth every 6 (six) hours as needed.    Marland Kitchen albuterol (PROVENTIL  HFA;VENTOLIN HFA) 108 (90 Base) MCG/ACT inhaler Inhale 1-2 puffs into the lungs every 4 (four) hours as needed for wheezing or shortness of breath.    Marland Kitchen aspirin EC 81 MG tablet Take 81 mg by mouth daily.    . Biotin 1000 MCG tablet Take 1,000 mcg by mouth 3 (three) times daily.    . calcium-vitamin D (OSCAL WITH D) 500-200 MG-UNIT tablet Take 1 tablet by mouth.    . dexamethasone (DECADRON) 4 MG tablet Take 1 tablet (4 mg total) by mouth daily. Take 1 tablet day after chemotherapy and one more tablet the following day with food 8 tablet 0  . fluticasone (FLONASE) 50 MCG/ACT nasal spray Place 1 spray into both nostrils as needed for allergies or rhinitis.    Marland Kitchen HYDROcodone-acetaminophen (NORCO/VICODIN) 5-325 MG tablet Take 1-2 tablets by mouth every 4 (four) hours as needed for moderate pain or severe pain. 15 tablet 0  . HYDROcodone-acetaminophen (NORCO/VICODIN) 5-325 MG tablet Take 1-2 tablets by mouth every 4 (four) hours as needed for moderate pain or severe pain. 15 tablet 0  . Krill Oil 1000 MG CAPS Take by mouth.    . lidocaine-prilocaine (EMLA) cream Apply to affected area once 30 g 3  . LORazepam (ATIVAN) 0.5 MG tablet Take 1 tablet (0.5 mg total) by mouth at bedtime. 30 tablet 0  . Multiple Vitamins-Minerals (OCUVITE ADULT FORMULA) CAPS Take by mouth.    . ondansetron (ZOFRAN) 8 MG tablet Take 1 tablet (8 mg total) by mouth 2 (two) times daily as needed. Start on the third day after chemotherapy. 30 tablet 1  . prochlorperazine (COMPAZINE) 10 MG tablet Take 1 tablet (10 mg total) by mouth every 6 (six) hours as needed (Nausea or vomiting). 30 tablet 1  . promethazine (PHENERGAN) 12.5 MG tablet Take 1-2 tablets (12.5-25 mg total) by mouth every 6 (six) hours as needed for nausea. 10 tablet 0   No current facility-administered medications for this visit.     PHYSICAL EXAMINATION: ECOG PERFORMANCE STATUS: 0 - Asymptomatic  Vitals:   01/25/17 1039  BP: 114/70  Pulse: 92  Resp: 18    Temp: 98.3 F (36.8 C)  SpO2: 98%   Filed Weights   01/25/17 1039  Weight: 101 lb 12.8 oz (46.2 kg)    GENERAL:alert, no distress and comfortable SKIN: skin color, texture, turgor are normal, no rashes or significant lesions EYES: normal, Conjunctiva are pink and non-injected, sclera clear OROPHARYNX:no exudate, no erythema and lips, buccal mucosa, and tongue normal  NECK: supple, thyroid normal size, non-tender, without nodularity LYMPH:  no palpable lymphadenopathy in the cervical, axillary or inguinal LUNGS: clear to auscultation and percussion with normal breathing effort HEART: regular rate & rhythm and no murmurs and no lower extremity edema ABDOMEN:abdomen soft, non-tender and normal bowel sounds MUSCULOSKELETAL:no cyanosis of digits and no clubbing  NEURO: alert & oriented x 3 with fluent speech, no focal motor/sensory deficits EXTREMITIES: No lower extremity edema  LABORATORY DATA:  I have reviewed the data as listed   Chemistry      Component Value Date/Time   NA 138 01/25/2017 0948   K 3.8 01/25/2017 0948   CO2 24 01/25/2017 0948   BUN 8.3 01/25/2017 0948   CREATININE 0.7 01/25/2017 0948      Component Value Date/Time   CALCIUM 9.6  01/25/2017 0948   ALKPHOS 64 01/25/2017 0948   AST 20 01/25/2017 0948   ALT 20 01/25/2017 0948   BILITOT 0.45 01/25/2017 0948       Lab Results  Component Value Date   WBC 8.2 01/25/2017   HGB 14.3 01/25/2017   HCT 42.1 01/25/2017   MCV 88.8 01/25/2017   PLT 173 01/25/2017   NEUTROABS 6.4 01/25/2017    ASSESSMENT & PLAN:  Malignant neoplasm of upper-outer quadrant of right breast in female, estrogen receptor positive ([place]) 12/28/2016: Right lumpectomy: IDC grade 2, 1.2 cm, broadly 0.1 cm to the posterior margin, 0/5 lymph nodes negative, ER 0%, PR 0%, HER-2 negative ratio 1.05, Ki-67 5%, T1 cN0 stage IB AJCC 8  Pathology counseling: I discussed the final pathology report of the patient provided  a copy of this report.  I discussed the margins as well as lymph node surgeries. We also discussed the final staging along with previously performed ER/PR and HER-2/neu testing.  Recommendation: 1. Adjuvant chemotherapy with dose dense Adriamycin and Cytoxan every 2 weeks 4 followed by weekly Taxol 12 2. followed by adjuvant radiation therapy ---------------------------------------------------------------------- Current treatment: Cycle 1 day 1 dose dense Adriamycin Cytoxan  Antiemetics were reviewed Chemotherapy consent obtained Chemotherapy education completed Echocardiogram 12/27/16: EF 60-65% Closely monitoring for chemotherapy toxicities. Return to clinic in one week for toxicity check  I spent 25 minutes talking to the patient of which more than half was spent in counseling and coordination of care.  No orders of the defined types were placed in this encounter.  The patient has a good understanding of the overall plan. she agrees with it. she will call with any problems that may develop before the next visit here.   Rulon Eisenmenger, MD 01/25/17

## 2017-01-28 ENCOUNTER — Ambulatory Visit: Payer: BLUE CROSS/BLUE SHIELD | Admitting: Physician Assistant

## 2017-01-31 NOTE — Assessment & Plan Note (Signed)
12/28/2016: Right lumpectomy: IDC grade 2, 1.2 cm, broadly 0.1 cm to the posterior margin, 0/5 lymph nodes negative, ER 0%, PR 0%, HER-2 negative ratio 1.05, Ki-67 5%, T1 cN0 stage IB AJCC 8  Pathology counseling: I discussed the final pathology report of the patient provided a copy of this report. I discussed the margins as well as lymph node surgeries. We also discussed the final staging along with previously performed ER/PR and HER-2/neu testing.  Recommendation: 1. Adjuvant chemotherapy with dose dense Adriamycin and Cytoxan every 2 weeks 4 followed by weekly Taxol 12 2. followed by adjuvant radiation therapy ---------------------------------------------------------------------- Current treatment: Cycle 1 day 8 dose dense Adriamycin Cytoxan  Chemotherapy toxicities:  Return to clinic in one week for cycle 2

## 2017-02-01 ENCOUNTER — Telehealth: Payer: Self-pay | Admitting: Hematology and Oncology

## 2017-02-01 ENCOUNTER — Other Ambulatory Visit: Payer: Self-pay

## 2017-02-01 ENCOUNTER — Ambulatory Visit (HOSPITAL_BASED_OUTPATIENT_CLINIC_OR_DEPARTMENT_OTHER): Payer: BLUE CROSS/BLUE SHIELD | Admitting: Hematology and Oncology

## 2017-02-01 ENCOUNTER — Ambulatory Visit (HOSPITAL_BASED_OUTPATIENT_CLINIC_OR_DEPARTMENT_OTHER): Payer: BLUE CROSS/BLUE SHIELD

## 2017-02-01 ENCOUNTER — Other Ambulatory Visit (HOSPITAL_BASED_OUTPATIENT_CLINIC_OR_DEPARTMENT_OTHER): Payer: BLUE CROSS/BLUE SHIELD

## 2017-02-01 DIAGNOSIS — Z95828 Presence of other vascular implants and grafts: Secondary | ICD-10-CM

## 2017-02-01 DIAGNOSIS — Z170 Estrogen receptor positive status [ER+]: Principal | ICD-10-CM

## 2017-02-01 DIAGNOSIS — D701 Agranulocytosis secondary to cancer chemotherapy: Secondary | ICD-10-CM

## 2017-02-01 DIAGNOSIS — C50411 Malignant neoplasm of upper-outer quadrant of right female breast: Secondary | ICD-10-CM

## 2017-02-01 DIAGNOSIS — R530 Neoplastic (malignant) related fatigue: Secondary | ICD-10-CM

## 2017-02-01 DIAGNOSIS — Z23 Encounter for immunization: Secondary | ICD-10-CM

## 2017-02-01 DIAGNOSIS — R110 Nausea: Secondary | ICD-10-CM | POA: Diagnosis not present

## 2017-02-01 LAB — CBC WITH DIFFERENTIAL/PLATELET
BASO%: 0.4 % (ref 0.0–2.0)
Basophils Absolute: 0 10*3/uL (ref 0.0–0.1)
EOS ABS: 0.1 10*3/uL (ref 0.0–0.5)
EOS%: 5 % (ref 0.0–7.0)
HCT: 40.5 % (ref 34.8–46.6)
HGB: 13.6 g/dL (ref 11.6–15.9)
LYMPH%: 35.7 % (ref 14.0–49.7)
MCH: 29.8 pg (ref 25.1–34.0)
MCHC: 33.5 g/dL (ref 31.5–36.0)
MCV: 89.1 fL (ref 79.5–101.0)
MONO#: 0.1 10*3/uL (ref 0.1–0.9)
MONO%: 8.4 % (ref 0.0–14.0)
NEUT%: 50.5 % (ref 38.4–76.8)
NEUTROS ABS: 0.7 10*3/uL — AB (ref 1.5–6.5)
PLATELETS: 119 10*3/uL — AB (ref 145–400)
RBC: 4.54 10*6/uL (ref 3.70–5.45)
RDW: 13.4 % (ref 11.2–14.5)
WBC: 1.4 10*3/uL — AB (ref 3.9–10.3)
lymph#: 0.5 10*3/uL — ABNORMAL LOW (ref 0.9–3.3)

## 2017-02-01 LAB — COMPREHENSIVE METABOLIC PANEL
ALT: 16 U/L (ref 0–55)
ANION GAP: 9 meq/L (ref 3–11)
AST: 15 U/L (ref 5–34)
Albumin: 4 g/dL (ref 3.5–5.0)
Alkaline Phosphatase: 85 U/L (ref 40–150)
BILIRUBIN TOTAL: 0.46 mg/dL (ref 0.20–1.20)
BUN: 12 mg/dL (ref 7.0–26.0)
CHLORIDE: 104 meq/L (ref 98–109)
CO2: 24 meq/L (ref 22–29)
Calcium: 9.6 mg/dL (ref 8.4–10.4)
Creatinine: 0.7 mg/dL (ref 0.6–1.1)
GLUCOSE: 101 mg/dL (ref 70–140)
POTASSIUM: 3.8 meq/L (ref 3.5–5.1)
SODIUM: 138 meq/L (ref 136–145)
Total Protein: 7.3 g/dL (ref 6.4–8.3)

## 2017-02-01 MED ORDER — INFLUENZA VAC SPLIT QUAD 0.5 ML IM SUSY
0.5000 mL | PREFILLED_SYRINGE | Freq: Once | INTRAMUSCULAR | Status: AC
  Administered 2017-02-01: 0.5 mL via INTRAMUSCULAR
  Filled 2017-02-01: qty 0.5

## 2017-02-01 MED ORDER — SODIUM CHLORIDE 0.9% FLUSH
10.0000 mL | INTRAVENOUS | Status: DC | PRN
  Administered 2017-02-01: 10 mL via INTRAVENOUS
  Filled 2017-02-01: qty 10

## 2017-02-01 MED ORDER — HEPARIN SOD (PORK) LOCK FLUSH 100 UNIT/ML IV SOLN
500.0000 [IU] | Freq: Once | INTRAVENOUS | Status: AC | PRN
  Administered 2017-02-01: 500 [IU] via INTRAVENOUS
  Filled 2017-02-01: qty 5

## 2017-02-01 NOTE — Progress Notes (Signed)
Patient Care Team: Lorraine Pepper, MD as PCP - General (Family Medicine) Nicholas Lose, MD as Consulting Physician (Hematology and Oncology) [name], MD as Consulting Physician (General Surgery) Gery Pray, MD as Consulting Physician (Radiation Oncology)  DIAGNOSIS:  Encounter Diagnosis  Name Primary?  . Malignant neoplasm of upper-outer quadrant of right breast in female, estrogen receptor positive ([place])     SUMMARY OF ONCOLOGIC HISTORY:   Malignant neoplasm of upper-outer quadrant of right breast in female, estrogen receptor positive ([place])   12/05/2016 Initial Diagnosis    Screening detected calcifications Rt breast spanning 1.5 cm, U/S measured a lesion 1.2 cm size, Biopsy revealed IDC Grade 3, ER/PR Negative, Her 2 Neg Ratio: 1.05; Ki 67: 20%; T1bN0 Stage 1A Clinical stage      12/24/2016 Genetic Testing    The patient had genetic testing due to a personal and family history of breast cancer.  The [facility] Common Hereditary Cancer Panel + Melanoma panel was sent out. The Hereditary Gene Panel + Melanoma Panel offered by [facility] includes sequencing and/or deletion duplication testing of the following 53 genes: APC, ATM, AXIN2, BAP1, BARD1, BMPR1A, BRCA1, BRCA2, BRIP1, CDH1, CDK4, CDKN2A (p14ARF), CDKN2A (p16INK4a), CHEK2, CTNNA1, DICER1, EPCAM (Deletion/duplication testing only), GREM1 (promoter region deletion/duplication testing only), KIT, MC1R, MEN1, MLH1, MSH2, MSH3, MSH6, MUTYH, NBN, NF1, NHTL1, PALB2, PDGFRA, PMS2, POLD1, POLE, POT1, PTEN, RAD50, RAD51C, RAD51D, RB1, SDHB, SDHC, SDHD, SMAD4, SMARCA4. STK11, TERT, TP53, TSC1, TSC2, and VHL.  The following genes were evaluated for sequence changes only: MITF, SDHA and HOXB13 c.251G>A variant only.  Results: No pathogenic mutations identified. 3 VU'sS in BRCA1 c.2967T>A (p.Phe989Leu) , MSH6 c.2857G>A (p.Glu953Lys), and TERT c.902G>A (p.Arg301His) were identified.  The date of this test result is 12/24/2016.        12/28/2016  Surgery    Right lumpectomy: IDC grade 2, 1.2 cm, broadly 0.1 cm to the posterior margin, 0/5 lymph nodes negative, ER 0%, PR 0%, HER-2 negative ratio 1.05, Ki-67 5%, T1 cN0 stage IB AJCC 8      01/25/2017 -  Chemotherapy    Adjuvant chemotherapy with dose dense Adriamycin and Cytoxan 4 followed by Taxol weekly 12        CHIEF COMPLIANT: Cycle 1 day 8 dose dense Adriamycin and Cytoxan  INTERVAL HISTORY: [name] is a 51 year old with above-mentioned history of right breast cancer treated with lumpectomy and is here today on cycle 1 day 8 of dose dense Adriamycin Cytoxan. Overall she tolerated first cycle of chemotherapy fairly well. She did have fatigue related to chemotherapy. Did not have any nausea vomiting.  REVIEW OF SYSTEMS:   Constitutional: Denies fevers, chills or abnormal weight loss Eyes: Denies blurriness of vision Ears, nose, mouth, throat, and face: Denies mucositis or sore throat Respiratory: Denies cough, dyspnea or wheezes Cardiovascular: Denies palpitation, chest discomfort Gastrointestinal:  Denies nausea, heartburn or change in bowel habits Skin: Denies abnormal skin rashes Lymphatics: Denies new lymphadenopathy or easy bruising Neurological:Denies numbness, tingling or new weaknesses Behavioral/Psych: Mood is stable, no new changes  Extremities: No lower extremity edema  All other systems were reviewed with the patient and are negative.  I have reviewed the past medical history, past surgical history, social history and family history with the patient and they are unchanged from previous note.  ALLERGIES:  has No Known Allergies.  MEDICATIONS:  Current Outpatient Prescriptions  Medication Sig Dispense Refill  . acetaminophen (TYLENOL) 500 MG tablet Take 500 mg by mouth every 6 (six) hours  as needed.    Marland Kitchen albuterol (PROVENTIL HFA;VENTOLIN HFA) 108 (90 Base) MCG/ACT inhaler Inhale 1-2 puffs into the lungs every 4 (four) hours as needed for wheezing or  shortness of breath.    Marland Kitchen aspirin EC 81 MG tablet Take 81 mg by mouth daily.    . Biotin 1000 MCG tablet Take 1,000 mcg by mouth 3 (three) times daily.    . calcium-vitamin D (OSCAL WITH D) 500-200 MG-UNIT tablet Take 1 tablet by mouth.    . dexamethasone (DECADRON) 4 MG tablet Take 1 tablet (4 mg total) by mouth daily. Take 1 tablet day after chemotherapy and one more tablet the following day with food 8 tablet 0  . fluticasone (FLONASE) 50 MCG/ACT nasal spray Place 1 spray into both nostrils as needed for allergies or rhinitis.    Marland Kitchen HYDROcodone-acetaminophen (NORCO/VICODIN) 5-325 MG tablet Take 1-2 tablets by mouth every 4 (four) hours as needed for moderate pain or severe pain. 15 tablet 0  . HYDROcodone-acetaminophen (NORCO/VICODIN) 5-325 MG tablet Take 1-2 tablets by mouth every 4 (four) hours as needed for moderate pain or severe pain. 15 tablet 0  . Krill Oil 1000 MG CAPS Take by mouth.    . lidocaine-prilocaine (EMLA) cream Apply to affected area once 30 g 3  . LORazepam (ATIVAN) 0.5 MG tablet Take 1 tablet (0.5 mg total) by mouth at bedtime. 30 tablet 0  . Multiple Vitamins-Minerals (OCUVITE ADULT FORMULA) CAPS Take by mouth.    . ondansetron (ZOFRAN) 8 MG tablet Take 1 tablet (8 mg total) by mouth 2 (two) times daily as needed. Start on the third day after chemotherapy. 30 tablet 1  . prochlorperazine (COMPAZINE) 10 MG tablet Take 1 tablet (10 mg total) by mouth every 6 (six) hours as needed (Nausea or vomiting). 30 tablet 1  . promethazine (PHENERGAN) 12.5 MG tablet Take 1-2 tablets (12.5-25 mg total) by mouth every 6 (six) hours as needed for nausea. 10 tablet 0   No current facility-administered medications for this visit.     PHYSICAL EXAMINATION: ECOG PERFORMANCE STATUS: 1 - Symptomatic but completely ambulatory  Vitals:   02/01/17 0926  BP: 120/61  Pulse: 84  Resp: 18  Temp: 98.2 F (36.8 C)  SpO2: 100%   Filed Weights   02/01/17 0926  Weight: 100 lb 6.4 oz (45.5 kg)      GENERAL:alert, no distress and comfortable SKIN: skin color, texture, turgor are normal, no rashes or significant lesions EYES: normal, Conjunctiva are pink and non-injected, sclera clear OROPHARYNX:no exudate, no erythema and lips, buccal mucosa, and tongue normal  NECK: supple, thyroid normal size, non-tender, without nodularity LYMPH:  no palpable lymphadenopathy in the cervical, axillary or inguinal LUNGS: clear to auscultation and percussion with normal breathing effort HEART: regular rate & rhythm and no murmurs and no lower extremity edema ABDOMEN:abdomen soft, non-tender and normal bowel sounds MUSCULOSKELETAL:no cyanosis of digits and no clubbing  NEURO: alert & oriented x 3 with fluent speech, no focal motor/sensory deficits EXTREMITIES: No lower extremity edema  LABORATORY DATA:  I have reviewed the data as listed   Chemistry      Component Value Date/Time   NA 138 02/01/2017 0856   K 3.8 02/01/2017 0856   CO2 24 02/01/2017 0856   BUN 12.0 02/01/2017 0856   CREATININE 0.7 02/01/2017 0856      Component Value Date/Time   CALCIUM 9.6 02/01/2017 0856   ALKPHOS 85 02/01/2017 0856   AST 15 02/01/2017 0856   ALT  16 02/01/2017 0856   BILITOT 0.46 02/01/2017 0856       Lab Results  Component Value Date   WBC 1.4 (L) 02/01/2017   HGB 13.6 02/01/2017   HCT 40.5 02/01/2017   MCV 89.1 02/01/2017   PLT 119 (L) 02/01/2017   NEUTROABS 0.7 (L) 02/01/2017    ASSESSMENT & PLAN:  Malignant neoplasm of upper-outer quadrant of right breast in female, estrogen receptor positive ([place]) 12/28/2016: Right lumpectomy: IDC grade 2, 1.2 cm, broadly 0.1 cm to the posterior margin, 0/5 lymph nodes negative, ER 0%, PR 0%, HER-2 negative ratio 1.05, Ki-67 5%, T1 cN0 stage IB AJCC 8  Pathology counseling: I discussed the final pathology report of the patient provided a copy of this report. I discussed the margins as well as lymph node surgeries. We also discussed the final staging  along with previously performed ER/PR and HER-2/neu testing.  Recommendation: 1. Adjuvant chemotherapy with dose dense Adriamycin and Cytoxan every 2 weeks 4 followed by weekly Taxol 12 2. followed by adjuvant radiation therapy ---------------------------------------------------------------------- Current treatment: Cycle 1 day 8 dose dense Adriamycin Cytoxan  Chemotherapy toxicities: 1. Nausea grade 1 2. episode of severe anxiety: Patient called EMS but was not taken to the emergency room. She calmed down after taking lorazepam. 3. Severe fatigue 4. Neutropenia as expected for nadir count  Return to clinic in one week for cycle 2  I spent 25 minutes talking to the patient of which more than half was spent in counseling and coordination of care.  No orders of the defined types were placed in this encounter.  The patient has a good understanding of the overall plan. she agrees with it. she will call with any problems that may develop before the next visit here.   Rulon Eisenmenger, MD 02/01/17

## 2017-02-01 NOTE — Telephone Encounter (Signed)
All appts were previously schedueld. Patient did not want avs or calendar.

## 2017-02-04 ENCOUNTER — Encounter: Payer: Self-pay | Admitting: Physician Assistant

## 2017-02-04 ENCOUNTER — Ambulatory Visit (INDEPENDENT_AMBULATORY_CARE_PROVIDER_SITE_OTHER): Payer: BLUE CROSS/BLUE SHIELD | Admitting: Physician Assistant

## 2017-02-04 VITALS — BP 98/72 | HR 86 | Ht 60.0 in | Wt 102.0 lb

## 2017-02-04 DIAGNOSIS — E785 Hyperlipidemia, unspecified: Secondary | ICD-10-CM

## 2017-02-04 DIAGNOSIS — C50411 Malignant neoplasm of upper-outer quadrant of right female breast: Secondary | ICD-10-CM

## 2017-02-04 DIAGNOSIS — R002 Palpitations: Secondary | ICD-10-CM | POA: Diagnosis not present

## 2017-02-04 DIAGNOSIS — Z8249 Family history of ischemic heart disease and other diseases of the circulatory system: Secondary | ICD-10-CM

## 2017-02-04 DIAGNOSIS — Z170 Estrogen receptor positive status [ER+]: Secondary | ICD-10-CM

## 2017-02-04 NOTE — Patient Instructions (Addendum)
Medication Instructions: Your physician recommends that you continue on your current medications as directed. Please refer to the Current Medication list given to you today.  Labwork: None Ordered  Procedures/Testing: Your physician has requested that you have an echocardiogram after patients finishes chemo therapy treatments. Echocardiography is a painless test that uses sound waves to create images of your heart. It provides your doctor with information about the size and shape of your heart and how well your heart's chambers and valves are working. This procedure takes approximately one hour. There are no restrictions for this procedure.  Follow-Up: Your physician wants you to follow-up in: 5-6 MONTHS with Dr. Harrington Challenger. You will receive a reminder letter in the mail two months in advance. If you don't receive a letter, please call our office to schedule the follow-up appointment.   If you need a refill on your cardiac medications before your next appointment, please call your pharmacy.

## 2017-02-04 NOTE — Progress Notes (Signed)
Cardiology Office Note    Date:  02/04/2017   ID:  Lorraine Dean, DOB June 20, 1965, MRN [id number]  PCP:  [name], MD  Cardiologist: Dr. Harrington Challenger  Chief Complaint  Patient presents with  . Follow-up    History of Present Illness:  Lorraine Dean is a 51 y.o. female who I saw 12/24/16 for preoperative evaluation before undergoing surgery for breast cancer. Patient had one year history of heart racing when she lays down at night. No exertional symptoms and can walk 30 minutes without any symptoms. Discussed with Dr. Harrington Challenger who felt the patient was low risk for cardiac complications during surgery. We also ordered a 48 hour monitor and 2-D echo. If she develops exertional symptoms can do a stress test in the future. She has a family history of CAD with her father having CABG in his 63s.  48 hour monitor showed sinus rhythm every chart rate 74 bpm. Some bradycardia without pathologic positives, PVCs and PACs, no atrial fibrillation or sustained arrhythmia. 2-D echo normal LVEF 60-65% with grade 1 DD.  Patient comes in today feeling well. No significant palpitations. Underwent lumpectomy for breast CA and had her first chemo treatment. Says she needs another echo after chemo.  Past Medical History:  Diagnosis Date  . Anxiety   . Cancer [facility])    right breast cancer  . Chronic rhinitis   . Depression   . Family history of breast cancer 12/13/2016  . Family history of lung cancer 12/13/2016  . High cholesterol   . HPV in female   . PONV (postoperative nausea and vomiting)     Past Surgical History:  Procedure Laterality Date  . BREAST BIOPSY Right 2018   triple negative IDC grade 3 tumor  . BREAST LUMPECTOMY WITH RADIOACTIVE SEED AND SENTINEL LYMPH NODE BIOPSY Right 12/28/2016   Procedure: RIGHT BREAST LUMPECTOMY WITH RADIOACTIVE SEED AND SENTINEL LYMPH NODE MAPPING ERAS PATHWAY;  Surgeon: [name], MD;  Location: [place];  Service: General;  Laterality: Right;   PEC BLOCK  . PORTACATH PLACEMENT Left 01/18/2017   Procedure: INSERTION PORT-A-CATH;  Surgeon: [name], MD;  Location: [place];  Service: General;  Laterality: Left;  . WISDOM TOOTH EXTRACTION      Current Medications: Current Meds  Medication Sig  . acetaminophen (TYLENOL) 500 MG tablet Take 500 mg by mouth every 6 (six) hours as needed.  Marland Kitchen albuterol (PROVENTIL HFA;VENTOLIN HFA) 108 (90 Base) MCG/ACT inhaler Inhale 1-2 puffs into the lungs every 4 (four) hours as needed for wheezing or shortness of breath.  Marland Kitchen aspirin EC 81 MG tablet Take 81 mg by mouth daily.  . Biotin 1000 MCG tablet Take 1,000 mcg by mouth 3 (three) times daily.  . calcium-vitamin D (OSCAL WITH D) 500-200 MG-UNIT tablet Take 1 tablet by mouth.  . dexamethasone (DECADRON) 4 MG tablet Take 1 tablet (4 mg total) by mouth daily. Take 1 tablet day after chemotherapy and one more tablet the following day with food  . fluticasone (FLONASE) 50 MCG/ACT nasal spray Place 1 spray into both nostrils as needed for allergies or rhinitis.  Marland Kitchen HYDROcodone-acetaminophen (NORCO/VICODIN) 5-325 MG tablet Take 1-2 tablets by mouth every 4 (four) hours as needed for moderate pain or severe pain.  [name] Oil 1000 MG CAPS Take by mouth.  . lidocaine-prilocaine (EMLA) cream Apply to affected area once  . LORazepam (ATIVAN) 0.5 MG tablet Take 1 tablet (0.5 mg total) by mouth at  bedtime.  . Multiple Vitamins-Minerals (OCUVITE ADULT FORMULA) CAPS Take by mouth.  . ondansetron (ZOFRAN) 8 MG tablet Take 1 tablet (8 mg total) by mouth 2 (two) times daily as needed. Start on the third day after chemotherapy.  . prochlorperazine (COMPAZINE) 10 MG tablet Take 1 tablet (10 mg total) by mouth every 6 (six) hours as needed (Nausea or vomiting).     Allergies:   Patient has no known allergies.   Social History   Social History  . Marital status: Single    Spouse name: N/A  . Number of children: N/A  . Years of education: N/A    Social History Main Topics  . Smoking status: Never Smoker  . Smokeless tobacco: Never Used  . Alcohol use Yes     Comment: social  . Drug use: No  . Sexual activity: Yes    Birth control/ protection: None     Comment: Intercourse 1 month ago was on the pill,then stoppedl   Other Topics Concern  . None   Social History Narrative  . None     Family History:  The patient's family history includes Breast cancer (age of onset: 93) in her maternal aunt; Heart disease in her paternal grandfather and paternal grandmother; Heart disease (age of onset: 72) in her father; Lung cancer in her maternal aunt and paternal aunt.   ROS:   Please see the history of present illness.    Review of Systems  Constitution: Negative.  HENT: Negative.   Eyes: Negative.   Cardiovascular: Negative.   Respiratory: Negative.   Hematologic/Lymphatic: Negative.   Musculoskeletal: Negative.  Negative for joint pain.  Gastrointestinal: Negative.   Genitourinary: Negative.   Neurological: Negative.    All other systems reviewed and are negative.   PHYSICAL EXAM:   VS:  BP 98/72 (BP Location: Left Arm, Patient Position: Sitting, Cuff Size: Normal)   Pulse 86   Ht 5' (1.524 m)   Wt 102 lb (46.3 kg)   SpO2 99%   BMI 19.92 kg/m   Physical Exam  GEN: Well nourished, well developed, in no acute distress  Neck: no JVD, carotid bruits, or masses Cardiac:RRR; no murmurs, rubs, or gallops  Respiratory:  clear to auscultation bilaterally, normal work of breathing GI: soft, nontender, nondistended, + BS Ext: without cyanosis, clubbing, or edema, Good distal pulses bilaterally Neuro:  Alert and Oriented x 3,  Psych: euthymic mood, full affect  Wt Readings from Last 3 Encounters:  02/04/17 102 lb (46.3 kg)  02/01/17 100 lb 6.4 oz (45.5 kg)  01/25/17 101 lb 12.8 oz (46.2 kg)      Studies/Labs Reviewed:   EKG:  EKG is not ordered today.    Recent Labs: 02/01/2017: ALT 16; BUN 12.0; Creatinine 0.7;  HGB 13.6; Platelets 119; Potassium 3.8; Sodium 138   Lipid Panel No results found for: CHOL, TRIG, HDL, CHOLHDL, VLDL, LDLCALC, LDLDIRECT  Additional studies/ records that were reviewed today include:  2-D echo 12/27/16  Study Conclusions   - Left ventricle: The cavity size was normal. Wall thickness was   normal. Systolic function was normal. The estimated ejection   fraction was in the range of 60% to 65%. Wall motion was normal;   there were no regional wall motion abnormalities. Doppler   parameters are consistent with abnormal left ventricular   relaxation (grade 1 diastolic dysfunction). The E/e&' ratio is <8,   suggesting normal LV filling pressure. - Left atrium: The atrium was normal in size. -  Inferior vena cava: The vessel was normal in size. The   respirophasic diameter changes were in the normal range (>= 50%),   consistent with normal central venous pressure.   Impressions:   - LVEF 60-65%, normal wall thickness, normal wall motion, grade 1   DD, normal LV filling pressure, normal LA size, normal IVC.   Holter monitor 8/20/18Study Highlights  Sinus rhythm with average HR 74 bpm Periods of sinus bradycardia without pathologic pauses few PVC's and PAC's No atrial fibrillation or sustained arrhythmia       ASSESSMENT:    1. Palpitations   2. Hyperlipidemia, unspecified hyperlipidemia type   3. Family history of early CAD   14. Malignant neoplasm of upper-outer quadrant of right breast in female, estrogen receptor positive ([place])      PLAN:  In order of problems listed above:  Palpitations Holter monitor without significant arrhythmia. She did have some sinus bradycardia and a few PVCs and PACs. 2-D echo with normal LV function and grade 1 DD.Patient hasn't had a significant palpitations since decreasing her caffeine.  Hyperlipidemia diet controlled  Family history of early CAD  Breast CA status post lumpectomy and now undergoing chemotherapy. Will need  follow-up echo after she finishes chemotherapy. Follow-up with Dr. Harrington Challenger after echo in about 5 months.      Medication Adjustments/Labs and Tests Ordered: Current medicines are reviewed at length with the patient today.  Concerns regarding medicines are outlined above.  Medication changes, Labs and Tests ordered today are listed in the Patient Instructions below. There are no Patient Instructions on file for this visit.   Signed, Ermalinda Barrios, PA-C  02/04/2017 2:11 PM    [facility] [address], [place], [place]  [address] Phone: [phone]; Fax: [phone]

## 2017-02-08 ENCOUNTER — Ambulatory Visit (HOSPITAL_BASED_OUTPATIENT_CLINIC_OR_DEPARTMENT_OTHER): Payer: BLUE CROSS/BLUE SHIELD

## 2017-02-08 ENCOUNTER — Encounter: Payer: Self-pay | Admitting: General Practice

## 2017-02-08 ENCOUNTER — Ambulatory Visit: Payer: BLUE CROSS/BLUE SHIELD

## 2017-02-08 ENCOUNTER — Other Ambulatory Visit (HOSPITAL_BASED_OUTPATIENT_CLINIC_OR_DEPARTMENT_OTHER): Payer: BLUE CROSS/BLUE SHIELD

## 2017-02-08 ENCOUNTER — Ambulatory Visit (HOSPITAL_BASED_OUTPATIENT_CLINIC_OR_DEPARTMENT_OTHER): Payer: BLUE CROSS/BLUE SHIELD | Admitting: Hematology and Oncology

## 2017-02-08 DIAGNOSIS — C50411 Malignant neoplasm of upper-outer quadrant of right female breast: Secondary | ICD-10-CM

## 2017-02-08 DIAGNOSIS — Z95828 Presence of other vascular implants and grafts: Secondary | ICD-10-CM

## 2017-02-08 DIAGNOSIS — Z5111 Encounter for antineoplastic chemotherapy: Secondary | ICD-10-CM

## 2017-02-08 DIAGNOSIS — Z170 Estrogen receptor positive status [ER+]: Principal | ICD-10-CM

## 2017-02-08 LAB — CBC WITH DIFFERENTIAL/PLATELET
BASO%: 0.7 % (ref 0.0–2.0)
Basophils Absolute: 0.1 10*3/uL (ref 0.0–0.1)
EOS%: 0.3 % (ref 0.0–7.0)
Eosinophils Absolute: 0 10*3/uL (ref 0.0–0.5)
HEMATOCRIT: 39.8 % (ref 34.8–46.6)
HGB: 13.4 g/dL (ref 11.6–15.9)
LYMPH#: 1.4 10*3/uL (ref 0.9–3.3)
LYMPH%: 18.6 % (ref 14.0–49.7)
MCH: 30.2 pg (ref 25.1–34.0)
MCHC: 33.7 g/dL (ref 31.5–36.0)
MCV: 89.6 fL (ref 79.5–101.0)
MONO#: 0.7 10*3/uL (ref 0.1–0.9)
MONO%: 9.4 % (ref 0.0–14.0)
NEUT%: 71 % (ref 38.4–76.8)
NEUTROS ABS: 5.2 10*3/uL (ref 1.5–6.5)
PLATELETS: 178 10*3/uL (ref 145–400)
RBC: 4.44 10*6/uL (ref 3.70–5.45)
RDW: 13.2 % (ref 11.2–14.5)
WBC: 7.4 10*3/uL (ref 3.9–10.3)

## 2017-02-08 LAB — COMPREHENSIVE METABOLIC PANEL
ALT: 29 U/L (ref 0–55)
ANION GAP: 10 meq/L (ref 3–11)
AST: 24 U/L (ref 5–34)
Albumin: 4 g/dL (ref 3.5–5.0)
Alkaline Phosphatase: 76 U/L (ref 40–150)
BUN: 10.7 mg/dL (ref 7.0–26.0)
CALCIUM: 9.6 mg/dL (ref 8.4–10.4)
CO2: 25 meq/L (ref 22–29)
CREATININE: 0.7 mg/dL (ref 0.6–1.1)
Chloride: 107 mEq/L (ref 98–109)
Glucose: 99 mg/dl (ref 70–140)
Potassium: 4 mEq/L (ref 3.5–5.1)
Sodium: 143 mEq/L (ref 136–145)
TOTAL PROTEIN: 7.2 g/dL (ref 6.4–8.3)

## 2017-02-08 MED ORDER — PALONOSETRON HCL INJECTION 0.25 MG/5ML
INTRAVENOUS | Status: AC
  Filled 2017-02-08: qty 5

## 2017-02-08 MED ORDER — CYCLOPHOSPHAMIDE CHEMO INJECTION 1 GM
600.0000 mg/m2 | Freq: Once | INTRAMUSCULAR | Status: AC
  Administered 2017-02-08: 860 mg via INTRAVENOUS
  Filled 2017-02-08: qty 43

## 2017-02-08 MED ORDER — DOXORUBICIN HCL CHEMO IV INJECTION 2 MG/ML
60.0000 mg/m2 | Freq: Once | INTRAVENOUS | Status: AC
  Administered 2017-02-08: 86 mg via INTRAVENOUS
  Filled 2017-02-08: qty 43

## 2017-02-08 MED ORDER — SODIUM CHLORIDE 0.9% FLUSH
10.0000 mL | INTRAVENOUS | Status: DC | PRN
  Administered 2017-02-08: 10 mL via INTRAVENOUS
  Filled 2017-02-08: qty 10

## 2017-02-08 MED ORDER — SODIUM CHLORIDE 0.9% FLUSH
10.0000 mL | INTRAVENOUS | Status: DC | PRN
  Administered 2017-02-08: 10 mL
  Filled 2017-02-08: qty 10

## 2017-02-08 MED ORDER — SODIUM CHLORIDE 0.9 % IV SOLN
Freq: Once | INTRAVENOUS | Status: AC
  Administered 2017-02-08: 12:00:00 via INTRAVENOUS

## 2017-02-08 MED ORDER — HEPARIN SOD (PORK) LOCK FLUSH 100 UNIT/ML IV SOLN
500.0000 [IU] | Freq: Once | INTRAVENOUS | Status: AC | PRN
  Administered 2017-02-08: 500 [IU]
  Filled 2017-02-08: qty 5

## 2017-02-08 MED ORDER — FOSAPREPITANT DIMEGLUMINE INJECTION 150 MG
Freq: Once | INTRAVENOUS | Status: AC
  Administered 2017-02-08: 13:00:00 via INTRAVENOUS
  Filled 2017-02-08: qty 5

## 2017-02-08 MED ORDER — PEGFILGRASTIM 6 MG/0.6ML ~~LOC~~ PSKT
6.0000 mg | PREFILLED_SYRINGE | Freq: Once | SUBCUTANEOUS | Status: AC
  Administered 2017-02-08: 6 mg via SUBCUTANEOUS
  Filled 2017-02-08: qty 0.6

## 2017-02-08 MED ORDER — PALONOSETRON HCL INJECTION 0.25 MG/5ML
0.2500 mg | Freq: Once | INTRAVENOUS | Status: AC
  Administered 2017-02-08: 0.25 mg via INTRAVENOUS

## 2017-02-08 NOTE — Progress Notes (Signed)
[place] Spiritual Care Note  Followed up with Lorraine Dean in infusion, where she was accompanied by her mother.  Per pt, she was feeling good today, even after working all week last week; prior to that time, delayed side effects from chemo were difficult for her.  She reports a little more ease with responding to shakes/anxiety if a similar issue arises again.  She is noticing that her hair is thinning and hopes to get a transitional haircut tonight; per pt, facing hair loss is emotionally hard for her because it is a public sign of being sick (so she won't be keep her dx or life situation as private anymore--now she will look sick).  She plans to try a wig for social settings (but feels less discomfort about showing her hair loss at work, because people there are already aware).  Provided pastoral presence, hugs, encouragement, empathic listening, and opportunity for Lorraine Dean to process her experience so far.  Affirmed that this work of adjusting and processing will yield personal growth, even though it mostly just feels hard right now.  She found this reframing helpful.  We plan to f/u when she is on campus, and Daren knows to contact Team anytime, but please also page if immediate needs arise.  Thank you.   [place], [place], [facility] Pager [phone] Voicemail [phone]

## 2017-02-08 NOTE — Assessment & Plan Note (Addendum)
12/28/2016: Right lumpectomy: IDC grade 2, 1.2 cm, broadly 0.1 cm to the posterior margin, 0/5 lymph nodes negative, ER 0%, PR 0%, HER-2 negative ratio 1.05, Ki-67 5%, T1 cN0 stage IB AJCC 8  Pathology counseling: I discussed the final pathology report of the patient provided a copy of this report. I discussed the margins as well as lymph node surgeries. We also discussed the final staging along with previously performed ER/PR and HER-2/neu testing.  Recommendation: 1. Adjuvant chemotherapy with dose dense Adriamycin and Cytoxan every 2 weeks 4 followed by weekly Taxol 12 2. followed by adjuvant radiation therapy ---------------------------------------------------------------------- Current treatment: Cycle 2 day 1 dose dense Adriamycin Cytoxan  Chemotherapy toxicities: 1. Nausea grade 1 2. episode of severe anxiety: Patient called EMS but was not taken to the emergency room. She calmed down after taking lorazepam. 3. Severe fatigue 4. Neutropenia as expected for nadir count  Return to clinic in one week for cycle 3

## 2017-02-08 NOTE — Progress Notes (Signed)
Patient Care Team: Lorraine Pepper, MD as PCP - General (Family Medicine) Nicholas Lose, MD as Consulting Physician (Hematology and Oncology) [name], MD as Consulting Physician (General Surgery) Gery Pray, MD as Consulting Physician (Radiation Oncology)  DIAGNOSIS:  Encounter Diagnosis  Name Primary?  . Malignant neoplasm of upper-outer quadrant of right breast in female, estrogen receptor positive ([place])     SUMMARY OF ONCOLOGIC HISTORY:   Malignant neoplasm of upper-outer quadrant of right breast in female, estrogen receptor positive ([place])   12/05/2016 Initial Diagnosis    Screening detected calcifications Rt breast spanning 1.5 cm, U/S measured a lesion 1.2 cm size, Biopsy revealed IDC Grade 3, ER/PR Negative, Her 2 Neg Ratio: 1.05; Ki 67: 20%; T1bN0 Stage 1A Clinical stage      12/24/2016 Genetic Testing    The patient had genetic testing due to a personal and family history of breast cancer.  The [facility] Common Hereditary Cancer Panel + Melanoma panel was sent out. The Hereditary Gene Panel + Melanoma Panel offered by [facility] includes sequencing and/or deletion duplication testing of the following 53 genes: APC, ATM, AXIN2, BAP1, BARD1, BMPR1A, BRCA1, BRCA2, BRIP1, CDH1, CDK4, CDKN2A (p14ARF), CDKN2A (p16INK4a), CHEK2, CTNNA1, DICER1, EPCAM (Deletion/duplication testing only), GREM1 (promoter region deletion/duplication testing only), KIT, MC1R, MEN1, MLH1, MSH2, MSH3, MSH6, MUTYH, NBN, NF1, NHTL1, PALB2, PDGFRA, PMS2, POLD1, POLE, POT1, PTEN, RAD50, RAD51C, RAD51D, RB1, SDHB, SDHC, SDHD, SMAD4, SMARCA4. STK11, TERT, TP53, TSC1, TSC2, and VHL.  The following genes were evaluated for sequence changes only: MITF, SDHA and HOXB13 c.251G>A variant only.  Results: No pathogenic mutations identified. 3 VU'sS in BRCA1 c.2967T>A (p.Phe989Leu) , MSH6 c.2857G>A (p.Glu953Lys), and TERT c.902G>A (p.Arg301His) were identified.  The date of this test result is 12/24/2016.        12/28/2016  Surgery    Right lumpectomy: IDC grade 2, 1.2 cm, broadly 0.1 cm to the posterior margin, 0/5 lymph nodes negative, ER 0%, PR 0%, HER-2 negative ratio 1.05, Ki-67 5%, T1 cN0 stage IB AJCC 8      01/25/2017 -  Chemotherapy    Adjuvant chemotherapy with dose dense Adriamycin and Cytoxan 4 followed by Taxol weekly 12        CHIEF COMPLIANT: Cycle 2 adjuvant chemotherapy with dose dense Adriamycin and Cytoxan  INTERVAL HISTORY: Lorraine Dean is a 51 year old with above-mentioned history of right breast cancer treated with lumpectomy and is currently receiving second cycle of dose dense 0 metastases and Cytoxan. After the first cycle she had a panic attack which resolved with Ativan. Rest of the week was fine. She did not have any problems. She denies any nausea vomiting at this time.  REVIEW OF SYSTEMS:   Constitutional: Denies fevers, chills or abnormal weight loss Eyes: Denies blurriness of vision Ears, nose, mouth, throat, and face: Denies mucositis or sore throat Respiratory: Denies cough, dyspnea or wheezes Cardiovascular: Denies palpitation, chest discomfort Gastrointestinal:  Denies nausea, heartburn or change in bowel habits Skin: Denies abnormal skin rashes Lymphatics: Denies new lymphadenopathy or easy bruising Neurological:Denies numbness, tingling or new weaknesses Behavioral/Psych: Mood is stable, no new changes  Extremities: No lower extremity edema Breast: Right lumpectomy All other systems were reviewed with the patient and are negative.  I have reviewed the past medical history, past surgical history, social history and family history with the patient and they are unchanged from previous note.  ALLERGIES:  has No Known Allergies.  MEDICATIONS:  Current Outpatient Prescriptions  Medication Sig Dispense Refill  . acetaminophen (  TYLENOL) 500 MG tablet Take 500 mg by mouth every 6 (six) hours as needed.    Marland Kitchen albuterol (PROVENTIL HFA;VENTOLIN HFA) 108 (90 Base)  MCG/ACT inhaler Inhale 1-2 puffs into the lungs every 4 (four) hours as needed for wheezing or shortness of breath.    Marland Kitchen aspirin EC 81 MG tablet Take 81 mg by mouth daily.    . Biotin 1000 MCG tablet Take 1,000 mcg by mouth 3 (three) times daily.    . calcium-vitamin D (OSCAL WITH D) 500-200 MG-UNIT tablet Take 1 tablet by mouth.    . dexamethasone (DECADRON) 4 MG tablet Take 1 tablet (4 mg total) by mouth daily. Take 1 tablet day after chemotherapy and one more tablet the following day with food 8 tablet 0  . fluticasone (FLONASE) 50 MCG/ACT nasal spray Place 1 spray into both nostrils as needed for allergies or rhinitis.    Marland Kitchen HYDROcodone-acetaminophen (NORCO/VICODIN) 5-325 MG tablet Take 1-2 tablets by mouth every 4 (four) hours as needed for moderate pain or severe pain. 15 tablet 0  . Krill Oil 1000 MG CAPS Take by mouth.    . lidocaine-prilocaine (EMLA) cream Apply to affected area once 30 g 3  . LORazepam (ATIVAN) 0.5 MG tablet Take 1 tablet (0.5 mg total) by mouth at bedtime. 30 tablet 0  . Multiple Vitamins-Minerals (OCUVITE ADULT FORMULA) CAPS Take by mouth.    . ondansetron (ZOFRAN) 8 MG tablet Take 1 tablet (8 mg total) by mouth 2 (two) times daily as needed. Start on the third day after chemotherapy. 30 tablet 1  . prochlorperazine (COMPAZINE) 10 MG tablet Take 1 tablet (10 mg total) by mouth every 6 (six) hours as needed (Nausea or vomiting). 30 tablet 1   No current facility-administered medications for this visit.     PHYSICAL EXAMINATION: ECOG PERFORMANCE STATUS: 1 - Symptomatic but completely ambulatory  Vitals:   02/08/17 1026  BP: (!) 116/55  Pulse: 88  Resp: 19  Temp: 98.2 F (36.8 C)  SpO2: 100%   Filed Weights   02/08/17 1026  Weight: 100 lb 14.4 oz (45.8 kg)    GENERAL:alert, no distress and comfortable SKIN: skin color, texture, turgor are normal, no rashes or significant lesions EYES: normal, Conjunctiva are pink and non-injected, sclera  clear OROPHARYNX:no exudate, no erythema and lips, buccal mucosa, and tongue normal  NECK: supple, thyroid normal size, non-tender, without nodularity LYMPH:  no palpable lymphadenopathy in the cervical, axillary or inguinal LUNGS: clear to auscultation and percussion with normal breathing effort HEART: regular rate & rhythm and no murmurs and no lower extremity edema ABDOMEN:abdomen soft, non-tender and normal bowel sounds MUSCULOSKELETAL:no cyanosis of digits and no clubbing  NEURO: alert & oriented x 3 with fluent speech, no focal motor/sensory deficits EXTREMITIES: No lower extremity edema  LABORATORY DATA:  I have reviewed the data as listed   Chemistry      Component Value Date/Time   NA 138 02/01/2017 0856   K 3.8 02/01/2017 0856   CO2 24 02/01/2017 0856   BUN 12.0 02/01/2017 0856   CREATININE 0.7 02/01/2017 0856      Component Value Date/Time   CALCIUM 9.6 02/01/2017 0856   ALKPHOS 85 02/01/2017 0856   AST 15 02/01/2017 0856   ALT 16 02/01/2017 0856   BILITOT 0.46 02/01/2017 0856       Lab Results  Component Value Date   WBC 7.4 02/08/2017   HGB 13.4 02/08/2017   HCT 39.8 02/08/2017   MCV 89.6  02/08/2017   PLT 178 02/08/2017   NEUTROABS 5.2 02/08/2017    ASSESSMENT & PLAN:  Malignant neoplasm of upper-outer quadrant of right breast in female, estrogen receptor positive ([place]) 12/28/2016: Right lumpectomy: IDC grade 2, 1.2 cm, broadly 0.1 cm to the posterior margin, 0/5 lymph nodes negative, ER 0%, PR 0%, HER-2 negative ratio 1.05, Ki-67 5%, T1 cN0 stage IB AJCC 8  Pathology counseling: I discussed the final pathology report of the patient provided a copy of this report. I discussed the margins as well as lymph node surgeries. We also discussed the final staging along with previously performed ER/PR and HER-2/neu testing.  Recommendation: 1. Adjuvant chemotherapy with dose dense Adriamycin and Cytoxan every 2 weeks 4 followed by weekly Taxol 12 2. followed  by adjuvant radiation therapy ---------------------------------------------------------------------- Current treatment: Cycle 2 day 1 dose dense Adriamycin Cytoxan  Chemotherapy toxicities: 1. Nausea grade 1 2. episode of severe anxiety: Patient called EMS but was not taken to the emergency room. She calmed down after taking lorazepam. 3. Severe fatigue 4. Neutropenia as expected for nadir count  Return to clinic in 2 weeks for cycle 3   I spent 25 minutes talking to the patient of which more than half was spent in counseling and coordination of care.  No orders of the defined types were placed in this encounter.  The patient has a good understanding of the overall plan. she agrees with it. she will call with any problems that may develop before the next visit here.   Rulon Eisenmenger, MD 02/08/17

## 2017-02-08 NOTE — Patient Instructions (Signed)
[facility] Discharge Instructions for Patients Receiving Chemotherapy  Today you received the following chemotherapy agents Adriamycin, Cytoxan.  To help prevent nausea and vomiting after your treatment, we encourage you to take your nausea medication as prescribed.   If you develop nausea and vomiting that is not controlled by your nausea medication, call the clinic.   BELOW ARE SYMPTOMS THAT SHOULD BE REPORTED IMMEDIATELY:  *FEVER GREATER THAN 100.5 F  *CHILLS WITH OR WITHOUT FEVER  NAUSEA AND VOMITING THAT IS NOT CONTROLLED WITH YOUR NAUSEA MEDICATION  *UNUSUAL SHORTNESS OF BREATH  *UNUSUAL BRUISING OR BLEEDING  TENDERNESS IN MOUTH AND THROAT WITH OR WITHOUT PRESENCE OF ULCERS  *URINARY PROBLEMS  *BOWEL PROBLEMS  UNUSUAL RASH Items with * indicate a potential emergency and should be followed up as soon as possible.  Feel free to call the clinic should you have any questions or concerns. The clinic phone number is [phone].  Please show the CHEMO ALERT CARD at check-in to the Emergency Department and triage nurse.   

## 2017-02-15 ENCOUNTER — Ambulatory Visit: Payer: BLUE CROSS/BLUE SHIELD | Admitting: Hematology and Oncology

## 2017-02-15 ENCOUNTER — Ambulatory Visit: Payer: BLUE CROSS/BLUE SHIELD

## 2017-02-15 ENCOUNTER — Other Ambulatory Visit: Payer: BLUE CROSS/BLUE SHIELD

## 2017-02-22 ENCOUNTER — Ambulatory Visit (HOSPITAL_BASED_OUTPATIENT_CLINIC_OR_DEPARTMENT_OTHER): Payer: BLUE CROSS/BLUE SHIELD

## 2017-02-22 ENCOUNTER — Ambulatory Visit: Payer: BLUE CROSS/BLUE SHIELD

## 2017-02-22 ENCOUNTER — Other Ambulatory Visit (HOSPITAL_BASED_OUTPATIENT_CLINIC_OR_DEPARTMENT_OTHER): Payer: BLUE CROSS/BLUE SHIELD

## 2017-02-22 ENCOUNTER — Ambulatory Visit (HOSPITAL_BASED_OUTPATIENT_CLINIC_OR_DEPARTMENT_OTHER): Payer: BLUE CROSS/BLUE SHIELD | Admitting: Hematology and Oncology

## 2017-02-22 ENCOUNTER — Telehealth: Payer: Self-pay | Admitting: Hematology and Oncology

## 2017-02-22 ENCOUNTER — Encounter: Payer: Self-pay | Admitting: General Practice

## 2017-02-22 DIAGNOSIS — Z95828 Presence of other vascular implants and grafts: Secondary | ICD-10-CM

## 2017-02-22 DIAGNOSIS — D701 Agranulocytosis secondary to cancer chemotherapy: Secondary | ICD-10-CM | POA: Diagnosis not present

## 2017-02-22 DIAGNOSIS — Z170 Estrogen receptor positive status [ER+]: Principal | ICD-10-CM

## 2017-02-22 DIAGNOSIS — Z5111 Encounter for antineoplastic chemotherapy: Secondary | ICD-10-CM

## 2017-02-22 DIAGNOSIS — C50411 Malignant neoplasm of upper-outer quadrant of right female breast: Secondary | ICD-10-CM

## 2017-02-22 LAB — CBC WITH DIFFERENTIAL/PLATELET
BASO%: 1.3 % (ref 0.0–2.0)
Basophils Absolute: 0.1 10*3/uL (ref 0.0–0.1)
EOS ABS: 0 10*3/uL (ref 0.0–0.5)
EOS%: 0.3 % (ref 0.0–7.0)
HEMATOCRIT: 38.1 % (ref 34.8–46.6)
HGB: 12.8 g/dL (ref 11.6–15.9)
LYMPH#: 0.9 10*3/uL (ref 0.9–3.3)
LYMPH%: 16.6 % (ref 14.0–49.7)
MCH: 29.8 pg (ref 25.1–34.0)
MCHC: 33.5 g/dL (ref 31.5–36.0)
MCV: 88.7 fL (ref 79.5–101.0)
MONO#: 0.5 10*3/uL (ref 0.1–0.9)
MONO%: 9.6 % (ref 0.0–14.0)
NEUT%: 72.2 % (ref 38.4–76.8)
NEUTROS ABS: 4 10*3/uL (ref 1.5–6.5)
PLATELETS: 200 10*3/uL (ref 145–400)
RBC: 4.29 10*6/uL (ref 3.70–5.45)
RDW: 14.1 % (ref 11.2–14.5)
WBC: 5.5 10*3/uL (ref 3.9–10.3)

## 2017-02-22 LAB — COMPREHENSIVE METABOLIC PANEL
ALK PHOS: 80 U/L (ref 40–150)
ALT: 32 U/L (ref 0–55)
ANION GAP: 8 meq/L (ref 3–11)
AST: 23 U/L (ref 5–34)
Albumin: 3.8 g/dL (ref 3.5–5.0)
BUN: 6.7 mg/dL — ABNORMAL LOW (ref 7.0–26.0)
CALCIUM: 9.1 mg/dL (ref 8.4–10.4)
CO2: 25 mEq/L (ref 22–29)
CREATININE: 0.7 mg/dL (ref 0.6–1.1)
Chloride: 107 mEq/L (ref 98–109)
Glucose: 91 mg/dl (ref 70–140)
Potassium: 3.8 mEq/L (ref 3.5–5.1)
Sodium: 141 mEq/L (ref 136–145)
TOTAL PROTEIN: 6.9 g/dL (ref 6.4–8.3)

## 2017-02-22 MED ORDER — HEPARIN SOD (PORK) LOCK FLUSH 100 UNIT/ML IV SOLN
500.0000 [IU] | Freq: Once | INTRAVENOUS | Status: AC | PRN
  Administered 2017-02-22: 500 [IU]
  Filled 2017-02-22: qty 5

## 2017-02-22 MED ORDER — SODIUM CHLORIDE 0.9 % IV SOLN
Freq: Once | INTRAVENOUS | Status: AC
  Administered 2017-02-22: 13:00:00 via INTRAVENOUS

## 2017-02-22 MED ORDER — DOXORUBICIN HCL CHEMO IV INJECTION 2 MG/ML
60.0000 mg/m2 | Freq: Once | INTRAVENOUS | Status: AC
  Administered 2017-02-22: 86 mg via INTRAVENOUS
  Filled 2017-02-22: qty 43

## 2017-02-22 MED ORDER — SODIUM CHLORIDE 0.9 % IV SOLN
Freq: Once | INTRAVENOUS | Status: AC
  Administered 2017-02-22: 14:00:00 via INTRAVENOUS
  Filled 2017-02-22: qty 5

## 2017-02-22 MED ORDER — SODIUM CHLORIDE 0.9% FLUSH
10.0000 mL | INTRAVENOUS | Status: DC | PRN
  Administered 2017-02-22: 10 mL
  Filled 2017-02-22: qty 10

## 2017-02-22 MED ORDER — CYCLOPHOSPHAMIDE CHEMO INJECTION 1 GM
600.0000 mg/m2 | Freq: Once | INTRAMUSCULAR | Status: AC
  Administered 2017-02-22: 860 mg via INTRAVENOUS
  Filled 2017-02-22: qty 43

## 2017-02-22 MED ORDER — PEGFILGRASTIM 6 MG/0.6ML ~~LOC~~ PSKT
6.0000 mg | PREFILLED_SYRINGE | Freq: Once | SUBCUTANEOUS | Status: AC
  Administered 2017-02-22: 6 mg via SUBCUTANEOUS
  Filled 2017-02-22: qty 0.6

## 2017-02-22 MED ORDER — SODIUM CHLORIDE 0.9% FLUSH
10.0000 mL | INTRAVENOUS | Status: DC | PRN
  Administered 2017-02-22: 10 mL via INTRAVENOUS
  Filled 2017-02-22: qty 10

## 2017-02-22 MED ORDER — PALONOSETRON HCL INJECTION 0.25 MG/5ML
INTRAVENOUS | Status: AC
  Filled 2017-02-22: qty 5

## 2017-02-22 MED ORDER — PALONOSETRON HCL INJECTION 0.25 MG/5ML
0.2500 mg | Freq: Once | INTRAVENOUS | Status: AC
  Administered 2017-02-22: 0.25 mg via INTRAVENOUS

## 2017-02-22 NOTE — Progress Notes (Signed)
Charted in error.

## 2017-02-22 NOTE — Assessment & Plan Note (Addendum)
12/28/2016: Right lumpectomy: IDC grade 2, 1.2 cm, broadly 0.1 cm to the posterior margin, 0/5 lymph nodes negative, ER 0%, PR 0%, HER-2 negative ratio 1.05, Ki-67 5%, T1 cN0 stage IB AJCC 8  Recommendation: 1. Adjuvant chemotherapy with dose dense Adriamycin and Cytoxan every 2 weeks 4 followed by weekly Taxol 12 2. followed by adjuvant radiation therapy ---------------------------------------------------------------------- Current treatment: Cycle 3 day 1 dose dense Adriamycin Cytoxan  Chemotherapy toxicities: 1. Nausea grade 1 2. episode of severe anxiety: resolved 3. Severe fatigue 4. Neutropenia as expected for nadir count  Return to clinic in one week for cycle 4

## 2017-02-22 NOTE — Telephone Encounter (Signed)
Gave patient AVS for 10/19 visit. Patient will pick up schedule after next appointment.

## 2017-02-22 NOTE — Progress Notes (Signed)
[place] Spiritual Care Note  Followed up with Lorraine Dean and Lorraine Dean in infusion.  Lorraine Dean looked more relaxed that I have seen Lorraine before; she cites being halfway done with Lorraine hardest treatment (3/4 done after today, plus a few days to recover) and being familiar with what to expect as causes for decrease in anxiety.  Also affirmed that moving through hair loss, something that she dreaded, is another big step along the way.  Gratitude and perspective are also helping Lorraine cope:  "I'm so glad I have treatment this week instead of last week, when we lost power for three days due to [place]."  Continuing to follow for support and encouragement.   [place], [place], [facility] Pager [phone] Voicemail [phone]

## 2017-02-22 NOTE — Patient Instructions (Signed)
[facility] Discharge Instructions for Patients Receiving Chemotherapy  Today you received the following chemotherapy agents Adriamycin, Cytoxan.  To help prevent nausea and vomiting after your treatment, we encourage you to take your nausea medication as prescribed.   If you develop nausea and vomiting that is not controlled by your nausea medication, call the clinic.   BELOW ARE SYMPTOMS THAT SHOULD BE REPORTED IMMEDIATELY:  *FEVER GREATER THAN 100.5 F  *CHILLS WITH OR WITHOUT FEVER  NAUSEA AND VOMITING THAT IS NOT CONTROLLED WITH YOUR NAUSEA MEDICATION  *UNUSUAL SHORTNESS OF BREATH  *UNUSUAL BRUISING OR BLEEDING  TENDERNESS IN MOUTH AND THROAT WITH OR WITHOUT PRESENCE OF ULCERS  *URINARY PROBLEMS  *BOWEL PROBLEMS  UNUSUAL RASH Items with * indicate a potential emergency and should be followed up as soon as possible.  Feel free to call the clinic should you have any questions or concerns. The clinic phone number is [phone].  Please show the CHEMO ALERT CARD at check-in to the Emergency Department and triage nurse.   

## 2017-02-22 NOTE — Progress Notes (Signed)
 Patient Care Team: Morrow, Aaron, MD as PCP - General (Family Medicine) , , MD as Consulting Physician (Hematology and Oncology) Toth, Paul III, MD as Consulting Physician (General Surgery) Kinard, James, MD as Consulting Physician (Radiation Oncology)  DIAGNOSIS:  Encounter Diagnosis  Name Primary?  . Malignant neoplasm of upper-outer quadrant of right breast in female, estrogen receptor positive (HCC)     SUMMARY OF ONCOLOGIC HISTORY:   Malignant neoplasm of upper-outer quadrant of right breast in female, estrogen receptor positive (HCC)   12/05/2016 Initial Diagnosis    Screening detected calcifications Rt breast spanning 1.5 cm, U/S measured a lesion 1.2 cm size, Biopsy revealed IDC Grade 3, ER/PR Negative, Her 2 Neg Ratio: 1.05; Ki 67: 20%; T1bN0 Stage 1A Clinical stage      12/24/2016 Genetic Testing    The patient had genetic testing due to a personal and family history of breast cancer.  The [facility] Common Hereditary Cancer Panel + Melanoma panel was sent out. The Hereditary Gene Panel + Melanoma Panel offered by [facility] includes sequencing and/or deletion duplication testing of the following 53 genes: APC, ATM, AXIN2, BAP1, BARD1, BMPR1A, BRCA1, BRCA2, BRIP1, CDH1, CDK4, CDKN2A (p14ARF), CDKN2A (p16INK4a), CHEK2, CTNNA1, DICER1, EPCAM (Deletion/duplication testing only), GREM1 (promoter region deletion/duplication testing only), KIT, MC1R, MEN1, MLH1, MSH2, MSH3, MSH6, MUTYH, NBN, NF1, NHTL1, PALB2, PDGFRA, PMS2, POLD1, POLE, POT1, PTEN, RAD50, RAD51C, RAD51D, RB1, SDHB, SDHC, SDHD, SMAD4, SMARCA4. STK11, TERT, TP53, TSC1, TSC2, and VHL.  The following genes were evaluated for sequence changes only: MITF, SDHA and HOXB13 c.251G>A variant only.  Results: No pathogenic mutations identified. 3 VU'sS in BRCA1 c.2967T>A (p.Phe989Leu) , MSH6 c.2857G>A (p.Glu953Lys), and TERT c.902G>A (p.Arg301His) were identified.  The date of this test result is 12/24/2016.        12/28/2016  Surgery    Right lumpectomy: IDC grade 2, 1.2 cm, broadly 0.1 cm to the posterior margin, 0/5 lymph nodes negative, ER 0%, PR 0%, HER-2 negative ratio 1.05, Ki-67 5%, T1 cN0 stage IB AJCC 8      01/25/2017 -  Chemotherapy    Adjuvant chemotherapy with dose dense Adriamycin and Cytoxan 4 followed by Taxol weekly 12        CHIEF COMPLIANT: cycle 3 dose dense Adriamycin and Cytoxan  INTERVAL HISTORY: Lorraine Dean is a 51-year-old with above-mentioned is a right breast cancer treated with lumpectomy and is currently on adjuvant chemotherapy with dose dense Adriamycin and Cytoxan. Today is cycle 3 of treatment. Overall she felt that she tolerated cycle 2 much better. Although fatigue lasted more days than before. She did not have any nausea vomiting.  REVIEW OF SYSTEMS:   Constitutional: Denies fevers, chills or abnormal weight loss, alopecia Eyes: Denies blurriness of vision Ears, nose, mouth, throat, and face: Denies mucositis or sore throat Respiratory: Denies cough, dyspnea or wheezes Cardiovascular: Denies palpitation, chest discomfort Gastrointestinal:  Denies nausea, heartburn or change in bowel habits Skin: Denies abnormal skin rashes Lymphatics: Denies new lymphadenopathy or easy bruising Neurological:Denies numbness, tingling or new weaknesses Behavioral/Psych: Mood is stable, no new changes  Extremities: No lower extremity edema Breast:  denies any pain or lumps or nodules in either breasts All other systems were reviewed with the patient and are negative.  I have reviewed the past medical history, past surgical history, social history and family history with the patient and they are unchanged from previous note.  ALLERGIES:  has No Known Allergies.  MEDICATIONS:  Current Outpatient Prescriptions  Medication Sig Dispense Refill  .   acetaminophen (TYLENOL) 500 MG tablet Take 500 mg by mouth every 6 (six) hours as needed.    Marland Kitchen albuterol (PROVENTIL HFA;VENTOLIN HFA) 108  (90 Base) MCG/ACT inhaler Inhale 1-2 puffs into the lungs every 4 (four) hours as needed for wheezing or shortness of breath.    Marland Kitchen aspirin EC 81 MG tablet Take 81 mg by mouth daily.    . Biotin 1000 MCG tablet Take 1,000 mcg by mouth 3 (three) times daily.    . calcium-vitamin D (OSCAL WITH D) 500-200 MG-UNIT tablet Take 1 tablet by mouth.    . dexamethasone (DECADRON) 4 MG tablet Take 1 tablet (4 mg total) by mouth daily. Take 1 tablet day after chemotherapy and one more tablet the following day with food 8 tablet 0  . fluticasone (FLONASE) 50 MCG/ACT nasal spray Place 1 spray into both nostrils as needed for allergies or rhinitis.    [name] Oil 1000 MG CAPS Take by mouth.    . lidocaine-prilocaine (EMLA) cream Apply to affected area once 30 g 3  . LORazepam (ATIVAN) 0.5 MG tablet Take 1 tablet (0.5 mg total) by mouth at bedtime. 30 tablet 0  . Multiple Vitamins-Minerals (OCUVITE ADULT FORMULA) CAPS Take by mouth.    . ondansetron (ZOFRAN) 8 MG tablet Take 1 tablet (8 mg total) by mouth 2 (two) times daily as needed. Start on the third day after chemotherapy. 30 tablet 1  . prochlorperazine (COMPAZINE) 10 MG tablet Take 1 tablet (10 mg total) by mouth every 6 (six) hours as needed (Nausea or vomiting). 30 tablet 1   No current facility-administered medications for this visit.    Facility-Administered Medications Ordered in Other Visits  Medication Dose Route Frequency Provider Last Rate Last Dose  . cyclophosphamide (CYTOXAN) 860 mg in sodium chloride 0.9 % 250 mL chemo infusion  600 mg/m2 (Treatment Plan Recorded) Intravenous Once Nicholas Lose, MD 586 mL/hr at 02/22/17 1441 860 mg at 02/22/17 1441  . heparin lock flush 100 unit/mL  500 Units Intracatheter Once PRN Nicholas Lose, MD      . pegfilgrastim (NEULASTA ONPRO KIT) injection 6 mg  6 mg Subcutaneous Once Nicholas Lose, MD      . sodium chloride flush (NS) 0.9 % injection 10 mL  10 mL Intracatheter PRN Nicholas Lose, MD         PHYSICAL EXAMINATION: ECOG PERFORMANCE STATUS: 1 - Symptomatic but completely ambulatory  Vitals:   02/22/17 1119  BP: (!) 119/41  Pulse: 83  Resp: 18  Temp: 98.7 F (37.1 C)  SpO2: 99%   Filed Weights   02/22/17 1119  Weight: 101 lb 4.8 oz (45.9 kg)    GENERAL:alert, no distress and comfortable SKIN: skin color, texture, turgor are normal, no rashes or significant lesions EYES: normal, Conjunctiva are pink and non-injected, sclera clear OROPHARYNX:no exudate, no erythema and lips, buccal mucosa, and tongue normal  NECK: supple, thyroid normal size, non-tender, without nodularity LYMPH:  no palpable lymphadenopathy in the cervical, axillary or inguinal LUNGS: clear to auscultation and percussion with normal breathing effort HEART: regular rate & rhythm and no murmurs and no lower extremity edema ABDOMEN:abdomen soft, non-tender and normal bowel sounds MUSCULOSKELETAL:no cyanosis of digits and no clubbing  NEURO: alert & oriented x 3 with fluent speech, no focal motor/sensory deficits EXTREMITIES: No lower extremity edema  LABORATORY DATA:  I have reviewed the data as listed   Chemistry      Component Value Date/Time   NA 141 02/22/2017 1040  K 3.8 02/22/2017 1040   CO2 25 02/22/2017 1040   BUN 6.7 (L) 02/22/2017 1040   CREATININE 0.7 02/22/2017 1040      Component Value Date/Time   CALCIUM 9.1 02/22/2017 1040   ALKPHOS 80 02/22/2017 1040   AST 23 02/22/2017 1040   ALT 32 02/22/2017 1040   BILITOT <0.22 02/22/2017 1040       Lab Results  Component Value Date   WBC 5.5 02/22/2017   HGB 12.8 02/22/2017   HCT 38.1 02/22/2017   MCV 88.7 02/22/2017   PLT 200 02/22/2017   NEUTROABS 4.0 02/22/2017    ASSESSMENT & PLAN:  Malignant neoplasm of upper-outer quadrant of right breast in female, estrogen receptor positive ([place]) 12/28/2016: Right lumpectomy: IDC grade 2, 1.2 cm, broadly 0.1 cm to the posterior margin, 0/5 lymph nodes negative, ER 0%, PR 0%, HER-2  negative ratio 1.05, Ki-67 5%, T1 cN0 stage IB AJCC 8  Recommendation: 1. Adjuvant chemotherapy with dose dense Adriamycin and Cytoxan every 2 weeks 4 followed by weekly Taxol 12 2. followed by adjuvant radiation therapy ---------------------------------------------------------------------- Current treatment: Cycle 3 day 1 dose dense Adriamycin Cytoxan  Chemotherapy toxicities: 1. Nausea grade 1 2. episode of severe anxiety: resolved 3. Severe fatigue 4. Neutropenia as expected for nadir count  Return to clinic in one week for cycle 4   I spent 25 minutes talking to the patient of which more than half was spent in counseling and coordination of care.  No orders of the defined types were placed in this encounter.  The patient has a good understanding of the overall plan. she agrees with it. she will call with any problems that may develop before the next visit here.   Rulon Eisenmenger, MD 02/22/17

## 2017-03-08 ENCOUNTER — Ambulatory Visit: Payer: BLUE CROSS/BLUE SHIELD

## 2017-03-08 ENCOUNTER — Ambulatory Visit (HOSPITAL_BASED_OUTPATIENT_CLINIC_OR_DEPARTMENT_OTHER): Payer: BLUE CROSS/BLUE SHIELD

## 2017-03-08 ENCOUNTER — Other Ambulatory Visit (HOSPITAL_BASED_OUTPATIENT_CLINIC_OR_DEPARTMENT_OTHER): Payer: BLUE CROSS/BLUE SHIELD

## 2017-03-08 ENCOUNTER — Ambulatory Visit (HOSPITAL_BASED_OUTPATIENT_CLINIC_OR_DEPARTMENT_OTHER): Payer: BLUE CROSS/BLUE SHIELD | Admitting: Adult Health

## 2017-03-08 ENCOUNTER — Encounter: Payer: Self-pay | Admitting: Adult Health

## 2017-03-08 ENCOUNTER — Other Ambulatory Visit: Payer: Self-pay

## 2017-03-08 VITALS — BP 116/51 | HR 95 | Temp 98.4°F | Resp 18 | Ht 60.0 in | Wt 99.4 lb

## 2017-03-08 DIAGNOSIS — C50411 Malignant neoplasm of upper-outer quadrant of right female breast: Secondary | ICD-10-CM

## 2017-03-08 DIAGNOSIS — Z95828 Presence of other vascular implants and grafts: Secondary | ICD-10-CM

## 2017-03-08 DIAGNOSIS — D701 Agranulocytosis secondary to cancer chemotherapy: Secondary | ICD-10-CM

## 2017-03-08 DIAGNOSIS — Z5111 Encounter for antineoplastic chemotherapy: Secondary | ICD-10-CM

## 2017-03-08 DIAGNOSIS — Z170 Estrogen receptor positive status [ER+]: Principal | ICD-10-CM

## 2017-03-08 DIAGNOSIS — R110 Nausea: Secondary | ICD-10-CM | POA: Diagnosis not present

## 2017-03-08 LAB — COMPREHENSIVE METABOLIC PANEL
ALT: 20 U/L (ref 0–55)
ANION GAP: 10 meq/L (ref 3–11)
AST: 19 U/L (ref 5–34)
Albumin: 4 g/dL (ref 3.5–5.0)
Alkaline Phosphatase: 80 U/L (ref 40–150)
BUN: 8.9 mg/dL (ref 7.0–26.0)
CALCIUM: 9.6 mg/dL (ref 8.4–10.4)
CO2: 27 meq/L (ref 22–29)
CREATININE: 0.7 mg/dL (ref 0.6–1.1)
Chloride: 105 mEq/L (ref 98–109)
EGFR: >60 mL/min/{1.73_m2} (ref >60)
Glucose: 88 mg/dl (ref 70–140)
Potassium: 4.1 mEq/L (ref 3.5–5.1)
Sodium: 142 mEq/L (ref 136–145)
TOTAL PROTEIN: 7.2 g/dL (ref 6.4–8.3)

## 2017-03-08 LAB — CBC WITH DIFFERENTIAL/PLATELET
BASO%: 1 % (ref 0.0–2.0)
Basophils Absolute: 0.1 10*3/uL (ref 0.0–0.1)
EOS%: 0.1 % (ref 0.0–7.0)
Eosinophils Absolute: 0 10*3/uL (ref 0.0–0.5)
HEMATOCRIT: 38.6 % (ref 34.8–46.6)
HGB: 12.9 g/dL (ref 11.6–15.9)
LYMPH#: 0.8 10*3/uL — AB (ref 0.9–3.3)
LYMPH%: 7.6 % — ABNORMAL LOW (ref 14.0–49.7)
MCH: 30.3 pg (ref 25.1–34.0)
MCHC: 33.4 g/dL (ref 31.5–36.0)
MCV: 90.6 fL (ref 79.5–101.0)
MONO#: 1.1 10*3/uL — AB (ref 0.1–0.9)
MONO%: 10.1 % (ref 0.0–14.0)
NEUT%: 81.2 % — ABNORMAL HIGH (ref 38.4–76.8)
NEUTROS ABS: 9 10*3/uL — AB (ref 1.5–6.5)
PLATELETS: 181 10*3/uL (ref 145–400)
RBC: 4.26 10*6/uL (ref 3.70–5.45)
RDW: 15.6 % — AB (ref 11.2–14.5)
WBC: 11 10*3/uL — AB (ref 3.9–10.3)

## 2017-03-08 MED ORDER — LIDOCAINE-PRILOCAINE 2.5-2.5 % EX CREA: TOPICAL_CREAM | CUTANEOUS | 3 refills | Status: DC

## 2017-03-08 MED ORDER — DEXAMETHASONE 4 MG PO TABS: 4.0000 mg | ORAL_TABLET | Freq: Every day | ORAL | 0 refills | Status: DC

## 2017-03-08 MED ORDER — ONDANSETRON HCL 8 MG PO TABS: 8.0000 mg | ORAL_TABLET | Freq: Two times a day (BID) | ORAL | 1 refills | Status: DC | PRN

## 2017-03-08 MED ORDER — SODIUM CHLORIDE 0.9% FLUSH
10.0000 mL | INTRAVENOUS | Status: DC | PRN
  Administered 2017-03-08: 10 mL via INTRAVENOUS
  Filled 2017-03-08: qty 10

## 2017-03-08 MED ORDER — PALONOSETRON HCL INJECTION 0.25 MG/5ML
0.2500 mg | Freq: Once | INTRAVENOUS | Status: AC
  Administered 2017-03-08: 0.25 mg via INTRAVENOUS

## 2017-03-08 MED ORDER — ALTEPLASE 2 MG IJ SOLR
2.0000 mg | Freq: Once | INTRAMUSCULAR | Status: AC | PRN
  Administered 2017-03-08: 2 mg
  Filled 2017-03-08: qty 2

## 2017-03-08 MED ORDER — PEGFILGRASTIM 6 MG/0.6ML ~~LOC~~ PSKT
6.0000 mg | PREFILLED_SYRINGE | Freq: Once | SUBCUTANEOUS | Status: AC
  Administered 2017-03-08: 6 mg via SUBCUTANEOUS
  Filled 2017-03-08: qty 0.6

## 2017-03-08 MED ORDER — SODIUM CHLORIDE 0.9% FLUSH
10.0000 mL | INTRAVENOUS | Status: DC | PRN
  Administered 2017-03-08: 10 mL
  Filled 2017-03-08: qty 10

## 2017-03-08 MED ORDER — HEPARIN SOD (PORK) LOCK FLUSH 100 UNIT/ML IV SOLN
500.0000 [IU] | Freq: Once | INTRAVENOUS | Status: AC | PRN
  Administered 2017-03-08: 500 [IU]
  Filled 2017-03-08: qty 5

## 2017-03-08 MED ORDER — DOXORUBICIN HCL CHEMO IV INJECTION 2 MG/ML
60.0000 mg/m2 | Freq: Once | INTRAVENOUS | Status: AC
  Administered 2017-03-08: 86 mg via INTRAVENOUS
  Filled 2017-03-08: qty 43

## 2017-03-08 MED ORDER — SODIUM CHLORIDE 0.9 % IV SOLN
600.0000 mg/m2 | Freq: Once | INTRAVENOUS | Status: AC
  Administered 2017-03-08: 860 mg via INTRAVENOUS
  Filled 2017-03-08: qty 43

## 2017-03-08 MED ORDER — PALONOSETRON HCL INJECTION 0.25 MG/5ML
INTRAVENOUS | Status: AC
  Filled 2017-03-08: qty 5

## 2017-03-08 MED ORDER — FOSAPREPITANT DIMEGLUMINE INJECTION 150 MG
Freq: Once | INTRAVENOUS | Status: AC
  Administered 2017-03-08: 12:00:00 via INTRAVENOUS
  Filled 2017-03-08: qty 5

## 2017-03-08 MED ORDER — SODIUM CHLORIDE 0.9 % IV SOLN
Freq: Once | INTRAVENOUS | Status: AC
  Administered 2017-03-08: 12:00:00 via INTRAVENOUS

## 2017-03-08 MED ORDER — PROCHLORPERAZINE MALEATE 10 MG PO TABS: 10.0000 mg | ORAL_TABLET | Freq: Four times a day (QID) | ORAL | 1 refills | Status: DC | PRN

## 2017-03-08 NOTE — Patient Instructions (Signed)
[facility] Discharge Instructions for Patients Receiving Chemotherapy  Today you received the following chemotherapy agents: Cyclophosphamid, Adriamycin  To help prevent nausea and vomiting after your treatment, we encourage you to take your nausea medication as prescribed.  If you develop nausea and vomiting that is not controlled by your nausea medication, call the clinic.   BELOW ARE SYMPTOMS THAT SHOULD BE REPORTED IMMEDIATELY:  *FEVER GREATER THAN 100.5 F  *CHILLS WITH OR WITHOUT FEVER  NAUSEA AND VOMITING THAT IS NOT CONTROLLED WITH YOUR NAUSEA MEDICATION  *UNUSUAL SHORTNESS OF BREATH  *UNUSUAL BRUISING OR BLEEDING  TENDERNESS IN MOUTH AND THROAT WITH OR WITHOUT PRESENCE OF ULCERS  *URINARY PROBLEMS  *BOWEL PROBLEMS  UNUSUAL RASH Items with * indicate a potential emergency and should be followed up as soon as possible.  Feel free to call the clinic should you have any questions or concerns. The clinic phone number is [phone].  Please show the [facility] at check-in to the Emergency Department and triage nurse.

## 2017-03-08 NOTE — Progress Notes (Signed)
Blood return noted before, every 3mls and after adriamycin push.  

## 2017-03-08 NOTE — Progress Notes (Signed)
Port-A-Cath accessed by LPN. No blood return noted.  No swelling or pain notwed when port flushed with NS. Cath -Flo instilled per policy. Site marked. Will monitor for blood return.

## 2017-03-08 NOTE — Assessment & Plan Note (Signed)
12/28/2016: Right lumpectomy: IDC grade 2, 1.2 cm, broadly 0.1 cm to the posterior margin, 0/5 lymph nodes negative, ER 0%, PR 0%, HER-2 negative ratio 1.05, Ki-67 5%, T1 cN0 stage IB AJCC 8  Recommendation: 1. Adjuvant chemotherapy with dose dense Adriamycin and Cytoxan every 2 weeks 4 followed by weekly Taxol 12 2. followed by adjuvant radiation therapy ---------------------------------------------------------------------- Current treatment: Cycle 4 day 1 dose dense Adriamycin Cytoxan  Chemotherapy toxicities: 1. Nausea grade 1 2. episode of severe anxiety: resolved 3. Severe fatigue 4. Neutropenia as expected for nadir count  She is tolerating chemotherapy well.  She will return in 2 weeks to start Taxol.  Return to clinic in two weeks for Taxol

## 2017-03-08 NOTE — Progress Notes (Signed)
[facility] Follow up:    Lorraine Pepper, MD [address] [id number]   DIAGNOSIS: Cancer Staging Malignant neoplasm of upper-outer quadrant of right breast in female, estrogen receptor positive ([place]) Staging form: Breast, AJCC 8th Edition - Clinical stage from 12/12/2016: Stage IB (cT1c, cN0, cM0, G3, ER: Negative, PR: Negative, HER2: Negative) - Unsigned   SUMMARY OF ONCOLOGIC HISTORY:   Malignant neoplasm of upper-outer quadrant of right breast in female, estrogen receptor positive ([place])   12/05/2016 Initial Diagnosis    Screening detected calcifications Rt breast spanning 1.5 cm, U/S measured a lesion 1.2 cm size, Biopsy revealed IDC Grade 3, ER/PR Negative, Her 2 Neg Ratio: 1.05; Ki 67: 20%; T1bN0 Stage 1A Clinical stage      12/24/2016 Genetic Testing    The patient had genetic testing due to a personal and family history of breast cancer.  The [facility] Common Hereditary Cancer Panel + Melanoma panel was sent out. The Hereditary Gene Panel + Melanoma Panel offered by [facility] includes sequencing and/or deletion duplication testing of the following 53 genes: APC, ATM, AXIN2, BAP1, BARD1, BMPR1A, BRCA1, BRCA2, BRIP1, CDH1, CDK4, CDKN2A (p14ARF), CDKN2A (p16INK4a), CHEK2, CTNNA1, DICER1, EPCAM (Deletion/duplication testing only), GREM1 (promoter region deletion/duplication testing only), KIT, MC1R, MEN1, MLH1, MSH2, MSH3, MSH6, MUTYH, NBN, NF1, NHTL1, PALB2, PDGFRA, PMS2, POLD1, POLE, POT1, PTEN, RAD50, RAD51C, RAD51D, RB1, SDHB, SDHC, SDHD, SMAD4, SMARCA4. STK11, TERT, TP53, TSC1, TSC2, and VHL.  The following genes were evaluated for sequence changes only: MITF, SDHA and HOXB13 c.251G>A variant only.  Results: No pathogenic mutations identified. 3 VU'sS in BRCA1 c.2967T>A (p.Phe989Leu) , MSH6 c.2857G>A (p.Glu953Lys), and TERT c.902G>A (p.Arg301His) were identified.  The date of this test result is 12/24/2016.        12/28/2016 Surgery    Right  lumpectomy: IDC grade 2, 1.2 cm, broadly 0.1 cm to the posterior margin, 0/5 lymph nodes negative, ER 0%, PR 0%, HER-2 negative ratio 1.05, Ki-67 5%, T1 cN0 stage IB AJCC 8      01/25/2017 -  Chemotherapy    Adjuvant chemotherapy with dose dense Adriamycin and Cytoxan 4 followed by Taxol weekly 12        CURRENT THERAPY: Adriamycin/Cytoxan cycle 4  INTERVAL HISTORY: Lorraine Dean 51 y.o. female returns for evaluation prior to receiving chemotherapy with AC. She is tolerating it well and has had no issues.  She is taking her anti emetics for her nausea.     Patient Active Problem List   Diagnosis Date Noted  . Port catheter in place 01/25/2017  . Preoperative clearance 12/24/2016  . Palpitations 12/24/2016  . Hyperlipidemia 12/24/2016  . Family history of early CAD 12/24/2016  . Genetic testing 12/20/2016  . Family history of breast cancer 12/13/2016  . Family history of lung cancer 12/13/2016  . Malignant neoplasm of upper-outer quadrant of right breast in female, estrogen receptor positive ([place]) 12/11/2016    has No Known Allergies.  MEDICAL HISTORY: Past Medical History:  Diagnosis Date  . Anxiety   . Cancer [facility])    right breast cancer  . Chronic rhinitis   . Depression   . Family history of breast cancer 12/13/2016  . Family history of lung cancer 12/13/2016  . High cholesterol   . HPV in female   . PONV (postoperative nausea and vomiting)     SURGICAL HISTORY: Past Surgical History:  Procedure Laterality Date  . BREAST BIOPSY Right 2018   triple negative IDC  grade 3 tumor  . BREAST LUMPECTOMY WITH RADIOACTIVE SEED AND SENTINEL LYMPH NODE BIOPSY Right 12/28/2016   Procedure: RIGHT BREAST LUMPECTOMY WITH RADIOACTIVE SEED AND SENTINEL LYMPH NODE MAPPING ERAS PATHWAY;  Surgeon: [name], MD;  Location: [facility];  Service: General;  Laterality: Right;  PEC BLOCK  . PORTACATH PLACEMENT Left 01/18/2017   Procedure: INSERTION PORT-A-CATH;   Surgeon: [name], MD;  Location: [facility];  Service: General;  Laterality: Left;  . WISDOM TOOTH EXTRACTION      SOCIAL HISTORY: Social History   Social History  . Marital status: Single    Spouse name: N/A  . Number of children: N/A  . Years of education: N/A   Occupational History  . Not on file.   Social History Main Topics  . Smoking status: Never Smoker  . Smokeless tobacco: Never Used  . Alcohol use Yes     Comment: social  . Drug use: No  . Sexual activity: Yes    Birth control/ protection: None     Comment: Intercourse 1 month ago was on the pill,then stoppedl   Other Topics Concern  . Not on file   Social History Narrative  . No narrative on file    FAMILY HISTORY: Family History  Problem Relation Age of Onset  . Heart disease Father 43       CABG  . Breast cancer Maternal Aunt 60  . Lung cancer Maternal Aunt        60's. unsure if it was a primary LG or a met from breast recurrence.  She had a brain met as well.  She died at 26.  . Lung cancer Paternal Aunt        dx. late 64's, died in 42's.  Marland Kitchen Heart disease Paternal Grandmother   . Heart disease Paternal Grandfather     Review of Systems  Constitutional: Negative for appetite change, chills, fatigue, fever and unexpected weight change.  HENT:   Negative for hearing loss and lump/mass.   Eyes: Negative for eye problems and icterus.  Respiratory: Negative for chest tightness, cough and shortness of breath.   Cardiovascular: Negative for chest pain, leg swelling and palpitations.  Gastrointestinal: Negative for abdominal distention, abdominal pain, constipation, diarrhea, nausea and vomiting.  Endocrine: Negative for hot flashes.  Musculoskeletal: Negative for arthralgias.  Skin: Negative for itching and rash.  Neurological: Negative for dizziness, extremity weakness, headaches and numbness.  Hematological: Negative for adenopathy. Does not bruise/bleed easily.   Psychiatric/Behavioral: Negative for depression. The patient is not nervous/anxious.       PHYSICAL EXAMINATION  ECOG PERFORMANCE STATUS: 1 - Symptomatic but completely ambulatory  Vitals:   03/08/17 1058  BP: (!) 116/51  Pulse: 95  Resp: 18  Temp: 98.4 F (36.9 C)  SpO2: 99%    Physical Exam  Constitutional: She is oriented to person, place, and time and well-developed, well-nourished, and in no distress.  HENT:  Head: Normocephalic and atraumatic.  Mouth/Throat: Oropharynx is clear and moist. No oropharyngeal exudate.  Eyes: Pupils are equal, round, and reactive to light. No scleral icterus.  Neck: Neck supple.  Cardiovascular: Normal rate, regular rhythm and normal heart sounds.   Pulmonary/Chest: Effort normal and breath sounds normal.  Abdominal: Soft. Bowel sounds are normal. She exhibits no distension and no mass. There is no tenderness. There is no rebound and no guarding.  Lymphadenopathy:    She has no cervical adenopathy.  Neurological: She is alert  and oriented to person, place, and time.  Psychiatric: Mood and affect normal.    LABORATORY DATA:  CBC    Component Value Date/Time   WBC 11.0 (H) 03/08/2017 1004   RBC 4.26 03/08/2017 1004   HGB 12.9 03/08/2017 1004   HCT 38.6 03/08/2017 1004   PLT 181 03/08/2017 1004   MCV 90.6 03/08/2017 1004   MCH 30.3 03/08/2017 1004   MCHC 33.4 03/08/2017 1004   RDW 15.6 (H) 03/08/2017 1004   LYMPHSABS 0.8 (L) 03/08/2017 1004   MONOABS 1.1 (H) 03/08/2017 1004   EOSABS 0.0 03/08/2017 1004   BASOSABS 0.1 03/08/2017 1004    CMP     Component Value Date/Time   NA 142 03/08/2017 1004   K 4.1 03/08/2017 1004   CO2 27 03/08/2017 1004   GLUCOSE 88 03/08/2017 1004   BUN 8.9 03/08/2017 1004   CREATININE 0.7 03/08/2017 1004   CALCIUM 9.6 03/08/2017 1004   PROT 7.2 03/08/2017 1004   ALBUMIN 4.0 03/08/2017 1004   AST 19 03/08/2017 1004   ALT 20 03/08/2017 1004   ALKPHOS 80 03/08/2017 1004   BILITOT <0.22  03/08/2017 1004       ASSESSMENT and PLAN:   Malignant neoplasm of upper-outer quadrant of right breast in female, estrogen receptor positive ([facility]) 12/28/2016: Right lumpectomy: IDC grade 2, 1.2 cm, broadly 0.1 cm to the posterior margin, 0/5 lymph nodes negative, ER 0%, PR 0%, HER-2 negative ratio 1.05, Ki-67 5%, T1 cN0 stage IB AJCC 8  Recommendation: 1. Adjuvant chemotherapy with dose dense Adriamycin and Cytoxan every 2 weeks 4 followed by weekly Taxol 12 2. followed by adjuvant radiation therapy ---------------------------------------------------------------------- Current treatment: Cycle 4 day 1 dose dense Adriamycin Cytoxan  Chemotherapy toxicities: 1. Nausea grade 1 2. episode of severe anxiety: resolved 3. Severe fatigue 4. Neutropenia as expected for nadir count  She is tolerating chemotherapy well.  She will return in 2 weeks to start Taxol.  Return to clinic in two weeks for Taxol   All questions were answered. The patient knows to call the clinic with any problems, questions or concerns. We can certainly see the patient much sooner if necessary.  A total of (30) minutes of face-to-face time was spent with this patient with greater than 50% of that time in counseling and care-coordination.  This note was electronically signed. Scot Dock, NP 03/08/2017

## 2017-03-11 ENCOUNTER — Other Ambulatory Visit: Payer: Self-pay

## 2017-03-11 ENCOUNTER — Ambulatory Visit (HOSPITAL_BASED_OUTPATIENT_CLINIC_OR_DEPARTMENT_OTHER): Payer: BLUE CROSS/BLUE SHIELD

## 2017-03-11 ENCOUNTER — Telehealth: Payer: Self-pay

## 2017-03-11 VITALS — BP 117/50 | HR 78 | Temp 97.9°F | Resp 20

## 2017-03-11 DIAGNOSIS — D701 Agranulocytosis secondary to cancer chemotherapy: Secondary | ICD-10-CM

## 2017-03-11 DIAGNOSIS — C50911 Malignant neoplasm of unspecified site of right female breast: Secondary | ICD-10-CM

## 2017-03-11 DIAGNOSIS — C50411 Malignant neoplasm of upper-outer quadrant of right female breast: Secondary | ICD-10-CM

## 2017-03-11 MED ORDER — PEGFILGRASTIM INJECTION 6 MG/0.6ML ~~LOC~~
6.0000 mg | PREFILLED_SYRINGE | Freq: Once | SUBCUTANEOUS | Status: DC
  Administered 2017-03-11: 6 mg via SUBCUTANEOUS
  Filled 2017-03-11: qty 0.6

## 2017-03-11 MED ORDER — PEGFILGRASTIM INJECTION 6 MG/0.6ML ~~LOC~~
6.0000 mg | PREFILLED_SYRINGE | Freq: Once | SUBCUTANEOUS | Status: AC
  Filled 2017-03-11: qty 0.6

## 2017-03-11 NOTE — Telephone Encounter (Signed)
Pt called that her onpro stopped working. It beeped and did green light starting to inject. Then it stopped, and beeped and red light came on. She called on call MD. She took it off. She has a bruise on her abdomen.  She thinks she got about 10-15 minutes of injection.   S/w Dr Lindi Adie and scheduled pt for injection appt at 1330. Did instruct pt to bring in malfunctioned onpro. Schedule inbasket sent.

## 2017-03-11 NOTE — Patient Instructions (Signed)
Pegfilgrastim injection What is this medicine? PEGFILGRASTIM (PEG fil gra stim) is a long-acting granulocyte colony-stimulating factor that stimulates the growth of neutrophils, a type of white blood cell important in the body's fight against infection. It is used to reduce the incidence of fever and infection in patients with certain types of cancer who are receiving chemotherapy that affects the bone marrow, and to increase survival after being exposed to high doses of radiation. This medicine may be used for other purposes; ask your health care provider or pharmacist if you have questions. COMMON BRAND NAME(S): Neulasta What should I tell my health care provider before I take this medicine? They need to know if you have any of these conditions: -kidney disease -latex allergy -ongoing radiation therapy -sickle cell disease -skin reactions to acrylic adhesives (On-Body Injector only) -an unusual or allergic reaction to pegfilgrastim, filgrastim, other medicines, foods, dyes, or preservatives -pregnant or trying to get pregnant -breast-feeding How should I use this medicine? This medicine is for injection under the skin. If you get this medicine at home, you will be taught how to prepare and give the pre-filled syringe or how to use the On-body Injector. Refer to the patient Instructions for Use for detailed instructions. Use exactly as directed. Tell your healthcare provider immediately if you suspect that the On-body Injector may not have performed as intended or if you suspect the use of the On-body Injector resulted in a missed or partial dose. It is important that you put your used needles and syringes in a special sharps container. Do not put them in a trash can. If you do not have a sharps container, call your pharmacist or healthcare provider to get one. Talk to your pediatrician regarding the use of this medicine in children. While this drug may be prescribed for selected conditions,  precautions do apply. Overdosage: If you think you have taken too much of this medicine contact a poison control center or emergency room at once. NOTE: This medicine is only for you. Do not share this medicine with others. What if I miss a dose? It is important not to miss your dose. Call your doctor or health care professional if you miss your dose. If you miss a dose due to an On-body Injector failure or leakage, a new dose should be administered as soon as possible using a single prefilled syringe for manual use. What may interact with this medicine? Interactions have not been studied. Give your health care provider a list of all the medicines, herbs, non-prescription drugs, or dietary supplements you use. Also tell them if you smoke, drink alcohol, or use illegal drugs. Some items may interact with your medicine. This list may not describe all possible interactions. Give your health care provider a list of all the medicines, herbs, non-prescription drugs, or dietary supplements you use. Also tell them if you smoke, drink alcohol, or use illegal drugs. Some items may interact with your medicine. What should I watch for while using this medicine? You may need blood work done while you are taking this medicine. If you are going to need a MRI, CT scan, or other procedure, tell your doctor that you are using this medicine (On-Body Injector only). What side effects may I notice from receiving this medicine? Side effects that you should report to your doctor or health care professional as soon as possible: -allergic reactions like skin rash, itching or hives, swelling of the face, lips, or tongue -dizziness -fever -pain, redness, or irritation at site   where injected -pinpoint red spots on the skin -red or dark-brown urine -shortness of breath or breathing problems -stomach or side pain, or pain at the shoulder -swelling -tiredness -trouble passing urine or change in the amount of urine Side  effects that usually do not require medical attention (report to your doctor or health care professional if they continue or are bothersome): -bone pain -muscle pain This list may not describe all possible side effects. Call your doctor for medical advice about side effects. You may report side effects to FDA at 1-800-FDA-1088. Where should I keep my medicine? Keep out of the reach of children. Store pre-filled syringes in a refrigerator between 2 and 8 degrees C (36 and 46 degrees F). Do not freeze. Keep in carton to protect from light. Throw away this medicine if it is left out of the refrigerator for more than 48 hours. Throw away any unused medicine after the expiration date. NOTE: This sheet is a summary. It may not cover all possible information. If you have questions about this medicine, talk to your doctor, pharmacist, or health care provider.  2018 Elsevier/Gold Standard (2016-04-19 12:58:03)  

## 2017-03-15 ENCOUNTER — Telehealth: Payer: Self-pay | Admitting: Adult Health

## 2017-03-15 NOTE — Telephone Encounter (Signed)
Per 11/2 - no los at check

## 2017-03-22 ENCOUNTER — Other Ambulatory Visit (HOSPITAL_BASED_OUTPATIENT_CLINIC_OR_DEPARTMENT_OTHER): Payer: BLUE CROSS/BLUE SHIELD

## 2017-03-22 ENCOUNTER — Ambulatory Visit (HOSPITAL_BASED_OUTPATIENT_CLINIC_OR_DEPARTMENT_OTHER): Payer: BLUE CROSS/BLUE SHIELD | Admitting: Hematology and Oncology

## 2017-03-22 ENCOUNTER — Ambulatory Visit (HOSPITAL_BASED_OUTPATIENT_CLINIC_OR_DEPARTMENT_OTHER): Payer: BLUE CROSS/BLUE SHIELD

## 2017-03-22 ENCOUNTER — Ambulatory Visit: Payer: BLUE CROSS/BLUE SHIELD

## 2017-03-22 VITALS — BP 120/68 | HR 82 | Temp 98.3°F | Resp 18

## 2017-03-22 VITALS — BP 120/41 | HR 80 | Temp 98.1°F | Resp 18 | Ht 60.0 in | Wt 101.3 lb

## 2017-03-22 DIAGNOSIS — Z5111 Encounter for antineoplastic chemotherapy: Secondary | ICD-10-CM | POA: Diagnosis not present

## 2017-03-22 DIAGNOSIS — Z170 Estrogen receptor positive status [ER+]: Principal | ICD-10-CM

## 2017-03-22 DIAGNOSIS — R110 Nausea: Secondary | ICD-10-CM

## 2017-03-22 DIAGNOSIS — C50411 Malignant neoplasm of upper-outer quadrant of right female breast: Secondary | ICD-10-CM

## 2017-03-22 DIAGNOSIS — R530 Neoplastic (malignant) related fatigue: Secondary | ICD-10-CM

## 2017-03-22 DIAGNOSIS — Z95828 Presence of other vascular implants and grafts: Secondary | ICD-10-CM

## 2017-03-22 LAB — COMPREHENSIVE METABOLIC PANEL
ALT: 32 U/L (ref 0–55)
ANION GAP: 10 meq/L (ref 3–11)
AST: 28 U/L (ref 5–34)
Albumin: 3.8 g/dL (ref 3.5–5.0)
Alkaline Phosphatase: 93 U/L (ref 40–150)
BUN: 8 mg/dL (ref 7.0–26.0)
CO2: 25 meq/L (ref 22–29)
CREATININE: 0.7 mg/dL (ref 0.6–1.1)
Calcium: 9.2 mg/dL (ref 8.4–10.4)
Chloride: 108 mEq/L (ref 98–109)
EGFR: >60 mL/min/{1.73_m2} (ref >60)
Glucose: 82 mg/dl (ref 70–140)
Potassium: 3.4 mEq/L — ABNORMAL LOW (ref 3.5–5.1)
Sodium: 144 mEq/L (ref 136–145)
TOTAL PROTEIN: 6.7 g/dL (ref 6.4–8.3)

## 2017-03-22 LAB — CBC WITH DIFFERENTIAL/PLATELET
BASO%: 0.9 % (ref 0.0–2.0)
BASOS ABS: 0.1 10*3/uL (ref 0.0–0.1)
EOS%: 0.1 % (ref 0.0–7.0)
Eosinophils Absolute: 0 10*3/uL (ref 0.0–0.5)
HCT: 34.1 % — ABNORMAL LOW (ref 34.8–46.6)
HEMOGLOBIN: 11.3 g/dL — AB (ref 11.6–15.9)
LYMPH%: 6.3 % — AB (ref 14.0–49.7)
MCH: 30.1 pg (ref 25.1–34.0)
MCHC: 33.2 g/dL (ref 31.5–36.0)
MCV: 90.8 fL (ref 79.5–101.0)
MONO#: 0.8 10*3/uL (ref 0.1–0.9)
MONO%: 7.2 % (ref 0.0–14.0)
NEUT#: 10 10*3/uL — ABNORMAL HIGH (ref 1.5–6.5)
NEUT%: 85.5 % — AB (ref 38.4–76.8)
Platelets: 162 10*3/uL (ref 145–400)
RBC: 3.75 10*6/uL (ref 3.70–5.45)
RDW: 17.4 % — AB (ref 11.2–14.5)
WBC: 11.7 10*3/uL — ABNORMAL HIGH (ref 3.9–10.3)
lymph#: 0.7 10*3/uL — ABNORMAL LOW (ref 0.9–3.3)

## 2017-03-22 MED ORDER — FAMOTIDINE IN NACL 20-0.9 MG/50ML-% IV SOLN
20.0000 mg | Freq: Once | INTRAVENOUS | Status: AC
  Administered 2017-03-22: 20 mg via INTRAVENOUS

## 2017-03-22 MED ORDER — DIPHENHYDRAMINE HCL 50 MG/ML IJ SOLN
INTRAMUSCULAR | Status: AC
  Filled 2017-03-22: qty 1

## 2017-03-22 MED ORDER — DIPHENHYDRAMINE HCL 50 MG/ML IJ SOLN
25.0000 mg | Freq: Once | INTRAMUSCULAR | Status: AC
  Administered 2017-03-22: 25 mg via INTRAVENOUS

## 2017-03-22 MED ORDER — SODIUM CHLORIDE 0.9 % IV SOLN
20.0000 mg | Freq: Once | INTRAVENOUS | Status: AC
  Administered 2017-03-22: 20 mg via INTRAVENOUS
  Filled 2017-03-22: qty 2

## 2017-03-22 MED ORDER — SODIUM CHLORIDE 0.9% FLUSH
10.0000 mL | INTRAVENOUS | Status: DC | PRN
  Administered 2017-03-22: 10 mL via INTRAVENOUS
  Filled 2017-03-22: qty 10

## 2017-03-22 MED ORDER — SODIUM CHLORIDE 0.9 % IV SOLN
80.0000 mg/m2 | Freq: Once | INTRAVENOUS | Status: AC
  Administered 2017-03-22: 114 mg via INTRAVENOUS
  Filled 2017-03-22: qty 19

## 2017-03-22 MED ORDER — HEPARIN SOD (PORK) LOCK FLUSH 100 UNIT/ML IV SOLN
500.0000 [IU] | Freq: Once | INTRAVENOUS | Status: AC | PRN
  Administered 2017-03-22: 500 [IU]
  Filled 2017-03-22: qty 5

## 2017-03-22 MED ORDER — SODIUM CHLORIDE 0.9% FLUSH
10.0000 mL | INTRAVENOUS | Status: DC | PRN
  Administered 2017-03-22: 10 mL
  Filled 2017-03-22: qty 10

## 2017-03-22 MED ORDER — DIPHENHYDRAMINE HCL 50 MG/ML IJ SOLN: 50.0000 mg | Freq: Once | INTRAMUSCULAR | Status: DC

## 2017-03-22 MED ORDER — SODIUM CHLORIDE 0.9 % IV SOLN
Freq: Once | INTRAVENOUS | Status: AC
  Administered 2017-03-22: 09:00:00 via INTRAVENOUS

## 2017-03-22 MED ORDER — FAMOTIDINE IN NACL 20-0.9 MG/50ML-% IV SOLN
INTRAVENOUS | Status: AC
  Filled 2017-03-22: qty 50

## 2017-03-22 NOTE — Progress Notes (Signed)
Patient Care Team: [name], MD as PCP - General (Family Medicine) Nicholas Lose, MD as Consulting Physician (Hematology and Oncology) [name], MD as Consulting Physician (General Surgery) Gery Pray, MD as Consulting Physician (Radiation Oncology)  DIAGNOSIS:  Encounter Diagnosis  Name Primary?  . Malignant neoplasm of upper-outer quadrant of right breast in female, estrogen receptor positive ([place]) Yes    SUMMARY OF ONCOLOGIC HISTORY:   Malignant neoplasm of upper-outer quadrant of right breast in female, estrogen receptor positive ([place])   12/05/2016 Initial Diagnosis    Screening detected calcifications Rt breast spanning 1.5 cm, U/S measured a lesion 1.2 cm size, Biopsy revealed IDC Grade 3, ER/PR Negative, Her 2 Neg Ratio: 1.05; Ki 67: 20%; T1bN0 Stage 1A Clinical stage      12/24/2016 Genetic Testing    The patient had genetic testing due to a personal and family history of breast cancer.  The [facility] Common Hereditary Cancer Panel + Melanoma panel was sent out. The Hereditary Gene Panel + Melanoma Panel offered by [facility] includes sequencing and/or deletion duplication testing of the following 53 genes: APC, ATM, AXIN2, BAP1, BARD1, BMPR1A, BRCA1, BRCA2, BRIP1, CDH1, CDK4, CDKN2A (p14ARF), CDKN2A (p16INK4a), CHEK2, CTNNA1, DICER1, EPCAM (Deletion/duplication testing only), GREM1 (promoter region deletion/duplication testing only), KIT, MC1R, MEN1, MLH1, MSH2, MSH3, MSH6, MUTYH, NBN, NF1, NHTL1, PALB2, PDGFRA, PMS2, POLD1, POLE, POT1, PTEN, RAD50, RAD51C, RAD51D, RB1, SDHB, SDHC, SDHD, SMAD4, SMARCA4. STK11, TERT, TP53, TSC1, TSC2, and VHL.  The following genes were evaluated for sequence changes only: MITF, SDHA and HOXB13 c.251G>A variant only.  Results: No pathogenic mutations identified. 3 VU'sS in BRCA1 c.2967T>A (p.Phe989Leu) , MSH6 c.2857G>A (p.Glu953Lys), and TERT c.902G>A (p.Arg301His) were identified.  The date of this test result is 12/24/2016.        12/28/2016 Surgery    Right lumpectomy: IDC grade 2, 1.2 cm, broadly 0.1 cm to the posterior margin, 0/5 lymph nodes negative, ER 0%, PR 0%, HER-2 negative ratio 1.05, Ki-67 5%, T1 cN0 stage IB AJCC 8      01/25/2017 -  Chemotherapy    Adjuvant chemotherapy with dose dense Adriamycin and Cytoxan 4 followed by Taxol weekly 12        CHIEF COMPLIANT: Cycle 4 dose dense Adriamycin and Cytoxan  INTERVAL HISTORY: Lorraine Dean is a 51 year old with above-mentioned history of right breast cancer who is here and adjuvant chemotherapy and today cycle 4 of dose dense Adriamycin and Cytoxan.  She appears to be tolerating the chemo extremely well.  She does have fatigue related to the treatment but overall she has been doing quite good.  REVIEW OF SYSTEMS:   Constitutional: Denies fevers, chills or abnormal weight loss Eyes: Denies blurriness of vision Ears, nose, mouth, throat, and face: Denies mucositis or sore throat Respiratory: Denies cough, dyspnea or wheezes Cardiovascular: Denies palpitation, chest discomfort Gastrointestinal:  Denies nausea, heartburn or change in bowel habits Skin: Denies abnormal skin rashes Lymphatics: Denies new lymphadenopathy or easy bruising Neurological:Denies numbness, tingling or new weaknesses Behavioral/Psych: Mood is stable, no new changes  Extremities: No lower extremity edema Breast:  denies any pain or lumps or nodules in either breasts All other systems were reviewed with the patient and are negative.  I have reviewed the past medical history, past surgical history, social history and family history with the patient and they are unchanged from previous note.  ALLERGIES:  has No Known Allergies.  MEDICATIONS:  Current Outpatient Medications  Medication Sig Dispense Refill  . acetaminophen (TYLENOL)  500 MG tablet Take 500 mg by mouth every 6 (six) hours as needed.    Marland Kitchen albuterol (PROVENTIL HFA;VENTOLIN HFA) 108 (90 Base) MCG/ACT inhaler Inhale  1-2 puffs into the lungs every 4 (four) hours as needed for wheezing or shortness of breath.    Marland Kitchen aspirin EC 81 MG tablet Take 81 mg by mouth daily.    . Biotin 1000 MCG tablet Take 1,000 mcg by mouth 3 (three) times daily.    . calcium-vitamin D (OSCAL WITH D) 500-200 MG-UNIT tablet Take 1 tablet by mouth.    . dexamethasone (DECADRON) 4 MG tablet Take 1 tablet (4 mg total) by mouth daily. Take 1 tablet day after chemotherapy and one more tablet the following day with food 8 tablet 0  . fluticasone (FLONASE) 50 MCG/ACT nasal spray Place 1 spray into both nostrils as needed for allergies or rhinitis.    [name] Oil 1000 MG CAPS Take by mouth.    . lidocaine-prilocaine (EMLA) cream Apply to affected area once 30 g 3  . LORazepam (ATIVAN) 0.5 MG tablet Take 1 tablet (0.5 mg total) by mouth at bedtime. 30 tablet 0  . Multiple Vitamins-Minerals (OCUVITE ADULT FORMULA) CAPS Take by mouth.    . ondansetron (ZOFRAN) 8 MG tablet Take 1 tablet (8 mg total) by mouth 2 (two) times daily as needed. Start on the third day after chemotherapy. 30 tablet 1  . prochlorperazine (COMPAZINE) 10 MG tablet Take 1 tablet (10 mg total) by mouth every 6 (six) hours as needed (Nausea or vomiting). 30 tablet 1   No current facility-administered medications for this visit.    Facility-Administered Medications Ordered in Other Visits  Medication Dose Route Frequency Provider Last Rate Last Dose  . sodium chloride flush (NS) 0.9 % injection 10 mL  10 mL Intracatheter PRN Nicholas Lose, MD   10 mL at 03/22/17 1338    PHYSICAL EXAMINATION: ECOG PERFORMANCE STATUS: 1 - Symptomatic but completely ambulatory  Vitals:   03/22/17 0830  BP: (!) 120/41  Pulse: 80  Resp: 18  Temp: 98.1 F (36.7 C)  SpO2: 100%   Filed Weights   03/22/17 0830  Weight: 101 lb 4.8 oz (45.9 kg)    GENERAL:alert, no distress and comfortable SKIN: skin color, texture, turgor are normal, no rashes or significant lesions EYES: normal,  Conjunctiva are pink and non-injected, sclera clear OROPHARYNX:no exudate, no erythema and lips, buccal mucosa, and tongue normal  NECK: supple, thyroid normal size, non-tender, without nodularity LYMPH:  no palpable lymphadenopathy in the cervical, axillary or inguinal LUNGS: clear to auscultation and percussion with normal breathing effort HEART: regular rate & rhythm and no murmurs and no lower extremity edema ABDOMEN:abdomen soft, non-tender and normal bowel sounds MUSCULOSKELETAL:no cyanosis of digits and no clubbing  NEURO: alert & oriented x 3 with fluent speech, no focal motor/sensory deficits EXTREMITIES: No lower extremity edema  LABORATORY DATA:  I have reviewed the data as listed   Chemistry      Component Value Date/Time   NA 144 03/22/2017 0755   K 3.4 (L) 03/22/2017 0755   CO2 25 03/22/2017 0755   BUN 8.0 03/22/2017 0755   CREATININE 0.7 03/22/2017 0755      Component Value Date/Time   CALCIUM 9.2 03/22/2017 0755   ALKPHOS 93 03/22/2017 0755   AST 28 03/22/2017 0755   ALT 32 03/22/2017 0755   BILITOT <0.22 03/22/2017 0755       Lab Results  Component Value Date  WBC 11.7 (H) 03/22/2017   HGB 11.3 (L) 03/22/2017   HCT 34.1 (L) 03/22/2017   MCV 90.8 03/22/2017   PLT 162 03/22/2017   NEUTROABS 10.0 (H) 03/22/2017    ASSESSMENT & PLAN:  Malignant neoplasm of upper-outer quadrant of right breast in female, estrogen receptor positive ([place]) 12/28/2016: Right lumpectomy: IDC grade 2, 1.2 cm, broadly 0.1 cm to the posterior margin, 0/5 lymph nodes negative, ER 0%, PR 0%, HER-2 negative ratio 1.05, Ki-67 5%, T1 cN0 stage IB AJCC 8  Recommendation: 1. Adjuvant chemotherapy with dose dense Adriamycin and Cytoxan every 2 weeks 4 followed by weekly Taxol 12 2. followed by adjuvant radiation therapy ---------------------------------------------------------------------- Current treatment: Cycle 4 day 1dose dense Adriamycin Cytoxan  Chemotherapy toxicities: 1.  Nausea grade 1 2. episode of severe anxiety: resolved 3. Severe fatigue 4. Neutropenia as expected for nadir count  Return to clinic in 2 weeks for cycle 1 Taxol   I spent 25 minutes talking to the patient of which more than half was spent in counseling and coordination of care.  Orders Placed This Encounter  Procedures  . CBC with Differential    Standing Status:   Standing    Number of Occurrences:   20    Standing Expiration Date:   03/23/2018  . Comprehensive metabolic panel    Standing Status:   Standing    Number of Occurrences:   20    Standing Expiration Date:   03/23/2018   The patient has a good understanding of the overall plan. she agrees with it. she will call with any problems that may develop before the next visit here.   Rulon Eisenmenger, MD 03/22/17

## 2017-03-22 NOTE — Assessment & Plan Note (Signed)
12/28/2016: Right lumpectomy: IDC grade 2, 1.2 cm, broadly 0.1 cm to the posterior margin, 0/5 lymph nodes negative, ER 0%, PR 0%, HER-2 negative ratio 1.05, Ki-67 5%, T1 cN0 stage IB AJCC 8  Recommendation: 1. Adjuvant chemotherapy with dose dense Adriamycin and Cytoxan every 2 weeks 4 followed by weekly Taxol 12 2. followed by adjuvant radiation therapy ---------------------------------------------------------------------- Current treatment: Cycle 4 day 1dose dense Adriamycin Cytoxan  Chemotherapy toxicities: 1. Nausea grade 1 2. episode of severe anxiety: resolved 3. Severe fatigue 4. Neutropenia as expected for nadir count  Return to clinic in 2 weeks for cycle 1 Taxol

## 2017-03-22 NOTE — Patient Instructions (Addendum)
[facility] Discharge Instructions for Patients Receiving Chemotherapy  Today you received the following chemotherapy agents Taxol  To help prevent nausea and vomiting after your treatment, we encourage you to take your nausea medication as prescribed.   If you develop nausea and vomiting that is not controlled by your nausea medication, call the clinic.   BELOW ARE SYMPTOMS THAT SHOULD BE REPORTED IMMEDIATELY:  *FEVER GREATER THAN 100.5 F  *CHILLS WITH OR WITHOUT FEVER  NAUSEA AND VOMITING THAT IS NOT CONTROLLED WITH YOUR NAUSEA MEDICATION  *UNUSUAL SHORTNESS OF BREATH  *UNUSUAL BRUISING OR BLEEDING  TENDERNESS IN MOUTH AND THROAT WITH OR WITHOUT PRESENCE OF ULCERS  *URINARY PROBLEMS  *BOWEL PROBLEMS  UNUSUAL RASH Items with * indicate a potential emergency and should be followed up as soon as possible.  Feel free to call the clinic should you have any questions or concerns. The clinic phone number is [phone].  Please show the CHEMO ALERT CARD at check-in to the Emergency Department and triage nurse.  Paclitaxel injection (Taxol) What is this medicine? PACLITAXEL (PAK li TAX el) is a chemotherapy drug. It targets fast dividing cells, like cancer cells, and causes these cells to die. This medicine is used to treat ovarian cancer, breast cancer, and other cancers. This medicine may be used for other purposes; ask your health care provider or pharmacist if you have questions. COMMON BRAND NAME(S): Onxol, Taxol What should I tell my health care provider before I take this medicine? They need to know if you have any of these conditions: -blood disorders -irregular heartbeat -infection (especially a virus infection such as chickenpox, cold sores, or herpes) -liver disease -previous or ongoing radiation therapy -an unusual or allergic reaction to paclitaxel, alcohol, polyoxyethylated castor oil, other chemotherapy agents, other medicines, foods, dyes, or  preservatives -pregnant or trying to get pregnant -breast-feeding How should I use this medicine? This drug is given as an infusion into a vein. It is administered in a hospital or clinic by a specially trained health care professional. Talk to your pediatrician regarding the use of this medicine in children. Special care may be needed. Overdosage: If you think you have taken too much of this medicine contact a poison control center or emergency room at once. NOTE: This medicine is only for you. Do not share this medicine with others. What if I miss a dose? It is important not to miss your dose. Call your doctor or health care professional if you are unable to keep an appointment. What may interact with this medicine? Do not take this medicine with any of the following medications: -disulfiram -metronidazole This medicine may also interact with the following medications: -cyclosporine -diazepam -ketoconazole -medicines to increase blood counts like filgrastim, pegfilgrastim, sargramostim -other chemotherapy drugs like cisplatin, doxorubicin, epirubicin, etoposide, teniposide, vincristine -quinidine -testosterone -vaccines -verapamil Talk to your doctor or health care professional before taking any of these medicines: -acetaminophen -aspirin -ibuprofen -ketoprofen -naproxen This list may not describe all possible interactions. Give your health care provider a list of all the medicines, herbs, non-prescription drugs, or dietary supplements you use. Also tell them if you smoke, drink alcohol, or use illegal drugs. Some items may interact with your medicine. What should I watch for while using this medicine? Your condition will be monitored carefully while you are receiving this medicine. You will need important blood work done while you are taking this medicine. This medicine can cause serious allergic reactions. To reduce your risk you will need to   take other medicine(s) before  treatment with this medicine. If you experience allergic reactions like skin rash, itching or hives, swelling of the face, lips, or tongue, tell your doctor or health care professional right away. In some cases, you may be given additional medicines to help with side effects. Follow all directions for their use. This drug may make you feel generally unwell. This is not uncommon, as chemotherapy can affect healthy cells as well as cancer cells. Report any side effects. Continue your course of treatment even though you feel ill unless your doctor tells you to stop. Call your doctor or health care professional for advice if you get a fever, chills or sore throat, or other symptoms of a cold or flu. Do not treat yourself. This drug decreases your body's ability to fight infections. Try to avoid being around people who are sick. This medicine may increase your risk to bruise or bleed. Call your doctor or health care professional if you notice any unusual bleeding. Be careful brushing and flossing your teeth or using a toothpick because you may get an infection or bleed more easily. If you have any dental work done, tell your dentist you are receiving this medicine. Avoid taking products that contain aspirin, acetaminophen, ibuprofen, naproxen, or ketoprofen unless instructed by your doctor. These medicines may hide a fever. Do not become pregnant while taking this medicine. Women should inform their doctor if they wish to become pregnant or think they might be pregnant. There is a potential for serious side effects to an unborn child. Talk to your health care professional or pharmacist for more information. Do not breast-feed an infant while taking this medicine. Men are advised not to father a child while receiving this medicine. This product may contain alcohol. Ask your pharmacist or healthcare provider if this medicine contains alcohol. Be sure to tell all healthcare providers you are taking this medicine.  Certain medicines, like metronidazole and disulfiram, can cause an unpleasant reaction when taken with alcohol. The reaction includes flushing, headache, nausea, vomiting, sweating, and increased thirst. The reaction can last from 30 minutes to several hours. What side effects may I notice from receiving this medicine? Side effects that you should report to your doctor or health care professional as soon as possible: -allergic reactions like skin rash, itching or hives, swelling of the face, lips, or tongue -low blood counts - This drug may decrease the number of white blood cells, red blood cells and platelets. You may be at increased risk for infections and bleeding. -signs of infection - fever or chills, cough, sore throat, pain or difficulty passing urine -signs of decreased platelets or bleeding - bruising, pinpoint red spots on the skin, black, tarry stools, nosebleeds -signs of decreased red blood cells - unusually weak or tired, fainting spells, lightheadedness -breathing problems -chest pain -high or low blood pressure -mouth sores -nausea and vomiting -pain, swelling, redness or irritation at the injection site -pain, tingling, numbness in the hands or feet -slow or irregular heartbeat -swelling of the ankle, feet, hands Side effects that usually do not require medical attention (report to your doctor or health care professional if they continue or are bothersome): -bone pain -complete hair loss including hair on your head, underarms, pubic hair, eyebrows, and eyelashes -changes in the color of fingernails -diarrhea -loosening of the fingernails -loss of appetite -muscle or joint pain -red flush to skin -sweating This list may not describe all possible side effects. Call your doctor for medical advice about   side effects. You may report side effects to FDA at 1-800-FDA-1088. Where should I keep my medicine? This drug is given in a hospital or clinic and will not be stored at  home. NOTE: This sheet is a summary. It may not cover all possible information. If you have questions about this medicine, talk to your doctor, pharmacist, or health care provider.  2018 Elsevier/Gold Standard (2015-02-22 19:58:00)  

## 2017-03-29 ENCOUNTER — Ambulatory Visit (HOSPITAL_BASED_OUTPATIENT_CLINIC_OR_DEPARTMENT_OTHER): Payer: BLUE CROSS/BLUE SHIELD | Admitting: Adult Health

## 2017-03-29 ENCOUNTER — Encounter: Payer: Self-pay | Admitting: *Deleted

## 2017-03-29 ENCOUNTER — Ambulatory Visit: Payer: BLUE CROSS/BLUE SHIELD

## 2017-03-29 ENCOUNTER — Encounter: Payer: Self-pay | Admitting: Adult Health

## 2017-03-29 ENCOUNTER — Ambulatory Visit (HOSPITAL_BASED_OUTPATIENT_CLINIC_OR_DEPARTMENT_OTHER): Payer: BLUE CROSS/BLUE SHIELD

## 2017-03-29 ENCOUNTER — Other Ambulatory Visit (HOSPITAL_BASED_OUTPATIENT_CLINIC_OR_DEPARTMENT_OTHER): Payer: BLUE CROSS/BLUE SHIELD

## 2017-03-29 VITALS — BP 115/47 | HR 79 | Temp 97.8°F | Resp 18 | Ht 60.0 in | Wt 100.7 lb

## 2017-03-29 DIAGNOSIS — C50411 Malignant neoplasm of upper-outer quadrant of right female breast: Secondary | ICD-10-CM

## 2017-03-29 DIAGNOSIS — L603 Nail dystrophy: Secondary | ICD-10-CM

## 2017-03-29 DIAGNOSIS — Z5111 Encounter for antineoplastic chemotherapy: Secondary | ICD-10-CM

## 2017-03-29 DIAGNOSIS — Z170 Estrogen receptor positive status [ER+]: Principal | ICD-10-CM

## 2017-03-29 DIAGNOSIS — Z95828 Presence of other vascular implants and grafts: Secondary | ICD-10-CM

## 2017-03-29 LAB — CBC WITH DIFFERENTIAL/PLATELET
BASO%: 2.3 % — AB (ref 0.0–2.0)
Basophils Absolute: 0.1 10*3/uL (ref 0.0–0.1)
EOS%: 2.3 % (ref 0.0–7.0)
Eosinophils Absolute: 0.1 10*3/uL (ref 0.0–0.5)
HEMATOCRIT: 35.4 % (ref 34.8–46.6)
HGB: 11.7 g/dL (ref 11.6–15.9)
LYMPH#: 0.7 10*3/uL — AB (ref 0.9–3.3)
LYMPH%: 13.7 % — ABNORMAL LOW (ref 14.0–49.7)
MCH: 30.4 pg (ref 25.1–34.0)
MCHC: 33.1 g/dL (ref 31.5–36.0)
MCV: 91.9 fL (ref 79.5–101.0)
MONO#: 0.5 10*3/uL (ref 0.1–0.9)
MONO%: 10.1 % (ref 0.0–14.0)
NEUT%: 71.6 % (ref 38.4–76.8)
NEUTROS ABS: 3.4 10*3/uL (ref 1.5–6.5)
PLATELETS: 264 10*3/uL (ref 145–400)
RBC: 3.85 10*6/uL (ref 3.70–5.45)
RDW: 16.7 % — ABNORMAL HIGH (ref 11.2–14.5)
WBC: 4.7 10*3/uL (ref 3.9–10.3)

## 2017-03-29 LAB — COMPREHENSIVE METABOLIC PANEL
ALBUMIN: 3.9 g/dL (ref 3.5–5.0)
ALK PHOS: 68 U/L (ref 40–150)
ALT: 106 U/L — ABNORMAL HIGH (ref 0–55)
AST: 35 U/L — ABNORMAL HIGH (ref 5–34)
Anion Gap: 9 mEq/L (ref 3–11)
BUN: 8.9 mg/dL (ref 7.0–26.0)
CHLORIDE: 107 meq/L (ref 98–109)
CO2: 25 mEq/L (ref 22–29)
Calcium: 9.5 mg/dL (ref 8.4–10.4)
Creatinine: 0.6 mg/dL (ref 0.6–1.1)
GLUCOSE: 81 mg/dL (ref 70–140)
POTASSIUM: 3.5 meq/L (ref 3.5–5.1)
SODIUM: 142 meq/L (ref 136–145)
Total Bilirubin: 0.22 mg/dL (ref 0.20–1.20)
Total Protein: 6.8 g/dL (ref 6.4–8.3)

## 2017-03-29 MED ORDER — DIPHENHYDRAMINE HCL 50 MG/ML IJ SOLN
INTRAMUSCULAR | Status: AC
Start: 2017-03-29 — End: ?
  Filled 2017-03-29: qty 1

## 2017-03-29 MED ORDER — DIPHENHYDRAMINE HCL 50 MG/ML IJ SOLN
25.0000 mg | Freq: Once | INTRAMUSCULAR | Status: AC
  Administered 2017-03-29: 25 mg via INTRAVENOUS

## 2017-03-29 MED ORDER — HEPARIN SOD (PORK) LOCK FLUSH 100 UNIT/ML IV SOLN
500.0000 [IU] | Freq: Once | INTRAVENOUS | Status: AC | PRN
  Administered 2017-03-29: 500 [IU]
  Filled 2017-03-29: qty 5

## 2017-03-29 MED ORDER — SODIUM CHLORIDE 0.9 % IV SOLN
80.0000 mg/m2 | Freq: Once | INTRAVENOUS | Status: AC
  Administered 2017-03-29: 114 mg via INTRAVENOUS
  Filled 2017-03-29: qty 19

## 2017-03-29 MED ORDER — SODIUM CHLORIDE 0.9% FLUSH
10.0000 mL | INTRAVENOUS | Status: DC | PRN
Start: 2017-03-29 — End: 2017-03-29
  Filled 2017-03-29: qty 10

## 2017-03-29 MED ORDER — SODIUM CHLORIDE 0.9 % IV SOLN
20.0000 mg | Freq: Once | INTRAVENOUS | Status: AC
  Administered 2017-03-29: 20 mg via INTRAVENOUS
  Filled 2017-03-29: qty 2

## 2017-03-29 MED ORDER — FAMOTIDINE IN NACL 20-0.9 MG/50ML-% IV SOLN
INTRAVENOUS | Status: AC
  Filled 2017-03-29: qty 50

## 2017-03-29 MED ORDER — FAMOTIDINE IN NACL 20-0.9 MG/50ML-% IV SOLN
20.0000 mg | Freq: Once | INTRAVENOUS | Status: AC
  Administered 2017-03-29: 20 mg via INTRAVENOUS

## 2017-03-29 MED ORDER — SODIUM CHLORIDE 0.9% FLUSH
10.0000 mL | INTRAVENOUS | Status: DC | PRN
  Administered 2017-03-29: 10 mL
  Filled 2017-03-29: qty 10

## 2017-03-29 MED ORDER — SODIUM CHLORIDE 0.9 % IV SOLN
Freq: Once | INTRAVENOUS | Status: AC
  Administered 2017-03-29: 10:00:00 via INTRAVENOUS

## 2017-03-29 NOTE — Patient Instructions (Signed)
[facility] Discharge Instructions for Patients Receiving Chemotherapy  Today you received the following chemotherapy agents :  Taxol.  To help prevent nausea and vomiting after your treatment, we encourage you to take your nausea medication as prescribed.   If you develop nausea and vomiting that is not controlled by your nausea medication, call the clinic.   BELOW ARE SYMPTOMS THAT SHOULD BE REPORTED IMMEDIATELY:  *FEVER GREATER THAN 100.5 F  *CHILLS WITH OR WITHOUT FEVER  NAUSEA AND VOMITING THAT IS NOT CONTROLLED WITH YOUR NAUSEA MEDICATION  *UNUSUAL SHORTNESS OF BREATH  *UNUSUAL BRUISING OR BLEEDING  TENDERNESS IN MOUTH AND THROAT WITH OR WITHOUT PRESENCE OF ULCERS  *URINARY PROBLEMS  *BOWEL PROBLEMS  UNUSUAL RASH Items with * indicate a potential emergency and should be followed up as soon as possible.  Feel free to call the clinic should you have any questions or concerns. The clinic phone number is [phone].  Please show the CHEMO ALERT CARD at check-in to the Emergency Department and triage nurse.   

## 2017-03-29 NOTE — Assessment & Plan Note (Signed)
12/28/2016: Right lumpectomy: IDC grade 2, 1.2 cm, broadly 0.1 cm to the posterior margin, 0/5 lymph nodes negative, ER 0%, PR 0%, HER-2 negative ratio 1.05, Ki-67 5%, T1 cN0 stage IB AJCC 8  Recommendation: 1. Adjuvant chemotherapy with dose dense Adriamycin and Cytoxan every 2 weeks 4 followed by weekly Taxol 12 2. followed by adjuvant radiation therapy ---------------------------------------------------------------------- Current treatment: Week 2 taxol  Chemotherapy toxicities: 1. Nail dyscrasia: This can happen with Taxol, no neuropathy noted.  Patient to moisturize and keep nails trimmed.    Lorraine Dean will return weekly for labs and Taxol, she will be seen by Dr. Lindi Adie or myself every other week to monitory for chemo toxicities.

## 2017-03-29 NOTE — Progress Notes (Addendum)
[facility] Follow up:    Lorraine Pepper, MD [address] [address]   DIAGNOSIS: Cancer Staging Malignant neoplasm of upper-outer quadrant of right breast in female, estrogen receptor positive ([place]) Staging form: Breast, AJCC 8th Edition - Clinical stage from 12/12/2016: Stage IB (cT1c, cN0, cM0, G3, ER: Negative, PR: Negative, HER2: Negative) - Unsigned   SUMMARY OF ONCOLOGIC HISTORY:   Malignant neoplasm of upper-outer quadrant of right breast in female, estrogen receptor positive ([place])   12/05/2016 Initial Diagnosis    Screening detected calcifications Rt breast spanning 1.5 cm, U/S measured a lesion 1.2 cm size, Biopsy revealed IDC Grade 3, ER/PR Negative, Her 2 Neg Ratio: 1.05; Ki 67: 20%; T1bN0 Stage 1A Clinical stage      12/24/2016 Genetic Testing    The patient had genetic testing due to a personal and family history of breast cancer.  The [facility] Common Hereditary Cancer Panel + Melanoma panel was sent out. The Hereditary Gene Panel + Melanoma Panel offered by [facility] includes sequencing and/or deletion duplication testing of the following 53 genes: APC, ATM, AXIN2, BAP1, BARD1, BMPR1A, BRCA1, BRCA2, BRIP1, CDH1, CDK4, CDKN2A (p14ARF), CDKN2A (p16INK4a), CHEK2, CTNNA1, DICER1, EPCAM (Deletion/duplication testing only), GREM1 (promoter region deletion/duplication testing only), KIT, MC1R, MEN1, MLH1, MSH2, MSH3, MSH6, MUTYH, NBN, NF1, NHTL1, PALB2, PDGFRA, PMS2, POLD1, POLE, POT1, PTEN, RAD50, RAD51C, RAD51D, RB1, SDHB, SDHC, SDHD, SMAD4, SMARCA4. STK11, TERT, TP53, TSC1, TSC2, and VHL.  The following genes were evaluated for sequence changes only: MITF, SDHA and HOXB13 c.251G>A variant only.  Results: No pathogenic mutations identified. 3 VU'sS in BRCA1 c.2967T>A (p.Phe989Leu) , MSH6 c.2857G>A (p.Glu953Lys), and TERT c.902G>A (p.Arg301His) were identified.  The date of this test result is 12/24/2016.        12/28/2016 Surgery    Right  lumpectomy: IDC grade 2, 1.2 cm, broadly 0.1 cm to the posterior margin, 0/5 lymph nodes negative, ER 0%, PR 0%, HER-2 negative ratio 1.05, Ki-67 5%, T1 cN0 stage IB AJCC 8      01/25/2017 -  Chemotherapy    Adjuvant chemotherapy with dose dense Adriamycin and Cytoxan 4 followed by Taxol weekly 12        CURRENT THERAPY: Paclitaxel week 2  INTERVAL HISTORY: Lorraine Dean 51 y.o. female returns for evaluation after receiving her first cycle of Paclitaxel.  She says she likes this chemotherapy a lot better than the Adriamycin/Cytoxan.  She is not as fatigued, and not as nauseated.  She denies peripheral neuropathy.  However she has noted darkening in the nail bed on her bilateral great toes with mild tenderness to the nail, in addition to darkening of the nail bed on her first digits of her hands bilaterally.     Patient Active Problem List   Diagnosis Date Noted  . Port catheter in place 01/25/2017  . Preoperative clearance 12/24/2016  . Palpitations 12/24/2016  . Hyperlipidemia 12/24/2016  . Family history of early CAD 12/24/2016  . Genetic testing 12/20/2016  . Family history of breast cancer 12/13/2016  . Family history of lung cancer 12/13/2016  . Malignant neoplasm of upper-outer quadrant of right breast in female, estrogen receptor positive ([place]) 12/11/2016    has No Known Allergies.  MEDICAL HISTORY: Past Medical History:  Diagnosis Date  . Anxiety   . Cancer [facility])    right breast cancer  . Chronic rhinitis   . Depression   . Family history of breast cancer 12/13/2016  . Family history  of lung cancer 12/13/2016  . High cholesterol   . HPV in female   . PONV (postoperative nausea and vomiting)     SURGICAL HISTORY: Past Surgical History:  Procedure Laterality Date  . BREAST BIOPSY Right 2018   triple negative IDC grade 3 tumor  . BREAST LUMPECTOMY WITH RADIOACTIVE SEED AND SENTINEL LYMPH NODE BIOPSY Right 12/28/2016   Procedure: RIGHT BREAST LUMPECTOMY WITH  RADIOACTIVE SEED AND SENTINEL LYMPH NODE MAPPING ERAS PATHWAY;  Surgeon: [name], MD;  Location: [facility];  Service: General;  Laterality: Right;  PEC BLOCK  . PORTACATH PLACEMENT Left 01/18/2017   Procedure: INSERTION PORT-A-CATH;  Surgeon: [name], MD;  Location: [place];  Service: General;  Laterality: Left;  . WISDOM TOOTH EXTRACTION      SOCIAL HISTORY: Social History   Socioeconomic History  . Marital status: Single    Spouse name: Not on file  . Number of children: Not on file  . Years of education: Not on file  . Highest education level: Not on file  Social Needs  . Financial resource strain: Not on file  . Food insecurity - worry: Not on file  . Food insecurity - inability: Not on file  . Transportation needs - medical: Not on file  . Transportation needs - non-medical: Not on file  Occupational History  . Not on file  Tobacco Use  . Smoking status: Never Smoker  . Smokeless tobacco: Never Used  Substance and Sexual Activity  . Alcohol use: Yes    Comment: social  . Drug use: No  . Sexual activity: Yes    Birth control/protection: None    Comment: Intercourse 1 month ago was on the pill,then stoppedl  Other Topics Concern  . Not on file  Social History Narrative  . Not on file    FAMILY HISTORY: Family History  Problem Relation Age of Onset  . Heart disease Father 66       CABG  . Breast cancer Maternal Aunt 60  . Lung cancer Maternal Aunt        60's. unsure if it was a primary LG or a met from breast recurrence.  She had a brain met as well.  She died at 17.  . Lung cancer Paternal Aunt        dx. late 74's, died in 73's.  Marland Kitchen Heart disease Paternal Grandmother   . Heart disease Paternal Grandfather     Review of Systems  Constitutional: Negative for appetite change, chills, fatigue, fever and unexpected weight change.  HENT:   Negative for hearing loss and lump/mass.   Eyes: Negative for eye problems and  icterus.  Respiratory: Negative for chest tightness, cough and shortness of breath.   Cardiovascular: Negative for chest pain, leg swelling and palpitations.  Gastrointestinal: Negative for abdominal distention, abdominal pain, constipation, diarrhea, nausea and vomiting.  Endocrine: Negative for hot flashes.  Musculoskeletal: Negative for arthralgias and myalgias.  Skin: Negative for itching and rash.  Neurological: Negative for dizziness, extremity weakness and headaches.  Hematological: Negative for adenopathy. Does not bruise/bleed easily.  Psychiatric/Behavioral: Negative for depression. The patient is not nervous/anxious.       PHYSICAL EXAMINATION  ECOG PERFORMANCE STATUS: 1 - Symptomatic but completely ambulatory  Vitals:   03/29/17 0858  BP: (!) 115/47  Pulse: 79  Resp: 18  Temp: 97.8 F (36.6 C)  SpO2: (!) 18%   Oxygen saturation is 100%, not 18%, that was entered  in error by the Nurse Tech Physical Exam  Constitutional: She is oriented to person, place, and time and well-developed, well-nourished, and in no distress.  HENT:  Head: Normocephalic and atraumatic.  Mouth/Throat: No oropharyngeal exudate.  Eyes: Pupils are equal, round, and reactive to light.  Neck: Neck supple.  Cardiovascular: Normal rate, regular rhythm and normal heart sounds.  Pulmonary/Chest: Effort normal and breath sounds normal.  Abdominal: Soft. Bowel sounds are normal. She exhibits no distension and no mass. There is no tenderness. There is no rebound and no guarding.  Lymphadenopathy:    She has no cervical adenopathy.  Neurological: She is alert and oriented to person, place, and time.  Skin: Skin is warm and dry.  Darkening of the nail bed of the bilateral first digits of hands and feet  Psychiatric: Mood and affect normal.    LABORATORY DATA:  CBC    Component Value Date/Time   WBC 4.7 03/29/2017 0753   RBC 3.85 03/29/2017 0753   HGB 11.7 03/29/2017 0753   HCT 35.4 03/29/2017  0753   PLT 264 03/29/2017 0753   MCV 91.9 03/29/2017 0753   MCH 30.4 03/29/2017 0753   MCHC 33.1 03/29/2017 0753   RDW 16.7 (H) 03/29/2017 0753   LYMPHSABS 0.7 (L) 03/29/2017 0753   MONOABS 0.5 03/29/2017 0753   EOSABS 0.1 03/29/2017 0753   BASOSABS 0.1 03/29/2017 0753    CMP     Component Value Date/Time   NA 144 03/22/2017 0755   K 3.4 (L) 03/22/2017 0755   CO2 25 03/22/2017 0755   GLUCOSE 82 03/22/2017 0755   BUN 8.0 03/22/2017 0755   CREATININE 0.7 03/22/2017 0755   CALCIUM 9.2 03/22/2017 0755   PROT 6.7 03/22/2017 0755   ALBUMIN 3.8 03/22/2017 0755   AST 28 03/22/2017 0755   ALT 32 03/22/2017 0755   ALKPHOS 93 03/22/2017 0755   BILITOT <0.22 03/22/2017 0755          ASSESSMENT and PLAN:   Malignant neoplasm of upper-outer quadrant of right breast in female, estrogen receptor positive ([place]) 12/28/2016: Right lumpectomy: IDC grade 2, 1.2 cm, broadly 0.1 cm to the posterior margin, 0/5 lymph nodes negative, ER 0%, PR 0%, HER-2 negative ratio 1.05, Ki-67 5%, T1 cN0 stage IB AJCC 8  Recommendation: 1. Adjuvant chemotherapy with dose dense Adriamycin and Cytoxan every 2 weeks 4 followed by weekly Taxol 12 2. followed by adjuvant radiation therapy ---------------------------------------------------------------------- Current treatment: Week 2 taxol  Chemotherapy toxicities: 1. Nail dyscrasia: This can happen with Taxol, no neuropathy noted.  Patient to moisturize and keep nails trimmed.    Jenifer will return weekly for labs and Taxol, she will be seen by Dr. Lindi Adie or myself every other week to monitory for chemo toxicities.      All questions were answered. The patient knows to call the clinic with any problems, questions or concerns. We can certainly see the patient much sooner if necessary.  A total of (20) minutes of face-to-face time was spent with this patient with greater than 50% of that time in counseling and care-coordination.  This note was  electronically signed. Scot Dock, NP 03/29/2017   Addendum: Received call from Hassan Rowan, RN about patient ALT of 106.  Spoke with Dr. Julien Nordmann and patient is OK to treat with an ALT of 106.

## 2017-04-05 ENCOUNTER — Ambulatory Visit (HOSPITAL_BASED_OUTPATIENT_CLINIC_OR_DEPARTMENT_OTHER): Payer: BLUE CROSS/BLUE SHIELD

## 2017-04-05 ENCOUNTER — Ambulatory Visit: Payer: BLUE CROSS/BLUE SHIELD

## 2017-04-05 ENCOUNTER — Other Ambulatory Visit (HOSPITAL_BASED_OUTPATIENT_CLINIC_OR_DEPARTMENT_OTHER): Payer: BLUE CROSS/BLUE SHIELD

## 2017-04-05 VITALS — BP 123/57 | HR 93 | Temp 98.5°F | Resp 17

## 2017-04-05 DIAGNOSIS — Z95828 Presence of other vascular implants and grafts: Secondary | ICD-10-CM

## 2017-04-05 DIAGNOSIS — Z5111 Encounter for antineoplastic chemotherapy: Secondary | ICD-10-CM

## 2017-04-05 DIAGNOSIS — Z170 Estrogen receptor positive status [ER+]: Principal | ICD-10-CM

## 2017-04-05 DIAGNOSIS — C50411 Malignant neoplasm of upper-outer quadrant of right female breast: Secondary | ICD-10-CM

## 2017-04-05 LAB — CBC WITH DIFFERENTIAL/PLATELET
BASO%: 2.8 % — AB (ref 0.0–2.0)
BASOS ABS: 0.1 10*3/uL (ref 0.0–0.1)
EOS ABS: 0.1 10*3/uL (ref 0.0–0.5)
EOS%: 2.7 % (ref 0.0–7.0)
HEMATOCRIT: 35.3 % (ref 34.8–46.6)
HGB: 11.8 g/dL (ref 11.6–15.9)
LYMPH#: 0.4 10*3/uL — AB (ref 0.9–3.3)
LYMPH%: 8.4 % — AB (ref 14.0–49.7)
MCH: 30.9 pg (ref 25.1–34.0)
MCHC: 33.3 g/dL (ref 31.5–36.0)
MCV: 92.7 fL (ref 79.5–101.0)
MONO#: 0.5 10*3/uL (ref 0.1–0.9)
MONO%: 10.6 % (ref 0.0–14.0)
NEUT#: 3.6 10*3/uL (ref 1.5–6.5)
NEUT%: 75.5 % (ref 38.4–76.8)
PLATELETS: 251 10*3/uL (ref 145–400)
RBC: 3.81 10*6/uL (ref 3.70–5.45)
RDW: 18.8 % — ABNORMAL HIGH (ref 11.2–14.5)
WBC: 4.8 10*3/uL (ref 3.9–10.3)

## 2017-04-05 LAB — COMPREHENSIVE METABOLIC PANEL
ALBUMIN: 3.8 g/dL (ref 3.5–5.0)
ALK PHOS: 70 U/L (ref 40–150)
ALT: 96 U/L — ABNORMAL HIGH (ref 0–55)
AST: 43 U/L — AB (ref 5–34)
Anion Gap: 10 mEq/L (ref 3–11)
BUN: 7.5 mg/dL (ref 7.0–26.0)
CHLORIDE: 108 meq/L (ref 98–109)
CO2: 22 meq/L (ref 22–29)
Calcium: 9.1 mg/dL (ref 8.4–10.4)
Creatinine: 0.7 mg/dL (ref 0.6–1.1)
Glucose: 99 mg/dl (ref 70–140)
Potassium: 3.7 mEq/L (ref 3.5–5.1)
Sodium: 140 mEq/L (ref 136–145)
Total Bilirubin: 0.23 mg/dL (ref 0.20–1.20)
Total Protein: 6.7 g/dL (ref 6.4–8.3)

## 2017-04-05 MED ORDER — SODIUM CHLORIDE 0.9% FLUSH
10.0000 mL | INTRAVENOUS | Status: DC | PRN
  Administered 2017-04-05: 10 mL via INTRAVENOUS
  Filled 2017-04-05: qty 10

## 2017-04-05 MED ORDER — DIPHENHYDRAMINE HCL 50 MG/ML IJ SOLN
25.0000 mg | Freq: Once | INTRAMUSCULAR | Status: AC
  Administered 2017-04-05: 25 mg via INTRAVENOUS

## 2017-04-05 MED ORDER — SODIUM CHLORIDE 0.9 % IV SOLN
80.0000 mg/m2 | Freq: Once | INTRAVENOUS | Status: AC
  Administered 2017-04-05: 114 mg via INTRAVENOUS
  Filled 2017-04-05: qty 19

## 2017-04-05 MED ORDER — FAMOTIDINE IN NACL 20-0.9 MG/50ML-% IV SOLN
20.0000 mg | Freq: Once | INTRAVENOUS | Status: AC
  Administered 2017-04-05: 20 mg via INTRAVENOUS

## 2017-04-05 MED ORDER — HEPARIN SOD (PORK) LOCK FLUSH 100 UNIT/ML IV SOLN
500.0000 [IU] | Freq: Once | INTRAVENOUS | Status: AC | PRN
  Administered 2017-04-05: 500 [IU]
  Filled 2017-04-05: qty 5

## 2017-04-05 MED ORDER — SODIUM CHLORIDE 0.9 % IV SOLN
20.0000 mg | Freq: Once | INTRAVENOUS | Status: AC
  Administered 2017-04-05: 20 mg via INTRAVENOUS
  Filled 2017-04-05: qty 2

## 2017-04-05 MED ORDER — SODIUM CHLORIDE 0.9% FLUSH
10.0000 mL | INTRAVENOUS | Status: DC | PRN
  Administered 2017-04-05: 10 mL
  Filled 2017-04-05: qty 10

## 2017-04-05 MED ORDER — DIPHENHYDRAMINE HCL 50 MG/ML IJ SOLN
INTRAMUSCULAR | Status: AC
  Filled 2017-04-05: qty 1

## 2017-04-05 MED ORDER — FAMOTIDINE IN NACL 20-0.9 MG/50ML-% IV SOLN
INTRAVENOUS | Status: AC
  Filled 2017-04-05: qty 50

## 2017-04-05 MED ORDER — SODIUM CHLORIDE 0.9 % IV SOLN
Freq: Once | INTRAVENOUS | Status: AC
  Administered 2017-04-05: 10:00:00 via INTRAVENOUS

## 2017-04-05 NOTE — Patient Instructions (Signed)
[facility] Discharge Instructions for Patients Receiving Chemotherapy  Today you received the following chemotherapy agents :  Taxol.  To help prevent nausea and vomiting after your treatment, we encourage you to take your nausea medication as prescribed.   If you develop nausea and vomiting that is not controlled by your nausea medication, call the clinic.   BELOW ARE SYMPTOMS THAT SHOULD BE REPORTED IMMEDIATELY:  *FEVER GREATER THAN 100.5 F  *CHILLS WITH OR WITHOUT FEVER  NAUSEA AND VOMITING THAT IS NOT CONTROLLED WITH YOUR NAUSEA MEDICATION  *UNUSUAL SHORTNESS OF BREATH  *UNUSUAL BRUISING OR BLEEDING  TENDERNESS IN MOUTH AND THROAT WITH OR WITHOUT PRESENCE OF ULCERS  *URINARY PROBLEMS  *BOWEL PROBLEMS  UNUSUAL RASH Items with * indicate a potential emergency and should be followed up as soon as possible.  Feel free to call the clinic should you have any questions or concerns. The clinic phone number is [phone].  Please show the CHEMO ALERT CARD at check-in to the Emergency Department and triage nurse.   

## 2017-04-08 ENCOUNTER — Telehealth: Payer: Self-pay | Admitting: Hematology and Oncology

## 2017-04-08 NOTE — Telephone Encounter (Signed)
04/08/2017 faxed medical records to [place] @ [phone] successfully @ 6:30 am

## 2017-04-12 ENCOUNTER — Ambulatory Visit (HOSPITAL_BASED_OUTPATIENT_CLINIC_OR_DEPARTMENT_OTHER): Payer: BLUE CROSS/BLUE SHIELD | Admitting: Hematology and Oncology

## 2017-04-12 ENCOUNTER — Ambulatory Visit (HOSPITAL_BASED_OUTPATIENT_CLINIC_OR_DEPARTMENT_OTHER): Payer: BLUE CROSS/BLUE SHIELD

## 2017-04-12 ENCOUNTER — Other Ambulatory Visit (HOSPITAL_BASED_OUTPATIENT_CLINIC_OR_DEPARTMENT_OTHER): Payer: BLUE CROSS/BLUE SHIELD

## 2017-04-12 ENCOUNTER — Ambulatory Visit: Payer: BLUE CROSS/BLUE SHIELD

## 2017-04-12 DIAGNOSIS — C50411 Malignant neoplasm of upper-outer quadrant of right female breast: Secondary | ICD-10-CM

## 2017-04-12 DIAGNOSIS — Z5111 Encounter for antineoplastic chemotherapy: Secondary | ICD-10-CM

## 2017-04-12 DIAGNOSIS — Z170 Estrogen receptor positive status [ER+]: Secondary | ICD-10-CM | POA: Diagnosis not present

## 2017-04-12 DIAGNOSIS — L603 Nail dystrophy: Secondary | ICD-10-CM | POA: Diagnosis not present

## 2017-04-12 DIAGNOSIS — Z95828 Presence of other vascular implants and grafts: Secondary | ICD-10-CM

## 2017-04-12 DIAGNOSIS — E876 Hypokalemia: Secondary | ICD-10-CM | POA: Diagnosis not present

## 2017-04-12 LAB — COMPREHENSIVE METABOLIC PANEL
ALT: 65 U/L — AB (ref 0–55)
ANION GAP: 12 meq/L — AB (ref 3–11)
AST: 30 U/L (ref 5–34)
Albumin: 3.6 g/dL (ref 3.5–5.0)
Alkaline Phosphatase: 75 U/L (ref 40–150)
BUN: 7.2 mg/dL (ref 7.0–26.0)
CALCIUM: 9.3 mg/dL (ref 8.4–10.4)
CHLORIDE: 108 meq/L (ref 98–109)
CO2: 22 meq/L (ref 22–29)
CREATININE: 0.7 mg/dL (ref 0.6–1.1)
EGFR: >60 mL/min/{1.73_m2} (ref >60)
Glucose: 105 mg/dl (ref 70–140)
POTASSIUM: 3.3 meq/L — AB (ref 3.5–5.1)
Sodium: 142 mEq/L (ref 136–145)
Total Bilirubin: 0.29 mg/dL (ref 0.20–1.20)
Total Protein: 6.7 g/dL (ref 6.4–8.3)

## 2017-04-12 LAB — CBC WITH DIFFERENTIAL/PLATELET
BASO%: 1 % (ref 0.0–2.0)
BASOS ABS: 0.1 10*3/uL (ref 0.0–0.1)
EOS ABS: 0.3 10*3/uL (ref 0.0–0.5)
EOS%: 4.7 % (ref 0.0–7.0)
HCT: 34.6 % — ABNORMAL LOW (ref 34.8–46.6)
HGB: 11.4 g/dL — ABNORMAL LOW (ref 11.6–15.9)
LYMPH%: 7.3 % — ABNORMAL LOW (ref 14.0–49.7)
MCH: 30.8 pg (ref 25.1–34.0)
MCHC: 32.9 g/dL (ref 31.5–36.0)
MCV: 93.5 fL (ref 79.5–101.0)
MONO#: 0.8 10*3/uL (ref 0.1–0.9)
MONO%: 11 % (ref 0.0–14.0)
NEUT%: 76 % (ref 38.4–76.8)
NEUTROS ABS: 5.2 10*3/uL (ref 1.5–6.5)
NRBC: 0 % (ref 0–0)
PLATELETS: 230 10*3/uL (ref 145–400)
RBC: 3.7 10*6/uL (ref 3.70–5.45)
RDW: 16.8 % — AB (ref 11.2–14.5)
WBC: 6.8 10*3/uL (ref 3.9–10.3)
lymph#: 0.5 10*3/uL — ABNORMAL LOW (ref 0.9–3.3)

## 2017-04-12 MED ORDER — SODIUM CHLORIDE 0.9% FLUSH
10.0000 mL | INTRAVENOUS | Status: DC | PRN
  Administered 2017-04-12: 10 mL via INTRAVENOUS
  Filled 2017-04-12: qty 10

## 2017-04-12 MED ORDER — FAMOTIDINE IN NACL 20-0.9 MG/50ML-% IV SOLN
INTRAVENOUS | Status: AC
  Filled 2017-04-12: qty 50

## 2017-04-12 MED ORDER — DIPHENHYDRAMINE HCL 50 MG/ML IJ SOLN
25.0000 mg | Freq: Once | INTRAMUSCULAR | Status: AC
  Administered 2017-04-12: 25 mg via INTRAVENOUS

## 2017-04-12 MED ORDER — SODIUM CHLORIDE 0.9 % IV SOLN
20.0000 mg | Freq: Once | INTRAVENOUS | Status: AC
  Administered 2017-04-12: 20 mg via INTRAVENOUS
  Filled 2017-04-12: qty 2

## 2017-04-12 MED ORDER — SODIUM CHLORIDE 0.9 % IV SOLN
80.0000 mg/m2 | Freq: Once | INTRAVENOUS | Status: AC
  Administered 2017-04-12: 114 mg via INTRAVENOUS
  Filled 2017-04-12: qty 19

## 2017-04-12 MED ORDER — SODIUM CHLORIDE 0.9 % IV SOLN
Freq: Once | INTRAVENOUS | Status: AC
  Administered 2017-04-12: 12:00:00 via INTRAVENOUS

## 2017-04-12 MED ORDER — SODIUM CHLORIDE 0.9% FLUSH
10.0000 mL | INTRAVENOUS | Status: DC | PRN
  Administered 2017-04-12: 10 mL
  Filled 2017-04-12: qty 10

## 2017-04-12 MED ORDER — DIPHENHYDRAMINE HCL 50 MG/ML IJ SOLN
INTRAMUSCULAR | Status: AC
  Filled 2017-04-12: qty 1

## 2017-04-12 MED ORDER — FAMOTIDINE IN NACL 20-0.9 MG/50ML-% IV SOLN
20.0000 mg | Freq: Once | INTRAVENOUS | Status: AC
  Administered 2017-04-12: 20 mg via INTRAVENOUS

## 2017-04-12 MED ORDER — HEPARIN SOD (PORK) LOCK FLUSH 100 UNIT/ML IV SOLN
500.0000 [IU] | Freq: Once | INTRAVENOUS | Status: AC | PRN
  Administered 2017-04-12: 500 [IU]
  Filled 2017-04-12: qty 5

## 2017-04-12 NOTE — Patient Instructions (Signed)
[facility] Discharge Instructions for Patients Receiving Chemotherapy  Today you received the following chemotherapy agent: Taxol  To help prevent nausea and vomiting after your treatment, we encourage you to take your nausea medication as prescribed.   If you develop nausea and vomiting that is not controlled by your nausea medication, call the clinic.   BELOW ARE SYMPTOMS THAT SHOULD BE REPORTED IMMEDIATELY:  *FEVER GREATER THAN 100.5 F  *CHILLS WITH OR WITHOUT FEVER  NAUSEA AND VOMITING THAT IS NOT CONTROLLED WITH YOUR NAUSEA MEDICATION  *UNUSUAL SHORTNESS OF BREATH  *UNUSUAL BRUISING OR BLEEDING  TENDERNESS IN MOUTH AND THROAT WITH OR WITHOUT PRESENCE OF ULCERS  *URINARY PROBLEMS  *BOWEL PROBLEMS  UNUSUAL RASH Items with * indicate a potential emergency and should be followed up as soon as possible.  Feel free to call the clinic should you have any questions or concerns. The clinic phone number is [phone].  Please show the CHEMO ALERT CARD at check-in to the Emergency Department and triage nurse.   

## 2017-04-12 NOTE — Progress Notes (Signed)
Patient Care Team: [name], MD as PCP - General (Family Medicine) Nicholas Lose, MD as Consulting Physician (Hematology and Oncology) [name], MD as Consulting Physician (General Surgery) Gery Pray, MD as Consulting Physician (Radiation Oncology)  DIAGNOSIS:  Encounter Diagnosis  Name Primary?  . Malignant neoplasm of upper-outer quadrant of right breast in female, estrogen receptor positive ([place])     SUMMARY OF ONCOLOGIC HISTORY:   Malignant neoplasm of upper-outer quadrant of right breast in female, estrogen receptor positive ([place])   12/05/2016 Initial Diagnosis    Screening detected calcifications Rt breast spanning 1.5 cm, U/S measured a lesion 1.2 cm size, Biopsy revealed IDC Grade 3, ER/PR Negative, Her 2 Neg Ratio: 1.05; Ki 67: 20%; T1bN0 Stage 1A Clinical stage      12/24/2016 Genetic Testing    The patient had genetic testing due to a personal and family history of breast cancer.  The [facility] Common Hereditary Cancer Panel + Melanoma panel was sent out. The Hereditary Gene Panel + Melanoma Panel offered by [facility] includes sequencing and/or deletion duplication testing of the following 53 genes: APC, ATM, AXIN2, BAP1, BARD1, BMPR1A, BRCA1, BRCA2, BRIP1, CDH1, CDK4, CDKN2A (p14ARF), CDKN2A (p16INK4a), CHEK2, CTNNA1, DICER1, EPCAM (Deletion/duplication testing only), GREM1 (promoter region deletion/duplication testing only), KIT, MC1R, MEN1, MLH1, MSH2, MSH3, MSH6, MUTYH, NBN, NF1, NHTL1, PALB2, PDGFRA, PMS2, POLD1, POLE, POT1, PTEN, RAD50, RAD51C, RAD51D, RB1, SDHB, SDHC, SDHD, SMAD4, SMARCA4. STK11, TERT, TP53, TSC1, TSC2, and VHL.  The following genes were evaluated for sequence changes only: MITF, SDHA and HOXB13 c.251G>A variant only.  Results: No pathogenic mutations identified. 3 VU'sS in BRCA1 c.2967T>A (p.Phe989Leu) , MSH6 c.2857G>A (p.Glu953Lys), and TERT c.902G>A (p.Arg301His) were identified.  The date of this test result is 12/24/2016.        12/28/2016  Surgery    Right lumpectomy: IDC grade 2, 1.2 cm, broadly 0.1 cm to the posterior margin, 0/5 lymph nodes negative, ER 0%, PR 0%, HER-2 negative ratio 1.05, Ki-67 5%, T1 cN0 stage IB AJCC 8      01/25/2017 -  Chemotherapy    Adjuvant chemotherapy with dose dense Adriamycin and Cytoxan 4 followed by Taxol weekly 12        CHIEF COMPLIANT: Cycle 4 Taxol  INTERVAL HISTORY: Lorraine Dean is a 51 year old with above-mentioned history of right breast cancer treated with lumpectomy and is currently on adjuvant chemotherapy today is cycle 4 of Taxol.  Overall she is tolerating Taxol extremely well.  She denies any nausea or vomiting.  Denies any neuropathy.  Nail discoloration was her biggest complaint.  REVIEW OF SYSTEMS:   Constitutional: Denies fevers, chills or abnormal weight loss Eyes: Denies blurriness of vision Ears, nose, mouth, throat, and face: Denies mucositis or sore throat Respiratory: Denies cough, dyspnea or wheezes Cardiovascular: Denies palpitation, chest discomfort Gastrointestinal:  Denies nausea, heartburn or change in bowel habits Skin: Denies abnormal skin rashes Lymphatics: Denies new lymphadenopathy or easy bruising Neurological:Denies numbness, tingling or new weaknesses Behavioral/Psych: Mood is stable, no new changes  Extremities: No lower extremity edema  All other systems were reviewed with the patient and are negative.  I have reviewed the past medical history, past surgical history, social history and family history with the patient and they are unchanged from previous note.  ALLERGIES:  has No Known Allergies.  MEDICATIONS:  Current Outpatient Medications  Medication Sig Dispense Refill  . acetaminophen (TYLENOL) 500 MG tablet Take 500 mg by mouth every 6 (six) hours as needed.    Marland Kitchen  albuterol (PROVENTIL HFA;VENTOLIN HFA) 108 (90 Base) MCG/ACT inhaler Inhale 1-2 puffs into the lungs every 4 (four) hours as needed for wheezing or shortness of breath.     Marland Kitchen aspirin EC 81 MG tablet Take 81 mg by mouth daily.    . Biotin 1000 MCG tablet Take 1,000 mcg by mouth 3 (three) times daily.    . calcium-vitamin D (OSCAL WITH D) 500-200 MG-UNIT tablet Take 1 tablet by mouth.    . dexamethasone (DECADRON) 4 MG tablet Take 1 tablet (4 mg total) by mouth daily. Take 1 tablet day after chemotherapy and one more tablet the following day with food 8 tablet 0  . fluticasone (FLONASE) 50 MCG/ACT nasal spray Place 1 spray into both nostrils as needed for allergies or rhinitis.    [name] Oil 1000 MG CAPS Take by mouth.    . lidocaine-prilocaine (EMLA) cream Apply to affected area once 30 g 3  . LORazepam (ATIVAN) 0.5 MG tablet Take 1 tablet (0.5 mg total) by mouth at bedtime. 30 tablet 0  . Multiple Vitamins-Minerals (OCUVITE ADULT FORMULA) CAPS Take by mouth.    . ondansetron (ZOFRAN) 8 MG tablet Take 1 tablet (8 mg total) by mouth 2 (two) times daily as needed. Start on the third day after chemotherapy. 30 tablet 1  . prochlorperazine (COMPAZINE) 10 MG tablet Take 1 tablet (10 mg total) by mouth every 6 (six) hours as needed (Nausea or vomiting). 30 tablet 1   No current facility-administered medications for this visit.     PHYSICAL EXAMINATION: ECOG PERFORMANCE STATUS: 1 - Symptomatic but completely ambulatory  Vitals:   04/12/17 1030  BP: (!) 106/58  Pulse: 97  Resp: 18  Temp: 98.5 F (36.9 C)  SpO2: 99%   Filed Weights   04/12/17 1030  Weight: 103 lb 6.4 oz (46.9 kg)    GENERAL:alert, no distress and comfortable SKIN: skin color, texture, turgor are normal, no rashes or significant lesions EYES: normal, Conjunctiva are pink and non-injected, sclera clear OROPHARYNX:no exudate, no erythema and lips, buccal mucosa, and tongue normal  NECK: supple, thyroid normal size, non-tender, without nodularity LYMPH:  no palpable lymphadenopathy in the cervical, axillary or inguinal LUNGS: clear to auscultation and percussion with normal breathing  effort HEART: regular rate & rhythm and no murmurs and no lower extremity edema ABDOMEN:abdomen soft, non-tender and normal bowel sounds MUSCULOSKELETAL:no cyanosis of digits and no clubbing  NEURO: alert & oriented x 3 with fluent speech, no focal motor/sensory deficits EXTREMITIES: No lower extremity edema  LABORATORY DATA:  I have reviewed the data as listed   Chemistry      Component Value Date/Time   NA 142 04/12/2017 0929   K 3.3 (L) 04/12/2017 0929   CO2 22 04/12/2017 0929   BUN 7.2 04/12/2017 0929   CREATININE 0.7 04/12/2017 0929      Component Value Date/Time   CALCIUM 9.3 04/12/2017 0929   ALKPHOS 75 04/12/2017 0929   AST 30 04/12/2017 0929   ALT 65 (H) 04/12/2017 0929   BILITOT 0.29 04/12/2017 0929       Lab Results  Component Value Date   WBC 6.8 04/12/2017   HGB 11.4 (L) 04/12/2017   HCT 34.6 (L) 04/12/2017   MCV 93.5 04/12/2017   PLT 230 04/12/2017   NEUTROABS 5.2 04/12/2017    ASSESSMENT & PLAN:  Malignant neoplasm of upper-outer quadrant of right breast in female, estrogen receptor positive ([place]) 12/28/2016: Right lumpectomy: IDC grade 2, 1.2 cm, broadly 0.1  cm to the posterior margin, 0/5 lymph nodes negative, ER 0%, PR 0%, HER-2 negative ratio 1.05, Ki-67 5%, T1 cN0 stage IB AJCC 8  Recommendation: 1. Adjuvant chemotherapy with dose dense Adriamycin and Cytoxan every 2 weeks 4 followed by weekly Taxol 12 2. followed by adjuvant radiation therapy ---------------------------------------------------------------------- Current treatment: Week  4 taxol  Chemotherapy toxicities: 1. Nail dyscrasia: This can happen with Taxol, no neuropathy noted.  Patient to moisturize and keep nails trimmed.   She does not have any neuropathy symptoms. Does not have any nausea vomiting. Slight hypokalemia: I discussed with her to increase potassium in her diet.  Return to clinic weekly for Taxol and every 2 weeks for follow-up with [place]  I spent 25 minutes talking  to the patient of which more than half was spent in counseling and coordination of care.  No orders of the defined types were placed in this encounter.  The patient has a good understanding of the overall plan. she agrees with it. she will call with any problems that may develop before the next visit here.   Rulon Eisenmenger, MD 04/12/17

## 2017-04-12 NOTE — Assessment & Plan Note (Signed)
12/28/2016: Right lumpectomy: IDC grade 2, 1.2 cm, broadly 0.1 cm to the posterior margin, 0/5 lymph nodes negative, ER 0%, PR 0%, HER-2 negative ratio 1.05, Ki-67 5%, T1 cN0 stage IB AJCC 8  Recommendation: 1. Adjuvant chemotherapy with dose dense Adriamycin and Cytoxan every 2 weeks 4 followed by weekly Taxol 12 2. followed by adjuvant radiation therapy ---------------------------------------------------------------------- Current treatment: Week 4 taxol  Chemotherapy toxicities: 1. Nail dyscrasia: This can happen with Taxol, no neuropathy noted.  Patient to moisturize and keep nails trimmed.    Return to clinic weekly for Taxol and every 2 weeks for follow-up with [place]

## 2017-04-19 ENCOUNTER — Ambulatory Visit (HOSPITAL_BASED_OUTPATIENT_CLINIC_OR_DEPARTMENT_OTHER): Payer: BLUE CROSS/BLUE SHIELD

## 2017-04-19 ENCOUNTER — Ambulatory Visit: Payer: BLUE CROSS/BLUE SHIELD

## 2017-04-19 ENCOUNTER — Other Ambulatory Visit (HOSPITAL_BASED_OUTPATIENT_CLINIC_OR_DEPARTMENT_OTHER): Payer: BLUE CROSS/BLUE SHIELD

## 2017-04-19 VITALS — BP 123/43 | HR 90 | Temp 99.5°F | Resp 17

## 2017-04-19 DIAGNOSIS — Z170 Estrogen receptor positive status [ER+]: Principal | ICD-10-CM

## 2017-04-19 DIAGNOSIS — Z5111 Encounter for antineoplastic chemotherapy: Secondary | ICD-10-CM

## 2017-04-19 DIAGNOSIS — C50411 Malignant neoplasm of upper-outer quadrant of right female breast: Secondary | ICD-10-CM | POA: Diagnosis not present

## 2017-04-19 LAB — COMPREHENSIVE METABOLIC PANEL
ALT: 36 U/L (ref 0–55)
AST: 23 U/L (ref 5–34)
Albumin: 3.6 g/dL (ref 3.5–5.0)
Alkaline Phosphatase: 73 U/L (ref 40–150)
Anion Gap: 10 mEq/L (ref 3–11)
BUN: 7.7 mg/dL (ref 7.0–26.0)
CALCIUM: 9 mg/dL (ref 8.4–10.4)
CHLORIDE: 106 meq/L (ref 98–109)
CO2: 23 meq/L (ref 22–29)
Creatinine: 0.6 mg/dL (ref 0.6–1.1)
EGFR: >60 mL/min/{1.73_m2} (ref >60)
Glucose: 94 mg/dl (ref 70–140)
POTASSIUM: 3.4 meq/L — AB (ref 3.5–5.1)
Sodium: 139 mEq/L (ref 136–145)
Total Bilirubin: 0.4 mg/dL (ref 0.20–1.20)
Total Protein: 6.6 g/dL (ref 6.4–8.3)

## 2017-04-19 LAB — CBC WITH DIFFERENTIAL/PLATELET
BASO%: 1.2 % (ref 0.0–2.0)
BASOS ABS: 0.1 10*3/uL (ref 0.0–0.1)
EOS ABS: 0.6 10*3/uL — AB (ref 0.0–0.5)
EOS%: 8 % — ABNORMAL HIGH (ref 0.0–7.0)
HCT: 33.3 % — ABNORMAL LOW (ref 34.8–46.6)
HEMOGLOBIN: 10.8 g/dL — AB (ref 11.6–15.9)
LYMPH%: 7.7 % — AB (ref 14.0–49.7)
MCH: 30.7 pg (ref 25.1–34.0)
MCHC: 32.4 g/dL (ref 31.5–36.0)
MCV: 94.6 fL (ref 79.5–101.0)
MONO#: 0.8 10*3/uL (ref 0.1–0.9)
MONO%: 10.8 % (ref 0.0–14.0)
NEUT%: 72.3 % (ref 38.4–76.8)
NEUTROS ABS: 5.2 10*3/uL (ref 1.5–6.5)
PLATELETS: 256 10*3/uL (ref 145–400)
RBC: 3.52 10*6/uL — ABNORMAL LOW (ref 3.70–5.45)
RDW: 16.1 % — AB (ref 11.2–14.5)
WBC: 7.2 10*3/uL (ref 3.9–10.3)
lymph#: 0.6 10*3/uL — ABNORMAL LOW (ref 0.9–3.3)

## 2017-04-19 MED ORDER — SODIUM CHLORIDE 0.9 % IV SOLN
80.0000 mg/m2 | Freq: Once | INTRAVENOUS | Status: AC
  Administered 2017-04-19: 114 mg via INTRAVENOUS
  Filled 2017-04-19: qty 19

## 2017-04-19 MED ORDER — DIPHENHYDRAMINE HCL 50 MG/ML IJ SOLN
25.0000 mg | Freq: Once | INTRAMUSCULAR | Status: AC
  Administered 2017-04-19: 25 mg via INTRAVENOUS

## 2017-04-19 MED ORDER — SODIUM CHLORIDE 0.9 % IV SOLN
20.0000 mg | Freq: Once | INTRAVENOUS | Status: AC
  Administered 2017-04-19: 20 mg via INTRAVENOUS
  Filled 2017-04-19: qty 2

## 2017-04-19 MED ORDER — SODIUM CHLORIDE 0.9% FLUSH
10.0000 mL | INTRAVENOUS | Status: DC | PRN
  Administered 2017-04-19: 10 mL
  Filled 2017-04-19: qty 10

## 2017-04-19 MED ORDER — DIPHENHYDRAMINE HCL 50 MG/ML IJ SOLN
INTRAMUSCULAR | Status: AC
  Filled 2017-04-19: qty 1

## 2017-04-19 MED ORDER — HEPARIN SOD (PORK) LOCK FLUSH 100 UNIT/ML IV SOLN
500.0000 [IU] | Freq: Once | INTRAVENOUS | Status: AC | PRN
  Administered 2017-04-19: 500 [IU]
  Filled 2017-04-19: qty 5

## 2017-04-19 MED ORDER — FAMOTIDINE IN NACL 20-0.9 MG/50ML-% IV SOLN
20.0000 mg | Freq: Once | INTRAVENOUS | Status: AC
  Administered 2017-04-19: 20 mg via INTRAVENOUS

## 2017-04-19 MED ORDER — FAMOTIDINE IN NACL 20-0.9 MG/50ML-% IV SOLN
INTRAVENOUS | Status: AC
  Filled 2017-04-19: qty 50

## 2017-04-19 MED ORDER — SODIUM CHLORIDE 0.9 % IV SOLN
Freq: Once | INTRAVENOUS | Status: AC
  Administered 2017-04-19: 09:00:00 via INTRAVENOUS

## 2017-04-19 NOTE — Patient Instructions (Signed)
[facility] Discharge Instructions for Patients Receiving Chemotherapy  Today you received the following chemotherapy agents :  Taxol.  To help prevent nausea and vomiting after your treatment, we encourage you to take your nausea medication as prescribed.   If you develop nausea and vomiting that is not controlled by your nausea medication, call the clinic.   BELOW ARE SYMPTOMS THAT SHOULD BE REPORTED IMMEDIATELY:  *FEVER GREATER THAN 100.5 F  *CHILLS WITH OR WITHOUT FEVER  NAUSEA AND VOMITING THAT IS NOT CONTROLLED WITH YOUR NAUSEA MEDICATION  *UNUSUAL SHORTNESS OF BREATH  *UNUSUAL BRUISING OR BLEEDING  TENDERNESS IN MOUTH AND THROAT WITH OR WITHOUT PRESENCE OF ULCERS  *URINARY PROBLEMS  *BOWEL PROBLEMS  UNUSUAL RASH Items with * indicate a potential emergency and should be followed up as soon as possible.  Feel free to call the clinic should you have any questions or concerns. The clinic phone number is [phone].  Please show the CHEMO ALERT CARD at check-in to the Emergency Department and triage nurse.   

## 2017-04-22 ENCOUNTER — Other Ambulatory Visit: Payer: Self-pay | Admitting: Family Medicine

## 2017-04-22 ENCOUNTER — Ambulatory Visit
Admission: RE | Admit: 2017-04-22 | Discharge: 2017-04-22 | Disposition: A | Discharge status: Home / Self Care | Payer: BLUE CROSS/BLUE SHIELD | Source: Ambulatory Visit | Attending: Family Medicine | Admitting: Family Medicine

## 2017-04-22 DIAGNOSIS — R05 Cough: Secondary | ICD-10-CM

## 2017-04-22 DIAGNOSIS — R059 Cough, unspecified: Secondary | ICD-10-CM

## 2017-04-23 ENCOUNTER — Telehealth: Payer: Self-pay

## 2017-04-23 ENCOUNTER — Encounter: Payer: Self-pay | Admitting: Hematology and Oncology

## 2017-04-23 NOTE — Telephone Encounter (Signed)
Called to inform us she ran a fever over the weekend. Her PCP put her on antibiotics. She questioned if she should go to work. I explained to her do not go to work until she has been fever free for 24 hours. She also questioned if this would effect treatment on Friday. Explained to her we would check her labs and as long as they were good and she felt well she would continue with treatment on Friday.  Cyndia Bent RN

## 2017-04-23 NOTE — Progress Notes (Signed)
Called patient to introduce myself as Arboriculturist and to ask if she had any financial questions or concerns. Patient states she is ok and her job is taking care of her. Discussed [employer] with patient and she states right now she is ok. Discussed [employer] First Step program for [place] being that her insurance renews in January, patient states she is no longer taking and has no concerns. Patient was very appreciative of my call.

## 2017-04-25 ENCOUNTER — Other Ambulatory Visit: Payer: Self-pay | Admitting: *Deleted

## 2017-04-26 ENCOUNTER — Ambulatory Visit: Payer: BLUE CROSS/BLUE SHIELD

## 2017-04-26 ENCOUNTER — Ambulatory Visit (HOSPITAL_COMMUNITY)
Admission: RE | Admit: 2017-04-26 | Discharge: 2017-04-26 | Disposition: A | Discharge status: Home / Self Care | Payer: BLUE CROSS/BLUE SHIELD | Source: Ambulatory Visit | Attending: Adult Health | Admitting: Adult Health

## 2017-04-26 ENCOUNTER — Ambulatory Visit (HOSPITAL_BASED_OUTPATIENT_CLINIC_OR_DEPARTMENT_OTHER): Payer: BLUE CROSS/BLUE SHIELD | Admitting: Adult Health

## 2017-04-26 ENCOUNTER — Encounter: Payer: Self-pay | Admitting: Adult Health

## 2017-04-26 ENCOUNTER — Telehealth: Payer: Self-pay | Admitting: Adult Health

## 2017-04-26 ENCOUNTER — Other Ambulatory Visit (HOSPITAL_BASED_OUTPATIENT_CLINIC_OR_DEPARTMENT_OTHER): Payer: BLUE CROSS/BLUE SHIELD

## 2017-04-26 VITALS — BP 118/50 | HR 96 | Temp 98.8°F | Resp 18 | Ht 60.0 in | Wt 103.6 lb

## 2017-04-26 DIAGNOSIS — Z170 Estrogen receptor positive status [ER+]: Principal | ICD-10-CM

## 2017-04-26 DIAGNOSIS — R509 Fever, unspecified: Secondary | ICD-10-CM | POA: Insufficient documentation

## 2017-04-26 DIAGNOSIS — C50919 Malignant neoplasm of unspecified site of unspecified female breast: Secondary | ICD-10-CM | POA: Insufficient documentation

## 2017-04-26 DIAGNOSIS — C50411 Malignant neoplasm of upper-outer quadrant of right female breast: Secondary | ICD-10-CM

## 2017-04-26 DIAGNOSIS — J069 Acute upper respiratory infection, unspecified: Secondary | ICD-10-CM | POA: Diagnosis not present

## 2017-04-26 DIAGNOSIS — Z95828 Presence of other vascular implants and grafts: Secondary | ICD-10-CM

## 2017-04-26 DIAGNOSIS — R05 Cough: Secondary | ICD-10-CM | POA: Diagnosis not present

## 2017-04-26 DIAGNOSIS — E876 Hypokalemia: Secondary | ICD-10-CM

## 2017-04-26 LAB — COMPREHENSIVE METABOLIC PANEL
ALBUMIN: 3.2 g/dL — AB (ref 3.5–5.0)
ALK PHOS: 76 U/L (ref 40–150)
ALT: 35 U/L (ref 0–55)
ANION GAP: 10 meq/L (ref 3–11)
AST: 29 U/L (ref 5–34)
BILIRUBIN TOTAL: 0.23 mg/dL (ref 0.20–1.20)
BUN: 7.3 mg/dL (ref 7.0–26.0)
CALCIUM: 8.7 mg/dL (ref 8.4–10.4)
CO2: 22 meq/L (ref 22–29)
CREATININE: 0.6 mg/dL (ref 0.6–1.1)
Chloride: 108 mEq/L (ref 98–109)
Glucose: 101 mg/dl (ref 70–140)
Potassium: 2.9 mEq/L — CL (ref 3.5–5.1)
Sodium: 140 mEq/L (ref 136–145)
TOTAL PROTEIN: 6.3 g/dL — AB (ref 6.4–8.3)

## 2017-04-26 LAB — CBC WITH DIFFERENTIAL/PLATELET
BASO%: 1.3 % (ref 0.0–2.0)
BASOS ABS: 0.1 10*3/uL (ref 0.0–0.1)
EOS%: 8.5 % — AB (ref 0.0–7.0)
Eosinophils Absolute: 0.5 10*3/uL (ref 0.0–0.5)
HCT: 32.3 % — ABNORMAL LOW (ref 34.8–46.6)
HGB: 10.7 g/dL — ABNORMAL LOW (ref 11.6–15.9)
LYMPH%: 8.7 % — ABNORMAL LOW (ref 14.0–49.7)
MCH: 31.3 pg (ref 25.1–34.0)
MCHC: 33.1 g/dL (ref 31.5–36.0)
MCV: 94.4 fL (ref 79.5–101.0)
MONO#: 0.5 10*3/uL (ref 0.1–0.9)
MONO%: 7.4 % (ref 0.0–14.0)
NEUT#: 4.5 10*3/uL (ref 1.5–6.5)
NEUT%: 74.1 % (ref 38.4–76.8)
NRBC: 0 % (ref 0–0)
Platelets: 269 10*3/uL (ref 145–400)
RBC: 3.42 10*6/uL — AB (ref 3.70–5.45)
RDW: 15.6 % — AB (ref 11.2–14.5)
WBC: 6.1 10*3/uL (ref 3.9–10.3)
lymph#: 0.5 10*3/uL — ABNORMAL LOW (ref 0.9–3.3)

## 2017-04-26 MED ORDER — SODIUM CHLORIDE 0.9% FLUSH
10.0000 mL | INTRAVENOUS | Status: DC | PRN
  Administered 2017-04-26: 10 mL via INTRAVENOUS
  Filled 2017-04-26: qty 10

## 2017-04-26 MED ORDER — POTASSIUM CHLORIDE ER 10 MEQ PO TBCR: 20.0000 meq | EXTENDED_RELEASE_TABLET | Freq: Two times a day (BID) | ORAL | 0 refills | Status: DC

## 2017-04-26 NOTE — Telephone Encounter (Signed)
Gave patient avs and calendar with appts per 12/20 los.  °

## 2017-04-26 NOTE — Assessment & Plan Note (Signed)
12/28/2016: Right lumpectomy: IDC grade 2, 1.2 cm, broadly 0.1 cm to the posterior margin, 0/5 lymph nodes negative, ER 0%, PR 0%, HER-2 negative ratio 1.05, Ki-67 5%, T1 cN0 stage IB AJCC 8  Recommendation: 1. Adjuvant chemotherapy with dose dense Adriamycin and Cytoxan every 2 weeks 4 followed by weekly Taxol 12 2. followed by adjuvant radiation therapy ---------------------------------------------------------------------- Current treatment: Week 6 taxol  Lorraine Dean is feeling unwell today.  Since she had a temp of 101.5 last night and has continued to have this URI, she cannot receive chemotherapy today.  We will do FFWU with blood cultures, urinalysis and chest xray.  We will wait on those results before we decide on changing or adding any antibiotics.    This plan was reviewed with Dr. Jana Hakim in detail.

## 2017-04-26 NOTE — Progress Notes (Signed)
[facility] Follow up:    Lorraine Pepper, MD [address] [id number]   DIAGNOSIS: Cancer Staging Malignant neoplasm of upper-outer quadrant of right breast in female, estrogen receptor positive ([place]) Staging form: Breast, AJCC 8th Edition - Clinical stage from 12/12/2016: Stage IB (cT1c, cN0, cM0, G3, ER: Negative, PR: Negative, HER2: Negative) - Unsigned   SUMMARY OF ONCOLOGIC HISTORY:   Malignant neoplasm of upper-outer quadrant of right breast in female, estrogen receptor positive ([place])   12/05/2016 Initial Diagnosis    Screening detected calcifications Rt breast spanning 1.5 cm, U/S measured a lesion 1.2 cm size, Biopsy revealed IDC Grade 3, ER/PR Negative, Her 2 Neg Ratio: 1.05; Ki 67: 20%; T1bN0 Stage 1A Clinical stage      12/24/2016 Genetic Testing    The patient had genetic testing due to a personal and family history of breast cancer.  The [facility] Common Hereditary Cancer Panel + Melanoma panel was sent out. The Hereditary Gene Panel + Melanoma Panel offered by [facility] includes sequencing and/or deletion duplication testing of the following 53 genes: APC, ATM, AXIN2, BAP1, BARD1, BMPR1A, BRCA1, BRCA2, BRIP1, CDH1, CDK4, CDKN2A (p14ARF), CDKN2A (p16INK4a), CHEK2, CTNNA1, DICER1, EPCAM (Deletion/duplication testing only), GREM1 (promoter region deletion/duplication testing only), KIT, MC1R, MEN1, MLH1, MSH2, MSH3, MSH6, MUTYH, NBN, NF1, NHTL1, PALB2, PDGFRA, PMS2, POLD1, POLE, POT1, PTEN, RAD50, RAD51C, RAD51D, RB1, SDHB, SDHC, SDHD, SMAD4, SMARCA4. STK11, TERT, TP53, TSC1, TSC2, and VHL.  The following genes were evaluated for sequence changes only: MITF, SDHA and HOXB13 c.251G>A variant only.  Results: No pathogenic mutations identified. 3 VU'sS in BRCA1 c.2967T>A (p.Phe989Leu) , MSH6 c.2857G>A (p.Glu953Lys), and TERT c.902G>A (p.Arg301His) were identified.  The date of this test result is 12/24/2016.        12/28/2016 Surgery    Right  lumpectomy: IDC grade 2, 1.2 cm, broadly 0.1 cm to the posterior margin, 0/5 lymph nodes negative, ER 0%, PR 0%, HER-2 negative ratio 1.05, Ki-67 5%, T1 cN0 stage IB AJCC 8      01/25/2017 -  Chemotherapy    Adjuvant chemotherapy with dose dense Adriamycin and Cytoxan 4 followed by Taxol weekly 12        CURRENT THERAPY: Taxol week 6  INTERVAL HISTORY: Lorraine Dean 51 y.o. female returns for evaluation prior to receiving chemotherapy.  She has been running a fever of 101.5 since last weekend.  She went to her PCP and underwent a chest xray that was normal.  She was started on a Zpack.  She has continued to have fevers with her most recent temp being 101.5 last night.  She continues to cough and feel poorly.  She is day 3 of antibiotics with a Zpack.     Patient Active Problem List   Diagnosis Date Noted  . Port catheter in place 01/25/2017  . Preoperative clearance 12/24/2016  . Palpitations 12/24/2016  . Hyperlipidemia 12/24/2016  . Family history of early CAD 12/24/2016  . Genetic testing 12/20/2016  . Family history of breast cancer 12/13/2016  . Family history of lung cancer 12/13/2016  . Malignant neoplasm of upper-outer quadrant of right breast in female, estrogen receptor positive ([place]) 12/11/2016    has No Known Allergies.  MEDICAL HISTORY: Past Medical History:  Diagnosis Date  . Anxiety   . Cancer [facility])    right breast cancer  . Chronic rhinitis   . Depression   . Family history of breast cancer 12/13/2016  . Family history of  lung cancer 12/13/2016  . High cholesterol   . HPV in female   . PONV (postoperative nausea and vomiting)     SURGICAL HISTORY: Past Surgical History:  Procedure Laterality Date  . BREAST BIOPSY Right 2018   triple negative IDC grade 3 tumor  . BREAST LUMPECTOMY WITH RADIOACTIVE SEED AND SENTINEL LYMPH NODE BIOPSY Right 12/28/2016   Procedure: RIGHT BREAST LUMPECTOMY WITH RADIOACTIVE SEED AND SENTINEL LYMPH NODE MAPPING ERAS  PATHWAY;  Surgeon: Lorraine Kussmaul, MD;  Location: [place];  Service: General;  Laterality: Right;  PEC BLOCK  . PORTACATH PLACEMENT Left 01/18/2017   Procedure: INSERTION PORT-A-CATH;  Surgeon: Lorraine Kussmaul, MD;  Location: [place];  Service: General;  Laterality: Left;  . WISDOM TOOTH EXTRACTION      SOCIAL HISTORY: Social History   Socioeconomic History  . Marital status: Single    Spouse name: Not on file  . Number of children: Not on file  . Years of education: Not on file  . Highest education level: Not on file  Social Needs  . Financial resource strain: Not on file  . Food insecurity - worry: Not on file  . Food insecurity - inability: Not on file  . Transportation needs - medical: Not on file  . Transportation needs - non-medical: Not on file  Occupational History  . Not on file  Tobacco Use  . Smoking status: Never Smoker  . Smokeless tobacco: Never Used  Substance and Sexual Activity  . Alcohol use: Yes    Comment: social  . Drug use: No  . Sexual activity: Yes    Birth control/protection: None    Comment: Intercourse 1 month ago was on the pill,then stoppedl  Other Topics Concern  . Not on file  Social History Narrative  . Not on file    FAMILY HISTORY: Family History  Problem Relation Age of Onset  . Heart disease Father 13       CABG  . Breast cancer Maternal Aunt 60  . Lung cancer Maternal Aunt        60's. unsure if it was a primary LG or a met from breast recurrence.  She had a brain met as well.  She died at 15.  . Lung cancer Paternal Aunt        dx. late 46's, died in 74's.  Marland Kitchen Heart disease Paternal Grandmother   . Heart disease Paternal Grandfather     Review of Systems  Constitutional: Positive for fatigue and fever. Negative for appetite change, chills and unexpected weight change.  HENT:   Negative for hearing loss and lump/mass.   Eyes: Negative for eye problems and icterus.  Respiratory: Positive for  cough. Negative for chest tightness, shortness of breath and wheezing.   Cardiovascular: Negative for chest pain, leg swelling and palpitations.  Gastrointestinal: Negative for abdominal distention, abdominal pain, constipation, diarrhea, nausea and vomiting.  Endocrine: Negative for hot flashes.  Musculoskeletal: Negative for arthralgias.  Skin: Negative for itching and rash.  Neurological: Negative for dizziness, headaches and numbness.  Hematological: Negative for adenopathy. Does not bruise/bleed easily.  Psychiatric/Behavioral: Negative for depression. The patient is not nervous/anxious.       PHYSICAL EXAMINATION  ECOG PERFORMANCE STATUS: 2 - Symptomatic, <50% confined to bed  Vitals:   04/26/17 0849  BP: (!) 118/50  Pulse: 96  Resp: 18  Temp: 98.8 F (37.1 C)  SpO2: 98%    Physical Exam  Constitutional: She is  oriented to person, place, and time and well-developed, well-nourished, and in no distress.  HENT:  Head: Normocephalic and atraumatic.  Right Ear: External ear normal.  Left Ear: External ear normal.  Nose: Nose normal.  Mouth/Throat: Oropharynx is clear and moist. No oropharyngeal exudate.  Eyes: Pupils are equal, round, and reactive to light. No scleral icterus.  Neck: Neck supple.  Cardiovascular: Normal rate, regular rhythm and normal heart sounds.  Pulmonary/Chest: Effort normal and breath sounds normal. No respiratory distress. She has no wheezes. She has no rales.  Abdominal: Soft. Bowel sounds are normal. She exhibits no distension. There is no tenderness.  Musculoskeletal: She exhibits no edema.  Lymphadenopathy:    She has no cervical adenopathy.  Neurological: She is alert and oriented to person, place, and time.  Skin: Skin is warm and dry. No rash noted. No erythema.  Psychiatric: Mood and affect normal.    LABORATORY DATA:  CBC    Component Value Date/Time   WBC 6.1 04/26/2017 0754   RBC 3.42 (L) 04/26/2017 0754   HGB 10.7 (L)  04/26/2017 0754   HCT 32.3 (L) 04/26/2017 0754   PLT 269 04/26/2017 0754   MCV 94.4 04/26/2017 0754   MCH 31.3 04/26/2017 0754   MCHC 33.1 04/26/2017 0754   RDW 15.6 (H) 04/26/2017 0754   LYMPHSABS 0.5 (L) 04/26/2017 0754   MONOABS 0.5 04/26/2017 0754   EOSABS 0.5 04/26/2017 0754   BASOSABS 0.1 04/26/2017 0754    CMP     Component Value Date/Time   NA 140 04/26/2017 0754   K 2.9 (LL) 04/26/2017 0754   CO2 22 04/26/2017 0754   GLUCOSE 101 04/26/2017 0754   BUN 7.3 04/26/2017 0754   CREATININE 0.6 04/26/2017 0754   CALCIUM 8.7 04/26/2017 0754   PROT 6.3 (L) 04/26/2017 0754   ALBUMIN 3.2 (L) 04/26/2017 0754   AST 29 04/26/2017 0754   ALT 35 04/26/2017 0754   ALKPHOS 76 04/26/2017 0754   BILITOT 0.23 04/26/2017 0754        ASSESSMENT and PLAN:   Malignant neoplasm of upper-outer quadrant of right breast in female, estrogen receptor positive ([place]) 12/28/2016: Right lumpectomy: IDC grade 2, 1.2 cm, broadly 0.1 cm to the posterior margin, 0/5 lymph nodes negative, ER 0%, PR 0%, HER-2 negative ratio 1.05, Ki-67 5%, T1 cN0 stage IB AJCC 8  Recommendation: 1. Adjuvant chemotherapy with dose dense Adriamycin and Cytoxan every 2 weeks 4 followed by weekly Taxol 12 2. followed by adjuvant radiation therapy ---------------------------------------------------------------------- Current treatment: Week  6 taxol  Shannin is feeling unwell today.  Since she had a temp of 101.5 last night and has continued to have this URI, she cannot receive chemotherapy today.  We will do FFWU with blood cultures, urinalysis and chest xray.  We will wait on those results before we decide on changing or adding any antibiotics.    This plan was reviewed with Dr. Jana Hakim in detail.        Orders Placed This Encounter  Procedures  . Culture, Blood    Standing Status:   Future    Number of Occurrences:   1    Standing Expiration Date:   04/26/2018  . Culture, Blood    Standing Status:    Future    Number of Occurrences:   1    Standing Expiration Date:   04/26/2018  . DG Chest 2 View    Standing Status:   Future    Standing Expiration Date:  04/26/2018    Order Specific Question:   Reason for Exam (SYMPTOM  OR DIAGNOSIS REQUIRED)    Answer:   fever, breast cancer, cough    Order Specific Question:   Is patient pregnant?    Answer:   No    Order Specific Question:   Preferred imaging location?    Answer:   [facility]    Order Specific Question:   Radiology Contrast Protocol - do NOT remove file path    Answer:   [email or web address]  . Urinalysis with microscopic    Standing Status:   Future    Standing Expiration Date:   04/26/2018    All questions were answered. The patient knows to call the clinic with any problems, questions or concerns. We can certainly see the patient much sooner if necessary.  A total of (30) minutes of face-to-face time was spent with this patient with greater than 50% of that time in counseling and care-coordination.  This note was electronically signed. Scot Dock, NP 04/26/2017

## 2017-04-29 ENCOUNTER — Other Ambulatory Visit: Payer: Self-pay

## 2017-04-29 MED ORDER — LEVOFLOXACIN 500 MG PO TABS: 500.0000 mg | ORAL_TABLET | Freq: Every day | ORAL | 0 refills | Status: AC

## 2017-04-29 NOTE — Telephone Encounter (Signed)
Returned pt call regarding continued low grade fevers. She does not have fevers during the day but in the evening they return and have been around 100-100.4. Mendel Ryder, NP is switching her antibiotic to levaquin 500mg  daily x10days. Sent script into patients pharmacy. Patient is aware. She is to call us on Wednesday to let us know how she if feeling.  Cyndia Bent RN

## 2017-05-02 LAB — CULTURE, BLOOD (SINGLE)

## 2017-05-02 NOTE — Assessment & Plan Note (Signed)
12/28/2016: Right lumpectomy: IDC grade 2, 1.2 cm, broadly 0.1 cm to the posterior margin, 0/5 lymph nodes negative, ER 0%, PR 0%, HER-2 negative ratio 1.05, Ki-67 5%, T1 cN0 stage IB AJCC 8  Recommendation: 1. Adjuvant chemotherapy with dose dense Adriamycin and Cytoxan every 2 weeks 4 followed by weekly Taxol 12 2. followed by adjuvant radiation therapy ---------------------------------------------------------------------- Current treatment: Week 6 taxol Toxicities:  RTC weekly for chemo and every 2 weeks for follow up

## 2017-05-03 ENCOUNTER — Ambulatory Visit (HOSPITAL_BASED_OUTPATIENT_CLINIC_OR_DEPARTMENT_OTHER): Payer: BLUE CROSS/BLUE SHIELD

## 2017-05-03 ENCOUNTER — Other Ambulatory Visit (HOSPITAL_BASED_OUTPATIENT_CLINIC_OR_DEPARTMENT_OTHER): Payer: BLUE CROSS/BLUE SHIELD

## 2017-05-03 ENCOUNTER — Ambulatory Visit (HOSPITAL_BASED_OUTPATIENT_CLINIC_OR_DEPARTMENT_OTHER): Payer: BLUE CROSS/BLUE SHIELD | Admitting: Hematology and Oncology

## 2017-05-03 ENCOUNTER — Ambulatory Visit: Payer: BLUE CROSS/BLUE SHIELD

## 2017-05-03 ENCOUNTER — Telehealth: Payer: Self-pay | Admitting: Hematology and Oncology

## 2017-05-03 DIAGNOSIS — Z170 Estrogen receptor positive status [ER+]: Principal | ICD-10-CM

## 2017-05-03 DIAGNOSIS — R509 Fever, unspecified: Secondary | ICD-10-CM | POA: Diagnosis not present

## 2017-05-03 DIAGNOSIS — Z5111 Encounter for antineoplastic chemotherapy: Secondary | ICD-10-CM | POA: Diagnosis not present

## 2017-05-03 DIAGNOSIS — C50411 Malignant neoplasm of upper-outer quadrant of right female breast: Secondary | ICD-10-CM

## 2017-05-03 DIAGNOSIS — Z95828 Presence of other vascular implants and grafts: Secondary | ICD-10-CM

## 2017-05-03 LAB — COMPREHENSIVE METABOLIC PANEL
ALT: 49 U/L (ref 0–55)
ANION GAP: 12 meq/L — AB (ref 3–11)
AST: 40 U/L — ABNORMAL HIGH (ref 5–34)
Albumin: 3.8 g/dL (ref 3.5–5.0)
Alkaline Phosphatase: 128 U/L (ref 40–150)
BUN: 14.5 mg/dL (ref 7.0–26.0)
CHLORIDE: 104 meq/L (ref 98–109)
CO2: 22 meq/L (ref 22–29)
CREATININE: 0.8 mg/dL (ref 0.6–1.1)
Calcium: 10 mg/dL (ref 8.4–10.4)
Glucose: 100 mg/dl (ref 70–140)
POTASSIUM: 4 meq/L (ref 3.5–5.1)
Sodium: 138 mEq/L (ref 136–145)
Total Bilirubin: 0.33 mg/dL (ref 0.20–1.20)
Total Protein: 7.3 g/dL (ref 6.4–8.3)

## 2017-05-03 LAB — URINALYSIS, MICROSCOPIC - CHCC
BILIRUBIN (URINE): NEGATIVE
BLOOD: NEGATIVE
Glucose: NEGATIVE mg/dL
Ketones: NEGATIVE mg/dL
Nitrite: NEGATIVE
Protein: NEGATIVE mg/dL
SPECIFIC GRAVITY, URINE: 1.01 (ref 1.003–1.035)
Urobilinogen, UR: 0.2 mg/dL (ref 0.2–1)
pH: 6 (ref 4.6–8.0)

## 2017-05-03 LAB — CBC WITH DIFFERENTIAL/PLATELET
BASO%: 2.4 % — ABNORMAL HIGH (ref 0.0–2.0)
BASOS ABS: 0.1 10*3/uL (ref 0.0–0.1)
EOS ABS: 0.3 10*3/uL (ref 0.0–0.5)
EOS%: 4.7 % (ref 0.0–7.0)
HEMATOCRIT: 38.1 % (ref 34.8–46.6)
HEMOGLOBIN: 12.6 g/dL (ref 11.6–15.9)
LYMPH#: 0.9 10*3/uL (ref 0.9–3.3)
LYMPH%: 16.2 % (ref 14.0–49.7)
MCH: 31.1 pg (ref 25.1–34.0)
MCHC: 32.9 g/dL (ref 31.5–36.0)
MCV: 94.4 fL (ref 79.5–101.0)
MONO#: 0.6 10*3/uL (ref 0.1–0.9)
MONO%: 10.3 % (ref 0.0–14.0)
NEUT%: 66.4 % (ref 38.4–76.8)
NEUTROS ABS: 3.7 10*3/uL (ref 1.5–6.5)
PLATELETS: 353 10*3/uL (ref 145–400)
RBC: 4.04 10*6/uL (ref 3.70–5.45)
RDW: 17.1 % — AB (ref 11.2–14.5)
WBC: 5.6 10*3/uL (ref 3.9–10.3)

## 2017-05-03 MED ORDER — HEPARIN SOD (PORK) LOCK FLUSH 100 UNIT/ML IV SOLN
500.0000 [IU] | Freq: Once | INTRAVENOUS | Status: AC | PRN
  Administered 2017-05-03: 500 [IU]
  Filled 2017-05-03: qty 5

## 2017-05-03 MED ORDER — SODIUM CHLORIDE 0.9% FLUSH
10.0000 mL | INTRAVENOUS | Status: DC | PRN
  Administered 2017-05-03: 10 mL via INTRAVENOUS
  Filled 2017-05-03: qty 10

## 2017-05-03 MED ORDER — DIPHENHYDRAMINE HCL 50 MG/ML IJ SOLN
25.0000 mg | Freq: Once | INTRAMUSCULAR | Status: AC
  Administered 2017-05-03: 25 mg via INTRAVENOUS

## 2017-05-03 MED ORDER — PACLITAXEL CHEMO INJECTION 300 MG/50ML
80.0000 mg/m2 | Freq: Once | INTRAVENOUS | Status: AC
  Administered 2017-05-03: 114 mg via INTRAVENOUS
  Filled 2017-05-03: qty 19

## 2017-05-03 MED ORDER — SODIUM CHLORIDE 0.9 % IV SOLN
20.0000 mg | Freq: Once | INTRAVENOUS | Status: AC
  Administered 2017-05-03: 20 mg via INTRAVENOUS
  Filled 2017-05-03: qty 2

## 2017-05-03 MED ORDER — SODIUM CHLORIDE 0.9 % IV SOLN
Freq: Once | INTRAVENOUS | Status: AC
  Administered 2017-05-03: 10:00:00 via INTRAVENOUS

## 2017-05-03 MED ORDER — FAMOTIDINE IN NACL 20-0.9 MG/50ML-% IV SOLN
20.0000 mg | Freq: Once | INTRAVENOUS | Status: AC
  Administered 2017-05-03: 20 mg via INTRAVENOUS

## 2017-05-03 MED ORDER — FAMOTIDINE IN NACL 20-0.9 MG/50ML-% IV SOLN
INTRAVENOUS | Status: AC
  Filled 2017-05-03: qty 50

## 2017-05-03 MED ORDER — DIPHENHYDRAMINE HCL 50 MG/ML IJ SOLN
INTRAMUSCULAR | Status: AC
  Filled 2017-05-03: qty 1

## 2017-05-03 MED ORDER — SODIUM CHLORIDE 0.9% FLUSH
10.0000 mL | INTRAVENOUS | Status: DC | PRN
  Administered 2017-05-03: 10 mL
  Filled 2017-05-03: qty 10

## 2017-05-03 NOTE — Patient Instructions (Signed)
[facility] Discharge Instructions for Patients Receiving Chemotherapy  Today you received the following chemotherapy agents:  Taxol.  To help prevent nausea and vomiting after your treatment, we encourage you to take your nausea medication as directed.   If you develop nausea and vomiting that is not controlled by your nausea medication, call the clinic.   BELOW ARE SYMPTOMS THAT SHOULD BE REPORTED IMMEDIATELY:  *FEVER GREATER THAN 100.5 F  *CHILLS WITH OR WITHOUT FEVER  NAUSEA AND VOMITING THAT IS NOT CONTROLLED WITH YOUR NAUSEA MEDICATION  *UNUSUAL SHORTNESS OF BREATH  *UNUSUAL BRUISING OR BLEEDING  TENDERNESS IN MOUTH AND THROAT WITH OR WITHOUT PRESENCE OF ULCERS  *URINARY PROBLEMS  *BOWEL PROBLEMS  UNUSUAL RASH Items with * indicate a potential emergency and should be followed up as soon as possible.  Feel free to call the clinic should you have any questions or concerns. The clinic phone number is [phone].  Please show the CHEMO ALERT CARD at check-in to the Emergency Department and triage nurse.   

## 2017-05-03 NOTE — Progress Notes (Signed)
Patient Care Team: [name], MD as PCP - General (Family Medicine) Nicholas Lose, MD as Consulting Physician (Hematology and Oncology) [name], MD as Consulting Physician (General Surgery) Gery Pray, MD as Consulting Physician (Radiation Oncology)  DIAGNOSIS:  Encounter Diagnosis  Name Primary?  . Malignant neoplasm of upper-outer quadrant of right breast in female, estrogen receptor positive ([place])     SUMMARY OF ONCOLOGIC HISTORY:   Malignant neoplasm of upper-outer quadrant of right breast in female, estrogen receptor positive ([place])   12/05/2016 Initial Diagnosis    Screening detected calcifications Rt breast spanning 1.5 cm, U/S measured a lesion 1.2 cm size, Biopsy revealed IDC Grade 3, ER/PR Negative, Her 2 Neg Ratio: 1.05; Ki 67: 20%; T1bN0 Stage 1A Clinical stage      12/24/2016 Genetic Testing    The patient had genetic testing due to a personal and family history of breast cancer.  The [facility] Common Hereditary Cancer Panel + Melanoma panel was sent out. The Hereditary Gene Panel + Melanoma Panel offered by [facility] includes sequencing and/or deletion duplication testing of the following 53 genes: APC, ATM, AXIN2, BAP1, BARD1, BMPR1A, BRCA1, BRCA2, BRIP1, CDH1, CDK4, CDKN2A (p14ARF), CDKN2A (p16INK4a), CHEK2, CTNNA1, DICER1, EPCAM (Deletion/duplication testing only), GREM1 (promoter region deletion/duplication testing only), KIT, MC1R, MEN1, MLH1, MSH2, MSH3, MSH6, MUTYH, NBN, NF1, NHTL1, PALB2, PDGFRA, PMS2, POLD1, POLE, POT1, PTEN, RAD50, RAD51C, RAD51D, RB1, SDHB, SDHC, SDHD, SMAD4, SMARCA4. STK11, TERT, TP53, TSC1, TSC2, and VHL.  The following genes were evaluated for sequence changes only: MITF, SDHA and HOXB13 c.251G>A variant only.  Results: No pathogenic mutations identified. 3 VU'sS in BRCA1 c.2967T>A (p.Phe989Leu) , MSH6 c.2857G>A (p.Glu953Lys), and TERT c.902G>A (p.Arg301His) were identified.  The date of this test result is 12/24/2016.        12/28/2016  Surgery    Right lumpectomy: IDC grade 2, 1.2 cm, broadly 0.1 cm to the posterior margin, 0/5 lymph nodes negative, ER 0%, PR 0%, HER-2 negative ratio 1.05, Ki-67 5%, T1 cN0 stage IB AJCC 8      01/25/2017 -  Chemotherapy    Adjuvant chemotherapy with dose dense Adriamycin and Cytoxan 4 followed by Taxol weekly 12        CHIEF COMPLIANT: Cycle 6 Taxol  INTERVAL HISTORY: Lorraine Dean is a 51 year old with above-mentioned history of right breast cancer treated with lumpectomy and is on adjuvant chemotherapy.  Last week she was not feeling well so chemo was held and she is here today to receive her 6 cycle of Taxol.  Overall she is felt much better she denies any fevers or chills.  Denies any nausea vomiting.  She is ready to resume her chemotherapy.  Her upper respiratory infection symptoms have improved with antibiotics.  REVIEW OF SYSTEMS:   Constitutional: Denies fevers, chills or abnormal weight loss Eyes: Denies blurriness of vision Ears, nose, mouth, throat, and face: Denies mucositis or sore throat Respiratory: Upper respiratory tract infection symptoms have improved Cardiovascular: Denies palpitation, chest discomfort Gastrointestinal:  Denies nausea, heartburn or change in bowel habits Skin: Denies abnormal skin rashes Lymphatics: Denies new lymphadenopathy or easy bruising Neurological:Denies numbness, tingling or new weaknesses Behavioral/Psych: Mood is stable, no new changes  Extremities: No lower extremity edema Breast:  denies any pain or lumps or nodules in either breasts All other systems were reviewed with the patient and are negative.  I have reviewed the past medical history, past surgical history, social history and family history with the patient and they are unchanged from  previous note.  ALLERGIES:  has No Known Allergies.  MEDICATIONS:  Current Outpatient Medications  Medication Sig Dispense Refill  . acetaminophen (TYLENOL) 500 MG tablet Take 500 mg by  mouth every 6 (six) hours as needed.    Marland Kitchen albuterol (PROVENTIL HFA;VENTOLIN HFA) 108 (90 Base) MCG/ACT inhaler Inhale 1-2 puffs into the lungs every 4 (four) hours as needed for wheezing or shortness of breath.    Marland Kitchen aspirin EC 81 MG tablet Take 81 mg by mouth daily.    Marland Kitchen azithromycin (ZITHROMAX) 250 MG tablet TAKE 2 TABLETS BY MOUTH TODAY, THEN TAKE 1 TABLET DAILY FOR 4 DAYS  0  . benzonatate (TESSALON) 100 MG capsule     . Biotin 1000 MCG tablet Take 1,000 mcg by mouth 3 (three) times daily.    . calcium-vitamin D (OSCAL WITH D) 500-200 MG-UNIT tablet Take 1 tablet by mouth.    . dexamethasone (DECADRON) 4 MG tablet Take 1 tablet (4 mg total) by mouth daily. Take 1 tablet day after chemotherapy and one more tablet the following day with food 8 tablet 0  . fluticasone (FLONASE) 50 MCG/ACT nasal spray Place 1 spray into both nostrils as needed for allergies or rhinitis.    [name] Oil 1000 MG CAPS Take by mouth.    . levofloxacin (LEVAQUIN) 500 MG tablet Take 1 tablet (500 mg total) by mouth daily for 10 days. 10 tablet 0  . lidocaine-prilocaine (EMLA) cream Apply to affected area once 30 g 3  . LORazepam (ATIVAN) 0.5 MG tablet Take 1 tablet (0.5 mg total) by mouth at bedtime. 30 tablet 0  . mometasone (NASONEX) 50 MCG/ACT nasal spray 2 SPRAYS IN EACH NOSTRIL ONCE A DAY IF NEEDED NASALLY  2  . Multiple Vitamins-Minerals (OCUVITE ADULT FORMULA) CAPS Take by mouth.    . ondansetron (ZOFRAN) 8 MG tablet Take 1 tablet (8 mg total) by mouth 2 (two) times daily as needed. Start on the third day after chemotherapy. 30 tablet 1  . potassium chloride (K-DUR) 10 MEQ tablet Take 2 tablets (20 mEq total) by mouth 2 (two) times daily. 120 tablet 0  . prochlorperazine (COMPAZINE) 10 MG tablet Take 1 tablet (10 mg total) by mouth every 6 (six) hours as needed (Nausea or vomiting). 30 tablet 1   No current facility-administered medications for this visit.     PHYSICAL EXAMINATION: ECOG PERFORMANCE STATUS: 1 -  Symptomatic but completely ambulatory  Vitals:   05/03/17 0825  BP: 117/65  Pulse: 96  Resp: 16  Temp: 97.9 F (36.6 C)  SpO2: 99%   Filed Weights   05/03/17 0825  Weight: 103 lb 11.2 oz (47 kg)    GENERAL:alert, no distress and comfortable SKIN: skin color, texture, turgor are normal, no rashes or significant lesions EYES: normal, Conjunctiva are pink and non-injected, sclera clear OROPHARYNX:no exudate, no erythema and lips, buccal mucosa, and tongue normal  NECK: supple, thyroid normal size, non-tender, without nodularity LYMPH:  no palpable lymphadenopathy in the cervical, axillary or inguinal LUNGS: clear to auscultation and percussion with normal breathing effort HEART: regular rate & rhythm and no murmurs and no lower extremity edema ABDOMEN:abdomen soft, non-tender and normal bowel sounds MUSCULOSKELETAL:no cyanosis of digits and no clubbing  NEURO: alert & oriented x 3 with fluent speech, no focal motor/sensory deficits EXTREMITIES: No lower extremity edema  LABORATORY DATA:  I have reviewed the data as listed   Chemistry      Component Value Date/Time   NA 140 04/26/2017  0754   K 2.9 (LL) 04/26/2017 0754   CO2 22 04/26/2017 0754   BUN 7.3 04/26/2017 0754   CREATININE 0.6 04/26/2017 0754      Component Value Date/Time   CALCIUM 8.7 04/26/2017 0754   ALKPHOS 76 04/26/2017 0754   AST 29 04/26/2017 0754   ALT 35 04/26/2017 0754   BILITOT 0.23 04/26/2017 0754       Lab Results  Component Value Date   WBC 5.6 05/03/2017   HGB 12.6 05/03/2017   HCT 38.1 05/03/2017   MCV 94.4 05/03/2017   PLT 353 05/03/2017   NEUTROABS 3.7 05/03/2017    ASSESSMENT & PLAN:  Malignant neoplasm of upper-outer quadrant of right breast in female, estrogen receptor positive ([place]) 12/28/2016: Right lumpectomy: IDC grade 2, 1.2 cm, broadly 0.1 cm to the posterior margin, 0/5 lymph nodes negative, ER 0%, PR 0%, HER-2 negative ratio 1.05, Ki-67 5%, T1 cN0 stage IB AJCC  8  Recommendation: 1. Adjuvant chemotherapy with dose dense Adriamycin and Cytoxan every 2 weeks 4 followed by weekly Taxol 12 2. followed by adjuvant radiation therapy ---------------------------------------------------------------------- Current treatment: Week 6 taxol Toxicities: Denies any nausea vomiting. Skin and nail changes on the fingers. Denies neuropathy.  RTC weekly for chemo   I spent 25 minutes talking to the patient of which more than half was spent in counseling and coordination of care.  No orders of the defined types were placed in this encounter.  The patient has a good understanding of the overall plan. she agrees with it. she will call with any problems that may develop before the next visit here.   Harriette Ohara, MD 05/03/17

## 2017-05-03 NOTE — Telephone Encounter (Signed)
Scheduled appt per 12/28 los - patient to get updated appts in treatment area / next appt.

## 2017-05-09 NOTE — Assessment & Plan Note (Signed)
12/28/2016: Right lumpectomy: IDC grade 2, 1.2 cm, broadly 0.1 cm to the posterior margin, 0/5 lymph nodes negative, ER 0%, PR 0%, HER-2 negative ratio 1.05, Ki-67 5%, T1 cN0 stage IB AJCC 8  Recommendation: 1. Adjuvant chemotherapy with dose dense Adriamycin and Cytoxan every 2 weeks 4 followed by weekly Taxol 12 2. followed by adjuvant radiation therapy ---------------------------------------------------------------------- Current treatment:Week 8 taxol Toxicities: Denies any nausea vomiting. Skin and nail changes on the fingers. Denies neuropathy.  RTC weekly for chemo

## 2017-05-10 ENCOUNTER — Inpatient Hospital Stay: Payer: BLUE CROSS/BLUE SHIELD | Attending: Hematology and Oncology | Admitting: Hematology and Oncology

## 2017-05-10 ENCOUNTER — Ambulatory Visit: Payer: BLUE CROSS/BLUE SHIELD

## 2017-05-10 ENCOUNTER — Ambulatory Visit (HOSPITAL_BASED_OUTPATIENT_CLINIC_OR_DEPARTMENT_OTHER): Payer: BLUE CROSS/BLUE SHIELD

## 2017-05-10 ENCOUNTER — Other Ambulatory Visit (HOSPITAL_BASED_OUTPATIENT_CLINIC_OR_DEPARTMENT_OTHER): Payer: BLUE CROSS/BLUE SHIELD

## 2017-05-10 DIAGNOSIS — Z95828 Presence of other vascular implants and grafts: Secondary | ICD-10-CM

## 2017-05-10 DIAGNOSIS — N644 Mastodynia: Secondary | ICD-10-CM | POA: Insufficient documentation

## 2017-05-10 DIAGNOSIS — G620 Drug-induced polyneuropathy: Secondary | ICD-10-CM | POA: Insufficient documentation

## 2017-05-10 DIAGNOSIS — Z170 Estrogen receptor positive status [ER+]: Secondary | ICD-10-CM | POA: Diagnosis not present

## 2017-05-10 DIAGNOSIS — C50411 Malignant neoplasm of upper-outer quadrant of right female breast: Secondary | ICD-10-CM

## 2017-05-10 DIAGNOSIS — Z803 Family history of malignant neoplasm of breast: Secondary | ICD-10-CM | POA: Insufficient documentation

## 2017-05-10 DIAGNOSIS — Z5111 Encounter for antineoplastic chemotherapy: Secondary | ICD-10-CM

## 2017-05-10 DIAGNOSIS — L603 Nail dystrophy: Secondary | ICD-10-CM

## 2017-05-10 DIAGNOSIS — Z801 Family history of malignant neoplasm of trachea, bronchus and lung: Secondary | ICD-10-CM | POA: Insufficient documentation

## 2017-05-10 LAB — CBC WITH DIFFERENTIAL/PLATELET
BASO%: 1 % (ref 0.0–2.0)
Basophils Absolute: 0.1 10*3/uL (ref 0.0–0.1)
EOS ABS: 0.3 10*3/uL (ref 0.0–0.5)
EOS%: 6.6 % (ref 0.0–7.0)
HCT: 36.5 % (ref 34.8–46.6)
HEMOGLOBIN: 12.1 g/dL (ref 11.6–15.9)
LYMPH%: 18 % (ref 14.0–49.7)
MCH: 31.5 pg (ref 25.1–34.0)
MCHC: 33.2 g/dL (ref 31.5–36.0)
MCV: 95.1 fL (ref 79.5–101.0)
MONO#: 0.3 10*3/uL (ref 0.1–0.9)
MONO%: 6.2 % (ref 0.0–14.0)
NEUT#: 3.4 10*3/uL (ref 1.5–6.5)
NEUT%: 68.2 % (ref 38.4–76.8)
Platelets: 272 10*3/uL (ref 145–400)
RBC: 3.84 10*6/uL (ref 3.70–5.45)
RDW: 14.5 % (ref 11.2–14.5)
WBC: 5 10*3/uL (ref 3.9–10.3)
lymph#: 0.9 10*3/uL (ref 0.9–3.3)

## 2017-05-10 LAB — COMPREHENSIVE METABOLIC PANEL
ALBUMIN: 3.7 g/dL (ref 3.5–5.0)
ALK PHOS: 115 U/L (ref 40–150)
ALT: 47 U/L (ref 0–55)
AST: 30 U/L (ref 5–34)
Anion Gap: 8 mEq/L (ref 3–11)
BUN: 9.8 mg/dL (ref 7.0–26.0)
CO2: 24 mEq/L (ref 22–29)
Calcium: 9.3 mg/dL (ref 8.4–10.4)
Chloride: 103 mEq/L (ref 98–109)
Creatinine: 0.7 mg/dL (ref 0.6–1.1)
GLUCOSE: 106 mg/dL (ref 70–140)
POTASSIUM: 3.9 meq/L (ref 3.5–5.1)
SODIUM: 135 meq/L — AB (ref 136–145)
Total Bilirubin: 0.28 mg/dL (ref 0.20–1.20)
Total Protein: 6.6 g/dL (ref 6.4–8.3)

## 2017-05-10 MED ORDER — FAMOTIDINE IN NACL 20-0.9 MG/50ML-% IV SOLN
20.0000 mg | Freq: Once | INTRAVENOUS | Status: AC
  Administered 2017-05-10: 20 mg via INTRAVENOUS

## 2017-05-10 MED ORDER — DIPHENHYDRAMINE HCL 50 MG/ML IJ SOLN
25.0000 mg | Freq: Once | INTRAMUSCULAR | Status: AC
  Administered 2017-05-10: 25 mg via INTRAVENOUS

## 2017-05-10 MED ORDER — SODIUM CHLORIDE 0.9 % IV SOLN
Freq: Once | INTRAVENOUS | Status: AC
  Administered 2017-05-10: 09:00:00 via INTRAVENOUS

## 2017-05-10 MED ORDER — HEPARIN SOD (PORK) LOCK FLUSH 100 UNIT/ML IV SOLN
500.0000 [IU] | Freq: Once | INTRAVENOUS | Status: AC | PRN
  Administered 2017-05-10: 500 [IU]
  Filled 2017-05-10: qty 5

## 2017-05-10 MED ORDER — DIPHENHYDRAMINE HCL 50 MG/ML IJ SOLN
INTRAMUSCULAR | Status: AC
  Filled 2017-05-10: qty 1

## 2017-05-10 MED ORDER — SODIUM CHLORIDE 0.9% FLUSH
10.0000 mL | INTRAVENOUS | Status: DC | PRN
  Administered 2017-05-10: 10 mL
  Filled 2017-05-10: qty 10

## 2017-05-10 MED ORDER — SODIUM CHLORIDE 0.9% FLUSH
10.0000 mL | INTRAVENOUS | Status: DC | PRN
  Administered 2017-05-10: 10 mL via INTRAVENOUS
  Filled 2017-05-10: qty 10

## 2017-05-10 MED ORDER — PACLITAXEL CHEMO INJECTION 300 MG/50ML
80.0000 mg/m2 | Freq: Once | INTRAVENOUS | Status: AC
  Administered 2017-05-10: 114 mg via INTRAVENOUS
  Filled 2017-05-10: qty 19

## 2017-05-10 MED ORDER — SODIUM CHLORIDE 0.9 % IV SOLN
20.0000 mg | Freq: Once | INTRAVENOUS | Status: AC
  Administered 2017-05-10: 20 mg via INTRAVENOUS
  Filled 2017-05-10: qty 2

## 2017-05-10 MED ORDER — FAMOTIDINE IN NACL 20-0.9 MG/50ML-% IV SOLN
INTRAVENOUS | Status: AC
  Filled 2017-05-10: qty 50

## 2017-05-10 NOTE — Patient Instructions (Signed)
[facility] Discharge Instructions for Patients Receiving Chemotherapy  Today you received the following chemotherapy agents: Paclitaxel (Taxol)  To help prevent nausea and vomiting after your treatment, we encourage you to take your nausea medication as prescribed. If you develop nausea and vomiting that is not controlled by your nausea medication, call the clinic.   BELOW ARE SYMPTOMS THAT SHOULD BE REPORTED IMMEDIATELY:  *FEVER GREATER THAN 100.5 F  *CHILLS WITH OR WITHOUT FEVER  NAUSEA AND VOMITING THAT IS NOT CONTROLLED WITH YOUR NAUSEA MEDICATION  *UNUSUAL SHORTNESS OF BREATH  *UNUSUAL BRUISING OR BLEEDING  TENDERNESS IN MOUTH AND THROAT WITH OR WITHOUT PRESENCE OF ULCERS  *URINARY PROBLEMS  *BOWEL PROBLEMS  UNUSUAL RASH Items with * indicate a potential emergency and should be followed up as soon as possible.  Feel free to call the clinic should you have any questions or concerns. The clinic phone number is [phone].  Please show the CHEMO ALERT CARD at check-in to the Emergency Department and triage nurse.   

## 2017-05-10 NOTE — Progress Notes (Signed)
Patient Care Team: [name], MD as PCP - General (Family Medicine) Nicholas Lose, MD as Consulting Physician (Hematology and Oncology) [name], MD as Consulting Physician (General Surgery) Gery Pray, MD as Consulting Physician (Radiation Oncology)  DIAGNOSIS:  Encounter Diagnosis  Name Primary?  . Malignant neoplasm of upper-outer quadrant of right breast in female, estrogen receptor positive ([place])     SUMMARY OF ONCOLOGIC HISTORY:   Malignant neoplasm of upper-outer quadrant of right breast in female, estrogen receptor positive ([place])   12/05/2016 Initial Diagnosis    Screening detected calcifications Rt breast spanning 1.5 cm, U/S measured a lesion 1.2 cm size, Biopsy revealed IDC Grade 3, ER/PR Negative, Her 2 Neg Ratio: 1.05; Ki 67: 20%; T1bN0 Stage 1A Clinical stage      12/24/2016 Genetic Testing    The patient had genetic testing due to a personal and family history of breast cancer.  The [facility] Common Hereditary Cancer Panel + Melanoma panel was sent out. The Hereditary Gene Panel + Melanoma Panel offered by [facility] includes sequencing and/or deletion duplication testing of the following 53 genes: APC, ATM, AXIN2, BAP1, BARD1, BMPR1A, BRCA1, BRCA2, BRIP1, CDH1, CDK4, CDKN2A (p14ARF), CDKN2A (p16INK4a), CHEK2, CTNNA1, DICER1, EPCAM (Deletion/duplication testing only), GREM1 (promoter region deletion/duplication testing only), KIT, MC1R, MEN1, MLH1, MSH2, MSH3, MSH6, MUTYH, NBN, NF1, NHTL1, PALB2, PDGFRA, PMS2, POLD1, POLE, POT1, PTEN, RAD50, RAD51C, RAD51D, RB1, SDHB, SDHC, SDHD, SMAD4, SMARCA4. STK11, TERT, TP53, TSC1, TSC2, and VHL.  The following genes were evaluated for sequence changes only: MITF, SDHA and HOXB13 c.251G>A variant only.  Results: No pathogenic mutations identified. 3 VU'sS in BRCA1 c.2967T>A (p.Phe989Leu) , MSH6 c.2857G>A (p.Glu953Lys), and TERT c.902G>A (p.Arg301His) were identified.  The date of this test result is 12/24/2016.        12/28/2016  Surgery    Right lumpectomy: IDC grade 2, 1.2 cm, broadly 0.1 cm to the posterior margin, 0/5 lymph nodes negative, ER 0%, PR 0%, HER-2 negative ratio 1.05, Ki-67 5%, T1 cN0 stage IB AJCC 8      01/25/2017 -  Chemotherapy    Adjuvant chemotherapy with dose dense Adriamycin and Cytoxan 4 followed by Taxol weekly 12        CHIEF COMPLIANT: Cycle 7 Taxol  INTERVAL HISTORY: Lorraine Dean is a 52 year old with above-mentioned history of right breast cancer treated with lumpectomy and is now on adjuvant chemotherapy and today is cycle 8 of Taxol.  Overall she is doing very well on Taxol but she does not have any nausea or vomiting.  She does have fatigue.  Denies any fevers or chills.  She does not have neuropathy.  He has darkening of the nail beds.  She had severe upper respiratory infection which has resolved.  REVIEW OF SYSTEMS:   Constitutional: Denies fevers, chills or abnormal weight loss Eyes: Denies blurriness of vision Ears, nose, mouth, throat, and face: Denies mucositis or sore throat Respiratory: Denies cough, dyspnea or wheezes Cardiovascular: Denies palpitation, chest discomfort Gastrointestinal:  Denies nausea, heartburn or change in bowel habits Skin: Denies abnormal skin rashes Lymphatics: Denies new lymphadenopathy or easy bruising Neurological:Denies numbness, tingling or new weaknesses Behavioral/Psych: Mood is stable, no new changes  Extremities: No lower extremity edema  All other systems were reviewed with the patient and are negative.  I have reviewed the past medical history, past surgical history, social history and family history with the patient and they are unchanged from previous note.  ALLERGIES:  has No Known Allergies.  MEDICATIONS:  Current Outpatient Medications  Medication Sig Dispense Refill  . acetaminophen (TYLENOL) 500 MG tablet Take 500 mg by mouth every 6 (six) hours as needed.    Marland Kitchen albuterol (PROVENTIL HFA;VENTOLIN HFA) 108 (90 Base)  MCG/ACT inhaler Inhale 1-2 puffs into the lungs every 4 (four) hours as needed for wheezing or shortness of breath.    Marland Kitchen aspirin EC 81 MG tablet Take 81 mg by mouth daily.    Marland Kitchen azithromycin (ZITHROMAX) 250 MG tablet TAKE 2 TABLETS BY MOUTH TODAY, THEN TAKE 1 TABLET DAILY FOR 4 DAYS  0  . benzonatate (TESSALON) 100 MG capsule     . Biotin 1000 MCG tablet Take 1,000 mcg by mouth 3 (three) times daily.    . calcium-vitamin D (OSCAL WITH D) 500-200 MG-UNIT tablet Take 1 tablet by mouth.    . dexamethasone (DECADRON) 4 MG tablet Take 1 tablet (4 mg total) by mouth daily. Take 1 tablet day after chemotherapy and one more tablet the following day with food 8 tablet 0  . fluticasone (FLONASE) 50 MCG/ACT nasal spray Place 1 spray into both nostrils as needed for allergies or rhinitis.    [name] Oil 1000 MG CAPS Take by mouth.    . lidocaine-prilocaine (EMLA) cream Apply to affected area once 30 g 3  . LORazepam (ATIVAN) 0.5 MG tablet Take 1 tablet (0.5 mg total) by mouth at bedtime. 30 tablet 0  . mometasone (NASONEX) 50 MCG/ACT nasal spray 2 SPRAYS IN EACH NOSTRIL ONCE A DAY IF NEEDED NASALLY  2  . Multiple Vitamins-Minerals (OCUVITE ADULT FORMULA) CAPS Take by mouth.    . ondansetron (ZOFRAN) 8 MG tablet Take 1 tablet (8 mg total) by mouth 2 (two) times daily as needed. Start on the third day after chemotherapy. 30 tablet 1  . potassium chloride (K-DUR) 10 MEQ tablet Take 2 tablets (20 mEq total) by mouth 2 (two) times daily. 120 tablet 0  . prochlorperazine (COMPAZINE) 10 MG tablet Take 1 tablet (10 mg total) by mouth every 6 (six) hours as needed (Nausea or vomiting). 30 tablet 1   No current facility-administered medications for this visit.     PHYSICAL EXAMINATION: ECOG PERFORMANCE STATUS: 1 - Symptomatic but completely ambulatory  Vitals:   05/10/17 0835  BP: (!) 115/59  Pulse: 90  Resp: 16  Temp: 98.1 F (36.7 C)  SpO2: 100%   Filed Weights   05/10/17 0835  Weight: 101 lb (45.8 kg)      GENERAL:alert, no distress and comfortable SKIN: skin color, texture, turgor are normal, no rashes or significant lesions EYES: normal, Conjunctiva are pink and non-injected, sclera clear OROPHARYNX:no exudate, no erythema and lips, buccal mucosa, and tongue normal  NECK: supple, thyroid normal size, non-tender, without nodularity LYMPH:  no palpable lymphadenopathy in the cervical, axillary or inguinal LUNGS: clear to auscultation and percussion with normal breathing effort HEART: regular rate & rhythm and no murmurs and no lower extremity edema ABDOMEN:abdomen soft, non-tender and normal bowel sounds MUSCULOSKELETAL:no cyanosis of digits and no clubbing  NEURO: alert & oriented x 3 with fluent speech, no focal motor/sensory deficits EXTREMITIES: No lower extremity edema  LABORATORY DATA:  I have reviewed the data as listed   Chemistry      Component Value Date/Time   NA 138 05/03/2017 0752   K 4.0 05/03/2017 0752   CO2 22 05/03/2017 0752   BUN 14.5 05/03/2017 0752   CREATININE 0.8 05/03/2017 0752      Component Value Date/Time  CALCIUM 10.0 05/03/2017 0752   ALKPHOS 128 05/03/2017 0752   AST 40 (H) 05/03/2017 0752   ALT 49 05/03/2017 0752   BILITOT 0.33 05/03/2017 0752       Lab Results  Component Value Date   WBC 5.0 05/10/2017   HGB 12.1 05/10/2017   HCT 36.5 05/10/2017   MCV 95.1 05/10/2017   PLT 272 05/10/2017   NEUTROABS 3.4 05/10/2017    ASSESSMENT & PLAN:  Malignant neoplasm of upper-outer quadrant of right breast in female, estrogen receptor positive ([place]) 12/28/2016: Right lumpectomy: IDC grade 2, 1.2 cm, broadly 0.1 cm to the posterior margin, 0/5 lymph nodes negative, ER 0%, PR 0%, HER-2 negative ratio 1.05, Ki-67 5%, T1 cN0 stage IB AJCC 8  Recommendation: 1. Adjuvant chemotherapy with dose dense Adriamycin and Cytoxan every 2 weeks 4 followed by weekly Taxol 12 2. followed by adjuvant radiation  therapy ---------------------------------------------------------------------- Current treatment:Week 7 taxol Toxicities: Denies any nausea vomiting. Skin and nail changes on the fingers. Denies neuropathy.  RTC weekly for chemo   I spent 25 minutes talking to the patient of which more than half was spent in counseling and coordination of care.  No orders of the defined types were placed in this encounter.  The patient has a good understanding of the overall plan. she agrees with it. she will call with any problems that may develop before the next visit here.   Harriette Ohara, MD 05/10/17

## 2017-05-17 ENCOUNTER — Inpatient Hospital Stay: Payer: BLUE CROSS/BLUE SHIELD

## 2017-05-17 ENCOUNTER — Other Ambulatory Visit: Payer: Self-pay | Admitting: Hematology and Oncology

## 2017-05-17 ENCOUNTER — Encounter: Payer: Self-pay | Admitting: *Deleted

## 2017-05-17 VITALS — BP 124/64 | HR 83 | Temp 98.3°F | Resp 18

## 2017-05-17 DIAGNOSIS — C50411 Malignant neoplasm of upper-outer quadrant of right female breast: Secondary | ICD-10-CM

## 2017-05-17 DIAGNOSIS — N644 Mastodynia: Secondary | ICD-10-CM | POA: Diagnosis not present

## 2017-05-17 DIAGNOSIS — Z801 Family history of malignant neoplasm of trachea, bronchus and lung: Secondary | ICD-10-CM | POA: Diagnosis not present

## 2017-05-17 DIAGNOSIS — Z803 Family history of malignant neoplasm of breast: Secondary | ICD-10-CM | POA: Diagnosis not present

## 2017-05-17 DIAGNOSIS — Z95828 Presence of other vascular implants and grafts: Secondary | ICD-10-CM

## 2017-05-17 DIAGNOSIS — Z170 Estrogen receptor positive status [ER+]: Principal | ICD-10-CM

## 2017-05-17 DIAGNOSIS — Z5111 Encounter for antineoplastic chemotherapy: Secondary | ICD-10-CM | POA: Diagnosis not present

## 2017-05-17 DIAGNOSIS — G620 Drug-induced polyneuropathy: Secondary | ICD-10-CM | POA: Diagnosis not present

## 2017-05-17 LAB — COMPREHENSIVE METABOLIC PANEL
ALT: 57 U/L — ABNORMAL HIGH (ref 0–55)
AST: 33 U/L (ref 5–34)
Albumin: 3.7 g/dL (ref 3.5–5.0)
Alkaline Phosphatase: 96 U/L (ref 40–150)
Anion gap: 10 (ref 3–11)
BUN: 8 mg/dL (ref 7–26)
CALCIUM: 9.2 mg/dL (ref 8.4–10.4)
CO2: 22 mmol/L (ref 22–29)
CREATININE: 0.7 mg/dL (ref 0.60–1.10)
Chloride: 107 mmol/L (ref 98–109)
GFR calc non Af Amer: >60 mL/min (ref >60)
GLUCOSE: 100 mg/dL (ref 70–140)
Potassium: 3.9 mmol/L (ref 3.3–4.7)
SODIUM: 139 mmol/L (ref 136–145)
Total Bilirubin: 0.3 mg/dL (ref 0.2–1.2)
Total Protein: 6.3 g/dL — ABNORMAL LOW (ref 6.4–8.3)

## 2017-05-17 LAB — CBC WITH DIFFERENTIAL/PLATELET
Basophils Absolute: 0.1 10*3/uL (ref 0.0–0.1)
Basophils Relative: 2 %
EOS ABS: 0.3 10*3/uL (ref 0.0–0.5)
Eosinophils Relative: 7 %
HCT: 35.7 % (ref 34.8–46.6)
HEMOGLOBIN: 12 g/dL (ref 11.6–15.9)
LYMPHS ABS: 0.7 10*3/uL — AB (ref 0.9–3.3)
LYMPHS PCT: 19 %
MCH: 31.9 pg (ref 25.1–34.0)
MCHC: 33.5 g/dL (ref 31.5–36.0)
MCV: 95.1 fL (ref 79.5–101.0)
Monocytes Absolute: 0.3 10*3/uL (ref 0.1–0.9)
Monocytes Relative: 8 %
NEUTROS PCT: 64 %
Neutro Abs: 2.4 10*3/uL (ref 1.5–6.5)
Platelets: 251 10*3/uL (ref 145–400)
RBC: 3.75 MIL/uL (ref 3.70–5.45)
RDW: 15.5 % (ref 11.2–16.1)
WBC: 3.8 10*3/uL — AB (ref 3.9–10.3)

## 2017-05-17 MED ORDER — FAMOTIDINE IN NACL 20-0.9 MG/50ML-% IV SOLN
INTRAVENOUS | Status: AC
  Filled 2017-05-17: qty 50

## 2017-05-17 MED ORDER — SODIUM CHLORIDE 0.9 % IV SOLN
65.0000 mg/m2 | Freq: Once | INTRAVENOUS | Status: AC
  Administered 2017-05-17: 90 mg via INTRAVENOUS
  Filled 2017-05-17: qty 15

## 2017-05-17 MED ORDER — FAMOTIDINE IN NACL 20-0.9 MG/50ML-% IV SOLN
20.0000 mg | Freq: Once | INTRAVENOUS | Status: AC
  Administered 2017-05-17: 20 mg via INTRAVENOUS

## 2017-05-17 MED ORDER — SODIUM CHLORIDE 0.9% FLUSH
10.0000 mL | INTRAVENOUS | Status: DC | PRN
  Administered 2017-05-17: 10 mL via INTRAVENOUS
  Filled 2017-05-17: qty 10

## 2017-05-17 MED ORDER — SODIUM CHLORIDE 0.9% FLUSH
10.0000 mL | INTRAVENOUS | Status: DC | PRN
  Administered 2017-05-17: 10 mL
  Filled 2017-05-17: qty 10

## 2017-05-17 MED ORDER — SODIUM CHLORIDE 0.9 % IV SOLN
Freq: Once | INTRAVENOUS | Status: AC
  Administered 2017-05-17: 09:00:00 via INTRAVENOUS

## 2017-05-17 MED ORDER — DIPHENHYDRAMINE HCL 50 MG/ML IJ SOLN
25.0000 mg | Freq: Once | INTRAMUSCULAR | Status: AC
  Administered 2017-05-17: 25 mg via INTRAVENOUS

## 2017-05-17 MED ORDER — DEXAMETHASONE SODIUM PHOSPHATE 100 MG/10ML IJ SOLN
20.0000 mg | Freq: Once | INTRAMUSCULAR | Status: AC
  Administered 2017-05-17: 20 mg via INTRAVENOUS
  Filled 2017-05-17: qty 2

## 2017-05-17 MED ORDER — HEPARIN SOD (PORK) LOCK FLUSH 100 UNIT/ML IV SOLN
500.0000 [IU] | Freq: Once | INTRAVENOUS | Status: AC | PRN
  Administered 2017-05-17: 500 [IU]
  Filled 2017-05-17: qty 5

## 2017-05-17 MED ORDER — DIPHENHYDRAMINE HCL 50 MG/ML IJ SOLN
INTRAMUSCULAR | Status: AC
  Filled 2017-05-17: qty 1

## 2017-05-17 NOTE — Patient Instructions (Signed)
[facility] Discharge Instructions for Patients Receiving Chemotherapy  Today you received the following chemotherapy agents Taxol  To help prevent nausea and vomiting after your treatment, we encourage you to take your nausea medication as needed   If you develop nausea and vomiting that is not controlled by your nausea medication, call the clinic.   BELOW ARE SYMPTOMS THAT SHOULD BE REPORTED IMMEDIATELY:  *FEVER GREATER THAN 100.5 F  *CHILLS WITH OR WITHOUT FEVER  NAUSEA AND VOMITING THAT IS NOT CONTROLLED WITH YOUR NAUSEA MEDICATION  *UNUSUAL SHORTNESS OF BREATH  *UNUSUAL BRUISING OR BLEEDING  TENDERNESS IN MOUTH AND THROAT WITH OR WITHOUT PRESENCE OF ULCERS  *URINARY PROBLEMS  *BOWEL PROBLEMS  UNUSUAL RASH Items with * indicate a potential emergency and should be followed up as soon as possible.  Feel free to call the clinic should you have any questions or concerns. The clinic phone number is [phone].  Please show the [place] at check-in to the Emergency Department and triage nurse.

## 2017-05-17 NOTE — Progress Notes (Signed)
Received call from [facility] in infusion regarding pt experiencing peripheral neuropathy grade 1 in fingertips that "comes and goes and doesn't interfere with ADL's ".  Notified Dr Lindi Adie and he put in orders to decrease dosage of Taxol. Notified pharmacy and Ronaldo Miyamoto.

## 2017-05-24 ENCOUNTER — Inpatient Hospital Stay: Payer: BLUE CROSS/BLUE SHIELD

## 2017-05-24 ENCOUNTER — Encounter: Payer: Self-pay | Admitting: Adult Health

## 2017-05-24 ENCOUNTER — Telehealth: Payer: Self-pay | Admitting: Adult Health

## 2017-05-24 ENCOUNTER — Inpatient Hospital Stay (HOSPITAL_BASED_OUTPATIENT_CLINIC_OR_DEPARTMENT_OTHER): Payer: BLUE CROSS/BLUE SHIELD | Admitting: Adult Health

## 2017-05-24 VITALS — BP 118/44 | HR 95 | Temp 98.0°F | Resp 20 | Ht 60.0 in | Wt 104.6 lb

## 2017-05-24 DIAGNOSIS — C50411 Malignant neoplasm of upper-outer quadrant of right female breast: Secondary | ICD-10-CM | POA: Diagnosis not present

## 2017-05-24 DIAGNOSIS — N644 Mastodynia: Secondary | ICD-10-CM | POA: Diagnosis not present

## 2017-05-24 DIAGNOSIS — Z170 Estrogen receptor positive status [ER+]: Principal | ICD-10-CM

## 2017-05-24 DIAGNOSIS — G620 Drug-induced polyneuropathy: Secondary | ICD-10-CM | POA: Diagnosis not present

## 2017-05-24 DIAGNOSIS — Z803 Family history of malignant neoplasm of breast: Secondary | ICD-10-CM

## 2017-05-24 DIAGNOSIS — Z801 Family history of malignant neoplasm of trachea, bronchus and lung: Secondary | ICD-10-CM | POA: Diagnosis not present

## 2017-05-24 DIAGNOSIS — N63 Unspecified lump in unspecified breast: Secondary | ICD-10-CM

## 2017-05-24 LAB — COMPREHENSIVE METABOLIC PANEL
ALBUMIN: 3.9 g/dL (ref 3.5–5.0)
ALT: 45 U/L (ref 0–55)
ANION GAP: 10 (ref 3–11)
AST: 28 U/L (ref 5–34)
Alkaline Phosphatase: 90 U/L (ref 40–150)
BUN: 10 mg/dL (ref 7–26)
CHLORIDE: 105 mmol/L (ref 98–109)
CO2: 25 mmol/L (ref 22–29)
Calcium: 9.4 mg/dL (ref 8.4–10.4)
Creatinine, Ser: 0.7 mg/dL (ref 0.60–1.10)
GFR calc Af Amer: >60 mL/min (ref >60)
GFR calc non Af Amer: >60 mL/min (ref >60)
GLUCOSE: 107 mg/dL (ref 70–140)
POTASSIUM: 3.6 mmol/L (ref 3.3–4.7)
SODIUM: 140 mmol/L (ref 136–145)
Total Bilirubin: 0.3 mg/dL (ref 0.2–1.2)
Total Protein: 6.8 g/dL (ref 6.4–8.3)

## 2017-05-24 LAB — CBC WITH DIFFERENTIAL/PLATELET
Basophils Absolute: 0.1 10*3/uL (ref 0.0–0.1)
Basophils Relative: 1 %
Eosinophils Absolute: 0.1 10*3/uL (ref 0.0–0.5)
Eosinophils Relative: 4 %
HEMATOCRIT: 38.2 % (ref 34.8–46.6)
Hemoglobin: 12.6 g/dL (ref 11.6–15.9)
LYMPHS ABS: 0.7 10*3/uL — AB (ref 0.9–3.3)
LYMPHS PCT: 21 %
MCH: 31.4 pg (ref 25.1–34.0)
MCHC: 33 g/dL (ref 31.5–36.0)
MCV: 95.3 fL (ref 79.5–101.0)
MONO ABS: 0.3 10*3/uL (ref 0.1–0.9)
Monocytes Relative: 9 %
NEUTROS ABS: 2.3 10*3/uL (ref 1.5–6.5)
Neutrophils Relative %: 65 %
Platelets: 252 10*3/uL (ref 145–400)
RBC: 4.01 MIL/uL (ref 3.70–5.45)
RDW: 14.1 % (ref 11.2–16.1)
WBC: 3.5 10*3/uL — ABNORMAL LOW (ref 3.9–10.3)

## 2017-05-24 MED ORDER — DIPHENHYDRAMINE HCL 50 MG/ML IJ SOLN
25.0000 mg | Freq: Once | INTRAMUSCULAR | Status: AC
  Administered 2017-05-24: 25 mg via INTRAVENOUS

## 2017-05-24 MED ORDER — SODIUM CHLORIDE 0.9 % IV SOLN: Freq: Once | INTRAVENOUS | Status: DC

## 2017-05-24 MED ORDER — FAMOTIDINE IN NACL 20-0.9 MG/50ML-% IV SOLN
20.0000 mg | Freq: Once | INTRAVENOUS | Status: AC
  Administered 2017-05-24: 20 mg via INTRAVENOUS

## 2017-05-24 MED ORDER — SODIUM CHLORIDE 0.9 % IV SOLN
65.0000 mg/m2 | Freq: Once | INTRAVENOUS | Status: AC
  Administered 2017-05-24: 90 mg via INTRAVENOUS
  Filled 2017-05-24: qty 15

## 2017-05-24 MED ORDER — FAMOTIDINE IN NACL 20-0.9 MG/50ML-% IV SOLN
INTRAVENOUS | Status: AC
  Filled 2017-05-24: qty 50

## 2017-05-24 MED ORDER — HEPARIN SOD (PORK) LOCK FLUSH 100 UNIT/ML IV SOLN
500.0000 [IU] | Freq: Once | INTRAVENOUS | Status: AC | PRN
  Administered 2017-05-24: 500 [IU]
  Filled 2017-05-24: qty 5

## 2017-05-24 MED ORDER — SODIUM CHLORIDE 0.9 % IV SOLN
20.0000 mg | Freq: Once | INTRAVENOUS | Status: AC
  Administered 2017-05-24: 20 mg via INTRAVENOUS
  Filled 2017-05-24: qty 2

## 2017-05-24 MED ORDER — DIPHENHYDRAMINE HCL 50 MG/ML IJ SOLN
INTRAMUSCULAR | Status: AC
  Filled 2017-05-24: qty 1

## 2017-05-24 MED ORDER — SODIUM CHLORIDE 0.9% FLUSH
10.0000 mL | INTRAVENOUS | Status: DC | PRN
  Administered 2017-05-24: 10 mL
  Filled 2017-05-24: qty 10

## 2017-05-24 NOTE — Progress Notes (Signed)
[name] Follow up:    [name], MD [place] [address] [address]   DIAGNOSIS: Cancer Staging Malignant neoplasm of upper-outer quadrant of right breast in female, estrogen receptor positive ([place]) Staging form: Breast, AJCC 8th Edition - Clinical stage from 12/12/2016: Stage IB (cT1c, cN0, cM0, G3, ER: Negative, PR: Negative, HER2: Negative) - Unsigned   SUMMARY OF ONCOLOGIC HISTORY:   Malignant neoplasm of upper-outer quadrant of right breast in female, estrogen receptor positive ([place])   12/05/2016 Initial Diagnosis    Screening detected calcifications Rt breast spanning 1.5 cm, U/S measured a lesion 1.2 cm size, Biopsy revealed IDC Grade 3, ER/PR Negative, Her 2 Neg Ratio: 1.05; Ki 67: 20%; T1bN0 Stage 1A Clinical stage      12/24/2016 Genetic Testing    The patient had genetic testing due to a personal and family history of breast cancer.  The [facility] Common Hereditary Cancer Panel + Melanoma panel was sent out. The Hereditary Gene Panel + Melanoma Panel offered by [facility] includes sequencing and/or deletion duplication testing of the following 53 genes: APC, ATM, AXIN2, BAP1, BARD1, BMPR1A, BRCA1, BRCA2, BRIP1, CDH1, CDK4, CDKN2A (p14ARF), CDKN2A (p16INK4a), CHEK2, CTNNA1, DICER1, EPCAM (Deletion/duplication testing only), GREM1 (promoter region deletion/duplication testing only), KIT, MC1R, MEN1, MLH1, MSH2, MSH3, MSH6, MUTYH, NBN, NF1, NHTL1, PALB2, PDGFRA, PMS2, POLD1, POLE, POT1, PTEN, RAD50, RAD51C, RAD51D, RB1, SDHB, SDHC, SDHD, SMAD4, SMARCA4. STK11, TERT, TP53, TSC1, TSC2, and VHL.  The following genes were evaluated for sequence changes only: MITF, SDHA and HOXB13 c.251G>A variant only.  Results: No pathogenic mutations identified. 3 VU'sS in BRCA1 c.2967T>A (p.Phe989Leu) , MSH6 c.2857G>A (p.Glu953Lys), and TERT c.902G>A (p.Arg301His) were identified.  The date of this test result is 12/24/2016.        12/28/2016 Surgery    Right  lumpectomy: IDC grade 2, 1.2 cm, broadly 0.1 cm to the posterior margin, 0/5 lymph nodes negative, ER 0%, PR 0%, HER-2 negative ratio 1.05, Ki-67 5%, T1 cN0 stage IB AJCC 8      01/25/2017 -  Chemotherapy    Adjuvant chemotherapy with dose dense Adriamycin and Cytoxan 4 followed by Taxol weekly 12        CURRENT THERAPY: Taxol week 9  INTERVAL HISTORY: Lorraine Dean 52 y.o. female returns for evaluation prior to receiving Taxol.  She is doing well overall today.  She had the Taxol dose reduced last week due to peripheral neuropathy.  She still is experiencing peripheral neuropathy, however it is decreased.  She describes it as mild and intermittent in the very tips of her fingers.  She is concerned about a right breast pain that has been bugging her more.  She says that a lump has appeared in her breast that she is concerned about.     Patient Active Problem List   Diagnosis Date Noted  . Port catheter in place 01/25/2017  . Preoperative clearance 12/24/2016  . Palpitations 12/24/2016  . Hyperlipidemia 12/24/2016  . Family history of early CAD 12/24/2016  . Genetic testing 12/20/2016  . Family history of breast cancer 12/13/2016  . Family history of lung cancer 12/13/2016  . Malignant neoplasm of upper-outer quadrant of right breast in female, estrogen receptor positive ([place]) 12/11/2016    has No Known Allergies.  MEDICAL HISTORY: Past Medical History:  Diagnosis Date  . Anxiety   . Cancer [facility])    right breast cancer  . Chronic rhinitis   . Depression   . Family  history of breast cancer 12/13/2016  . Family history of lung cancer 12/13/2016  . High cholesterol   . HPV in female   . PONV (postoperative nausea and vomiting)     SURGICAL HISTORY: Past Surgical History:  Procedure Laterality Date  . BREAST BIOPSY Right 2018   triple negative IDC grade 3 tumor  . BREAST LUMPECTOMY WITH RADIOACTIVE SEED AND SENTINEL LYMPH NODE BIOPSY Right 12/28/2016   Procedure: RIGHT  BREAST LUMPECTOMY WITH RADIOACTIVE SEED AND SENTINEL LYMPH NODE MAPPING ERAS PATHWAY;  Surgeon: [name], MD;  Location: [facility];  Service: General;  Laterality: Right;  PEC BLOCK  . PORTACATH PLACEMENT Left 01/18/2017   Procedure: INSERTION PORT-A-CATH;  Surgeon: [name], MD;  Location: [facility];  Service: General;  Laterality: Left;  . WISDOM TOOTH EXTRACTION      SOCIAL HISTORY: Social History   Socioeconomic History  . Marital status: Single    Spouse name: Not on file  . Number of children: Not on file  . Years of education: Not on file  . Highest education level: Not on file  Social Needs  . Financial resource strain: Not on file  . Food insecurity - worry: Not on file  . Food insecurity - inability: Not on file  . Transportation needs - medical: Not on file  . Transportation needs - non-medical: Not on file  Occupational History  . Not on file  Tobacco Use  . Smoking status: Never Smoker  . Smokeless tobacco: Never Used  Substance and Sexual Activity  . Alcohol use: Yes    Comment: social  . Drug use: No  . Sexual activity: Yes    Birth control/protection: None    Comment: Intercourse 1 month ago was on the pill,then stoppedl  Other Topics Concern  . Not on file  Social History Narrative  . Not on file    FAMILY HISTORY: Family History  Problem Relation Age of Onset  . Heart disease Father 57       CABG  . Breast cancer Maternal Aunt 60  . Lung cancer Maternal Aunt        60's. unsure if it was a primary LG or a met from breast recurrence.  She had a brain met as well.  She died at 6.  . Lung cancer Paternal Aunt        dx. late 84's, died in 46's.  Marland Kitchen Heart disease Paternal Grandmother   . Heart disease Paternal Grandfather     Review of Systems  Constitutional: Negative for appetite change, chills, fatigue, fever and unexpected weight change.  HENT:   Negative for hearing loss, lump/mass, mouth sores and  trouble swallowing.   Eyes: Negative for eye problems and icterus.  Respiratory: Negative for chest tightness, cough and shortness of breath.   Cardiovascular: Negative for chest pain, leg swelling and palpitations.  Gastrointestinal: Negative for abdominal distention, abdominal pain, constipation, diarrhea, nausea and vomiting.  Endocrine: Negative for hot flashes.  Genitourinary: Negative for difficulty urinating.   Musculoskeletal: Negative for arthralgias.  Skin: Negative for itching and rash.  Neurological: Positive for numbness (see interval history). Negative for dizziness, extremity weakness and headaches.  Hematological: Negative for adenopathy. Does not bruise/bleed easily.  Psychiatric/Behavioral: Negative for depression. The patient is not nervous/anxious.       PHYSICAL EXAMINATION  ECOG PERFORMANCE STATUS: 1 - Symptomatic but completely ambulatory  Vitals:   05/24/17 0917  BP: (!) 118/44  Pulse: 95  Resp: 20  Temp: 98 F (36.7 C)  SpO2: 100%    Physical Exam  Constitutional: She is oriented to person, place, and time and well-developed, well-nourished, and in no distress.  HENT:  Head: Normocephalic and atraumatic.  Mouth/Throat: Oropharynx is clear and moist. No oropharyngeal exudate.  Eyes: Pupils are equal, round, and reactive to light. No scleral icterus.  Neck: Neck supple.  Cardiovascular: Normal rate, regular rhythm and normal heart sounds.  Pulmonary/Chest: Effort normal and breath sounds normal. No respiratory distress. She has no wheezes. She has no rales.  Right breast with small 0.5-1cm area of slight nodularity, ? Scar tissue underneath lumpectomy site.  The patient thinks this is new.  This is at approximately 7-8 o'clock about 2cmfn.  Abdominal: Soft. Bowel sounds are normal. She exhibits no distension and no mass. There is no tenderness. There is no rebound and no guarding.  Musculoskeletal: Normal range of motion. She exhibits no edema.   Lymphadenopathy:    She has no cervical adenopathy.  Neurological: She is alert and oriented to person, place, and time.  Skin: Skin is warm and dry. No rash noted. No erythema.  Psychiatric: Mood and affect normal.    LABORATORY DATA:  CBC    Component Value Date/Time   WBC 3.5 (L) 05/24/2017 0846   RBC 4.01 05/24/2017 0846   HGB 12.6 05/24/2017 0846   HGB 12.1 05/10/2017 0811   HCT 38.2 05/24/2017 0846   HCT 36.5 05/10/2017 0811   PLT 252 05/24/2017 0846   PLT 272 05/10/2017 0811   MCV 95.3 05/24/2017 0846   MCV 95.1 05/10/2017 0811   MCH 31.4 05/24/2017 0846   MCHC 33.0 05/24/2017 0846   RDW 14.1 05/24/2017 0846   RDW 14.5 05/10/2017 0811   LYMPHSABS 0.7 (L) 05/24/2017 0846   LYMPHSABS 0.9 05/10/2017 0811   MONOABS 0.3 05/24/2017 0846   MONOABS 0.3 05/10/2017 0811   EOSABS 0.1 05/24/2017 0846   EOSABS 0.3 05/10/2017 0811   BASOSABS 0.1 05/24/2017 0846   BASOSABS 0.1 05/10/2017 0811    CMP     Component Value Date/Time   NA 140 05/24/2017 0846   NA 135 (L) 05/10/2017 0811   K 3.6 05/24/2017 0846   K 3.9 05/10/2017 0811   CL 105 05/24/2017 0846   CO2 25 05/24/2017 0846   CO2 24 05/10/2017 0811   GLUCOSE 107 05/24/2017 0846   GLUCOSE 106 05/10/2017 0811   BUN 10 05/24/2017 0846   BUN 9.8 05/10/2017 0811   CREATININE 0.70 05/24/2017 0846   CREATININE 0.7 05/10/2017 0811   CALCIUM 9.4 05/24/2017 0846   CALCIUM 9.3 05/10/2017 0811   PROT 6.8 05/24/2017 0846   PROT 6.6 05/10/2017 0811   ALBUMIN 3.9 05/24/2017 0846   ALBUMIN 3.7 05/10/2017 0811   AST 28 05/24/2017 0846   AST 30 05/10/2017 0811   ALT 45 05/24/2017 0846   ALT 47 05/10/2017 0811   ALKPHOS 90 05/24/2017 0846   ALKPHOS 115 05/10/2017 0811   BILITOT 0.3 05/24/2017 0846   BILITOT 0.28 05/10/2017 0811   GFRNONAA >60 05/24/2017 0846   GFRAA >60 05/24/2017 0846       ASSESSMENT and PLAN:   Malignant neoplasm of upper-outer quadrant of right breast in female, estrogen receptor positive  ([place]) 12/28/2016: Right lumpectomy: IDC grade 2, 1.2 cm, broadly 0.1 cm to the posterior margin, 0/5 lymph nodes negative, ER 0%, PR 0%, HER-2 negative ratio 1.05, Ki-67 5%, T1 cN0 stage IB AJCC 8  Recommendation:  1. Adjuvant chemotherapy with dose dense Adriamycin and Cytoxan every 2 weeks 4 followed by weekly Taxol 12 2. followed by adjuvant radiation therapy ---------------------------------------------------------------------- Current treatment:Week 9 taxol Toxicities: Denies any nausea vomiting. Skin and nail changes on the fingers. Mild intermittent peripheral neuropathy: Taxol dose reduced, symptoms are improved. Breast pain and lump just below lumpectomy site: I think this is related to scar tissue, but we will get a right breast diagnostic mammogram and ultrasound to fully evaluate.  RTC weekly for chemo      Orders Placed This Encounter  Procedures  . MM DIAG BREAST TOMO UNI RIGHT    Standing Status:   Future    Standing Expiration Date:   07/23/2018    Order Specific Question:   Reason for Exam (SYMPTOM  OR DIAGNOSIS REQUIRED)    Answer:   eval lumpectomy nodule    Order Specific Question:   Preferred imaging location?    Answer:   External    Comments:   [facility]    Order Specific Question:   Is the patient pregnant?    Answer:   No  . US BREAST LTD UNI RIGHT INC AXILLA    Standing Status:   Future    Standing Expiration Date:   07/23/2018    Order Specific Question:   Reason for Exam (SYMPTOM  OR DIAGNOSIS REQUIRED)    Answer:   eval lumpectomy nodule    Order Specific Question:   Preferred imaging location?    Answer:   External    Comments:   [facility]    All questions were answered. The patient knows to call the clinic with any problems, questions or concerns. We can certainly see the patient much sooner if necessary.  A total of (30) minutes of face-to-face time was spent with this patient with greater than 50% of that time in counseling and  care-coordination.  This note was electronically signed. Scot Dock, NP 05/24/2017

## 2017-05-24 NOTE — Patient Instructions (Signed)
[facility] Discharge Instructions for Patients Receiving Chemotherapy  Today you received the following chemotherapy agents Taxol  To help prevent nausea and vomiting after your treatment, we encourage you to take your nausea medication as needed   If you develop nausea and vomiting that is not controlled by your nausea medication, call the clinic.   BELOW ARE SYMPTOMS THAT SHOULD BE REPORTED IMMEDIATELY:  *FEVER GREATER THAN 100.5 F  *CHILLS WITH OR WITHOUT FEVER  NAUSEA AND VOMITING THAT IS NOT CONTROLLED WITH YOUR NAUSEA MEDICATION  *UNUSUAL SHORTNESS OF BREATH  *UNUSUAL BRUISING OR BLEEDING  TENDERNESS IN MOUTH AND THROAT WITH OR WITHOUT PRESENCE OF ULCERS  *URINARY PROBLEMS  *BOWEL PROBLEMS  UNUSUAL RASH Items with * indicate a potential emergency and should be followed up as soon as possible.  Feel free to call the clinic should you have any questions or concerns. The clinic phone number is [phone].  Please show the [place] at check-in to the Emergency Department and triage nurse.

## 2017-05-24 NOTE — Telephone Encounter (Signed)
No 1/17 los -  

## 2017-05-24 NOTE — Assessment & Plan Note (Addendum)
12/28/2016: Right lumpectomy: IDC grade 2, 1.2 cm, broadly 0.1 cm to the posterior margin, 0/5 lymph nodes negative, ER 0%, PR 0%, HER-2 negative ratio 1.05, Ki-67 5%, T1 cN0 stage IB AJCC 8  Recommendation: 1. Adjuvant chemotherapy with dose dense Adriamycin and Cytoxan every 2 weeks 4 followed by weekly Taxol 12 2. followed by adjuvant radiation therapy ---------------------------------------------------------------------- Current treatment:Week 9 taxol Toxicities: Denies any nausea vomiting. Skin and nail changes on the fingers. Mild intermittent peripheral neuropathy: Taxol dose reduced, symptoms are improved. Breast pain and lump just below lumpectomy site: I think this is related to scar tissue, but we will get a right breast diagnostic mammogram and ultrasound to fully evaluate.  RTC weekly for chemo

## 2017-05-31 ENCOUNTER — Inpatient Hospital Stay: Payer: BLUE CROSS/BLUE SHIELD

## 2017-05-31 VITALS — BP 120/64 | HR 83 | Temp 98.5°F | Resp 17 | Wt 104.0 lb

## 2017-05-31 DIAGNOSIS — C50411 Malignant neoplasm of upper-outer quadrant of right female breast: Secondary | ICD-10-CM

## 2017-05-31 DIAGNOSIS — Z95828 Presence of other vascular implants and grafts: Secondary | ICD-10-CM

## 2017-05-31 DIAGNOSIS — Z170 Estrogen receptor positive status [ER+]: Principal | ICD-10-CM

## 2017-05-31 LAB — COMPREHENSIVE METABOLIC PANEL
ALT: 34 U/L (ref 0–55)
ANION GAP: 8 (ref 3–11)
AST: 26 U/L (ref 5–34)
Albumin: 3.9 g/dL (ref 3.5–5.0)
Alkaline Phosphatase: 79 U/L (ref 40–150)
BUN: 7 mg/dL (ref 7–26)
CHLORIDE: 106 mmol/L (ref 98–109)
CO2: 27 mmol/L (ref 22–29)
Calcium: 9.5 mg/dL (ref 8.4–10.4)
Creatinine, Ser: 0.67 mg/dL (ref 0.60–1.10)
GFR calc Af Amer: >60 mL/min (ref >60)
Glucose, Bld: 92 mg/dL (ref 70–140)
POTASSIUM: 3.8 mmol/L (ref 3.3–4.7)
Sodium: 141 mmol/L (ref 136–145)
Total Bilirubin: 0.2 mg/dL (ref 0.2–1.2)
Total Protein: 6.6 g/dL (ref 6.4–8.3)

## 2017-05-31 LAB — CBC WITH DIFFERENTIAL/PLATELET
BASOS ABS: 0.1 10*3/uL (ref 0.0–0.1)
Basophils Relative: 2 %
EOS PCT: 5 %
Eosinophils Absolute: 0.2 10*3/uL (ref 0.0–0.5)
HCT: 38.2 % (ref 34.8–46.6)
Hemoglobin: 12.7 g/dL (ref 11.6–15.9)
LYMPHS ABS: 0.9 10*3/uL (ref 0.9–3.3)
LYMPHS PCT: 26 %
MCH: 31.3 pg (ref 25.1–34.0)
MCHC: 33.2 g/dL (ref 31.5–36.0)
MCV: 94.3 fL (ref 79.5–101.0)
Monocytes Absolute: 0.3 10*3/uL (ref 0.1–0.9)
Monocytes Relative: 9 %
NEUTROS ABS: 2 10*3/uL (ref 1.5–6.5)
NEUTROS PCT: 58 %
PLATELETS: 234 10*3/uL (ref 145–400)
RBC: 4.05 MIL/uL (ref 3.70–5.45)
RDW: 14.9 % (ref 11.2–16.1)
WBC: 3.4 10*3/uL — AB (ref 3.9–10.3)

## 2017-05-31 MED ORDER — FAMOTIDINE IN NACL 20-0.9 MG/50ML-% IV SOLN
20.0000 mg | Freq: Once | INTRAVENOUS | Status: AC
  Administered 2017-05-31: 20 mg via INTRAVENOUS

## 2017-05-31 MED ORDER — DIPHENHYDRAMINE HCL 50 MG/ML IJ SOLN
INTRAMUSCULAR | Status: AC
Start: 2017-05-31 — End: ?
  Filled 2017-05-31: qty 1

## 2017-05-31 MED ORDER — SODIUM CHLORIDE 0.9 % IV SOLN
Freq: Once | INTRAVENOUS | Status: AC
  Administered 2017-05-31: 10:00:00 via INTRAVENOUS

## 2017-05-31 MED ORDER — SODIUM CHLORIDE 0.9 % IV SOLN
20.0000 mg | Freq: Once | INTRAVENOUS | Status: AC
  Administered 2017-05-31: 20 mg via INTRAVENOUS
  Filled 2017-05-31: qty 2

## 2017-05-31 MED ORDER — SODIUM CHLORIDE 0.9% FLUSH
10.0000 mL | INTRAVENOUS | Status: DC | PRN
  Administered 2017-05-31: 10 mL
  Filled 2017-05-31: qty 10

## 2017-05-31 MED ORDER — SODIUM CHLORIDE 0.9 % IV SOLN
65.0000 mg/m2 | Freq: Once | INTRAVENOUS | Status: AC
  Administered 2017-05-31: 90 mg via INTRAVENOUS
  Filled 2017-05-31: qty 15

## 2017-05-31 MED ORDER — HEPARIN SOD (PORK) LOCK FLUSH 100 UNIT/ML IV SOLN
500.0000 [IU] | Freq: Once | INTRAVENOUS | Status: AC | PRN
  Administered 2017-05-31: 500 [IU]
  Filled 2017-05-31: qty 5

## 2017-05-31 MED ORDER — FAMOTIDINE IN NACL 20-0.9 MG/50ML-% IV SOLN
INTRAVENOUS | Status: AC
Start: 2017-05-31 — End: ?
  Filled 2017-05-31: qty 50

## 2017-05-31 MED ORDER — DIPHENHYDRAMINE HCL 50 MG/ML IJ SOLN
25.0000 mg | Freq: Once | INTRAMUSCULAR | Status: AC
  Administered 2017-05-31: 25 mg via INTRAVENOUS

## 2017-05-31 MED ORDER — SODIUM CHLORIDE 0.9% FLUSH
10.0000 mL | INTRAVENOUS | Status: DC | PRN
  Administered 2017-05-31: 10 mL via INTRAVENOUS
  Filled 2017-05-31: qty 10

## 2017-05-31 NOTE — Patient Instructions (Signed)
Implanted Port Home Guide An implanted port is a type of central line that is placed under the skin. Central lines are used to provide IV access when treatment or nutrition needs to be given through a person's veins. Implanted ports are used for long-term IV access. An implanted port may be placed because:  You need IV medicine that would be irritating to the small veins in your hands or arms.  You need long-term IV medicines, such as antibiotics.  You need IV nutrition for a long period.  You need frequent blood draws for lab tests.  You need dialysis.  Implanted ports are usually placed in the chest area, but they can also be placed in the upper arm, the abdomen, or the leg. An implanted port has two main parts:  Reservoir. The reservoir is round and will appear as a small, raised area under your skin. The reservoir is the part where a needle is inserted to give medicines or draw blood.  Catheter. The catheter is a thin, flexible tube that extends from the reservoir. The catheter is placed into a large vein. Medicine that is inserted into the reservoir goes into the catheter and then into the vein.  How will I care for my incision site? Do not get the incision site wet. Bathe or shower as directed by your health care provider. How is my port accessed? Special steps must be taken to access the port:  Before the port is accessed, a numbing cream can be placed on the skin. This helps numb the skin over the port site.  Your health care provider uses a sterile technique to access the port. ? Your health care provider must put on a mask and sterile gloves. ? The skin over your port is cleaned carefully with an antiseptic and allowed to dry. ? The port is gently pinched between sterile gloves, and a needle is inserted into the port.  Only "non-coring" port needles should be used to access the port. Once the port is accessed, a blood return should be checked. This helps ensure that the port  is in the vein and is not clogged.  If your port needs to remain accessed for a constant infusion, a clear (transparent) bandage will be placed over the needle site. The bandage and needle will need to be changed every week, or as directed by your health care provider.  Keep the bandage covering the needle clean and dry. Do not get it wet. Follow your health care provider's instructions on how to take a shower or bath while the port is accessed.  If your port does not need to stay accessed, no bandage is needed over the port.  What is flushing? Flushing helps keep the port from getting clogged. Follow your health care provider's instructions on how and when to flush the port. Ports are usually flushed with saline solution or a medicine called heparin. The need for flushing will depend on how the port is used.  If the port is used for intermittent medicines or blood draws, the port will need to be flushed: ? After medicines have been given. ? After blood has been drawn. ? As part of routine maintenance.  If a constant infusion is running, the port may not need to be flushed.  How long will my port stay implanted? The port can stay in for as long as your health care provider thinks it is needed. When it is time for the port to come out, surgery will be   done to remove it. The procedure is similar to the one performed when the port was put in. When should I seek immediate medical care? When you have an implanted port, you should seek immediate medical care if:  You notice a bad smell coming from the incision site.  You have swelling, redness, or drainage at the incision site.  You have more swelling or pain at the port site or the surrounding area.  You have a fever that is not controlled with medicine.  This information is not intended to replace advice given to you by your health care provider. Make sure you discuss any questions you have with your health care provider. Document  Released: 04/23/2005 Document Revised: 09/29/2015 Document Reviewed: 12/29/2012 Elsevier Interactive Patient Education  2017 [employer]  

## 2017-05-31 NOTE — Patient Instructions (Addendum)
[facility] Discharge Instructions for Patients Receiving Chemotherapy  Today you received the following chemotherapy agents paclitaxel (Taxol)  To help prevent nausea and vomiting after your treatment, we encourage you to take your nausea medication as needed   If you develop nausea and vomiting that is not controlled by your nausea medication, call the clinic.   BELOW ARE SYMPTOMS THAT SHOULD BE REPORTED IMMEDIATELY:  *FEVER GREATER THAN 100.5 F  *CHILLS WITH OR WITHOUT FEVER  NAUSEA AND VOMITING THAT IS NOT CONTROLLED WITH YOUR NAUSEA MEDICATION  *UNUSUAL SHORTNESS OF BREATH  *UNUSUAL BRUISING OR BLEEDING  TENDERNESS IN MOUTH AND THROAT WITH OR WITHOUT PRESENCE OF ULCERS  *URINARY PROBLEMS  *BOWEL PROBLEMS  UNUSUAL RASH Items with * indicate a potential emergency and should be followed up as soon as possible.  Feel free to call the clinic should you have any questions or concerns. The clinic phone number is [phone].  Please show the [facility] at check-in to the Emergency Department and triage nurse.

## 2017-06-03 ENCOUNTER — Encounter: Payer: Self-pay | Admitting: *Deleted

## 2017-06-04 ENCOUNTER — Encounter: Payer: Self-pay | Admitting: Hematology and Oncology

## 2017-06-04 ENCOUNTER — Other Ambulatory Visit: Payer: Self-pay | Admitting: Radiology

## 2017-06-06 NOTE — Assessment & Plan Note (Signed)
12/28/2016: Right lumpectomy: IDC grade 2, 1.2 cm, broadly 0.1 cm to the posterior margin, 0/5 lymph nodes negative, ER 0%, PR 0%, HER-2 negative ratio 1.05, Ki-67 5%, T1 cN0 stage IB AJCC 8  Recommendation: 1. Adjuvant chemotherapy with dose dense Adriamycin and Cytoxan every 2 weeks 4 followed by weekly Taxol 12 2. followed by adjuvant radiation therapy ---------------------------------------------------------------------- Current treatment:Week 11 taxol Toxicities: Denies any nausea vomiting. Skin and nail changes on the fingers. Mild intermittent peripheral neuropathy: Taxol dose reduced, symptoms are improved. Breast pain and lump just below lumpectomy site: I think this is related to scar tissue, but we will get a right breast diagnostic mammogram and ultrasound to fully evaluate.  RTC weekly for chemo

## 2017-06-07 ENCOUNTER — Inpatient Hospital Stay (HOSPITAL_BASED_OUTPATIENT_CLINIC_OR_DEPARTMENT_OTHER): Payer: BLUE CROSS/BLUE SHIELD | Admitting: Hematology and Oncology

## 2017-06-07 ENCOUNTER — Inpatient Hospital Stay: Payer: BLUE CROSS/BLUE SHIELD | Attending: Hematology and Oncology

## 2017-06-07 ENCOUNTER — Inpatient Hospital Stay: Payer: BLUE CROSS/BLUE SHIELD

## 2017-06-07 DIAGNOSIS — C50411 Malignant neoplasm of upper-outer quadrant of right female breast: Secondary | ICD-10-CM

## 2017-06-07 DIAGNOSIS — Z95828 Presence of other vascular implants and grafts: Secondary | ICD-10-CM

## 2017-06-07 DIAGNOSIS — Z171 Estrogen receptor negative status [ER-]: Secondary | ICD-10-CM

## 2017-06-07 DIAGNOSIS — Z170 Estrogen receptor positive status [ER+]: Secondary | ICD-10-CM | POA: Diagnosis not present

## 2017-06-07 DIAGNOSIS — Z5111 Encounter for antineoplastic chemotherapy: Secondary | ICD-10-CM | POA: Diagnosis not present

## 2017-06-07 DIAGNOSIS — L609 Nail disorder, unspecified: Secondary | ICD-10-CM | POA: Diagnosis not present

## 2017-06-07 DIAGNOSIS — G620 Drug-induced polyneuropathy: Secondary | ICD-10-CM | POA: Diagnosis not present

## 2017-06-07 LAB — CBC WITH DIFFERENTIAL/PLATELET
Basophils Absolute: 0.1 10*3/uL (ref 0.0–0.1)
Basophils Relative: 3 %
EOS ABS: 0.2 10*3/uL (ref 0.0–0.5)
EOS PCT: 4 %
HCT: 37.3 % (ref 34.8–46.6)
HEMOGLOBIN: 12.3 g/dL (ref 11.6–15.9)
LYMPHS ABS: 0.8 10*3/uL — AB (ref 0.9–3.3)
LYMPHS PCT: 22 %
MCH: 31 pg (ref 25.1–34.0)
MCHC: 33.1 g/dL (ref 31.5–36.0)
MCV: 93.6 fL (ref 79.5–101.0)
MONOS PCT: 7 %
Monocytes Absolute: 0.3 10*3/uL (ref 0.1–0.9)
Neutro Abs: 2.1 10*3/uL (ref 1.5–6.5)
Neutrophils Relative %: 64 %
PLATELETS: 243 10*3/uL (ref 145–400)
RBC: 3.99 MIL/uL (ref 3.70–5.45)
RDW: 14.6 % — ABNORMAL HIGH (ref 11.2–14.5)
WBC: 3.4 10*3/uL — ABNORMAL LOW (ref 3.9–10.3)

## 2017-06-07 LAB — COMPREHENSIVE METABOLIC PANEL
ALBUMIN: 3.9 g/dL (ref 3.5–5.0)
ALT: 29 U/L (ref 0–55)
AST: 24 U/L (ref 5–34)
Alkaline Phosphatase: 71 U/L (ref 40–150)
Anion gap: 11 (ref 3–11)
BUN: 10 mg/dL (ref 7–26)
CHLORIDE: 108 mmol/L (ref 98–109)
CO2: 24 mmol/L (ref 22–29)
CREATININE: 0.73 mg/dL (ref 0.60–1.10)
Calcium: 9.3 mg/dL (ref 8.4–10.4)
GFR calc Af Amer: >60 mL/min (ref >60)
GFR calc non Af Amer: >60 mL/min (ref >60)
Glucose, Bld: 108 mg/dL (ref 70–140)
POTASSIUM: 3.5 mmol/L (ref 3.5–5.1)
SODIUM: 143 mmol/L (ref 136–145)
Total Bilirubin: 0.2 mg/dL (ref 0.2–1.2)
Total Protein: 6.5 g/dL (ref 6.4–8.3)

## 2017-06-07 MED ORDER — DIPHENHYDRAMINE HCL 50 MG/ML IJ SOLN
25.0000 mg | Freq: Once | INTRAMUSCULAR | Status: AC
  Administered 2017-06-07: 25 mg via INTRAVENOUS

## 2017-06-07 MED ORDER — FAMOTIDINE IN NACL 20-0.9 MG/50ML-% IV SOLN
20.0000 mg | Freq: Once | INTRAVENOUS | Status: AC
  Administered 2017-06-07: 20 mg via INTRAVENOUS

## 2017-06-07 MED ORDER — SODIUM CHLORIDE 0.9 % IV SOLN
Freq: Once | INTRAVENOUS | Status: AC
  Administered 2017-06-07: 10:00:00 via INTRAVENOUS

## 2017-06-07 MED ORDER — SODIUM CHLORIDE 0.9 % IV SOLN
20.0000 mg | Freq: Once | INTRAVENOUS | Status: AC
  Administered 2017-06-07: 20 mg via INTRAVENOUS
  Filled 2017-06-07: qty 2

## 2017-06-07 MED ORDER — HEPARIN SOD (PORK) LOCK FLUSH 100 UNIT/ML IV SOLN
500.0000 [IU] | Freq: Once | INTRAVENOUS | Status: AC | PRN
  Administered 2017-06-07: 500 [IU]
  Filled 2017-06-07: qty 5

## 2017-06-07 MED ORDER — SODIUM CHLORIDE 0.9% FLUSH
10.0000 mL | INTRAVENOUS | Status: DC | PRN
  Administered 2017-06-07: 10 mL
  Filled 2017-06-07: qty 10

## 2017-06-07 MED ORDER — FAMOTIDINE IN NACL 20-0.9 MG/50ML-% IV SOLN
INTRAVENOUS | Status: AC
  Filled 2017-06-07: qty 50

## 2017-06-07 MED ORDER — DIPHENHYDRAMINE HCL 50 MG/ML IJ SOLN
INTRAMUSCULAR | Status: AC
Start: 2017-06-07 — End: ?
  Filled 2017-06-07: qty 1

## 2017-06-07 MED ORDER — SODIUM CHLORIDE 0.9% FLUSH
10.0000 mL | INTRAVENOUS | Status: DC | PRN
  Administered 2017-06-07: 10 mL via INTRAVENOUS
  Filled 2017-06-07: qty 10

## 2017-06-07 MED ORDER — PACLITAXEL CHEMO INJECTION 300 MG/50ML
45.0000 mg/m2 | Freq: Once | INTRAVENOUS | Status: AC
  Administered 2017-06-07: 66 mg via INTRAVENOUS
  Filled 2017-06-07: qty 11

## 2017-06-07 NOTE — Progress Notes (Signed)
Patient Care Team: [name], MD as PCP - General (Family Medicine) Nicholas Lose, MD as Consulting Physician (Hematology and Oncology) [name], MD as Consulting Physician (General Surgery) Gery Pray, MD as Consulting Physician (Radiation Oncology)  DIAGNOSIS:  Encounter Diagnosis  Name Primary?  . Malignant neoplasm of upper-outer quadrant of right breast in female, estrogen receptor positive ([place])     SUMMARY OF ONCOLOGIC HISTORY:   Malignant neoplasm of upper-outer quadrant of right breast in female, estrogen receptor positive ([place])   12/05/2016 Initial Diagnosis    Screening detected calcifications Rt breast spanning 1.5 cm, U/S measured a lesion 1.2 cm size, Biopsy revealed IDC Grade 3, ER/PR Negative, Her 2 Neg Ratio: 1.05; Ki 67: 20%; T1bN0 Stage 1A Clinical stage      12/24/2016 Genetic Testing    The patient had genetic testing due to a personal and family history of breast cancer.  The [facility] Common Hereditary Cancer Panel + Melanoma panel was sent out. The Hereditary Gene Panel + Melanoma Panel offered by [facility] includes sequencing and/or deletion duplication testing of the following 53 genes: APC, ATM, AXIN2, BAP1, BARD1, BMPR1A, BRCA1, BRCA2, BRIP1, CDH1, CDK4, CDKN2A (p14ARF), CDKN2A (p16INK4a), CHEK2, CTNNA1, DICER1, EPCAM (Deletion/duplication testing only), GREM1 (promoter region deletion/duplication testing only), KIT, MC1R, MEN1, MLH1, MSH2, MSH3, MSH6, MUTYH, NBN, NF1, NHTL1, PALB2, PDGFRA, PMS2, POLD1, POLE, POT1, PTEN, RAD50, RAD51C, RAD51D, RB1, SDHB, SDHC, SDHD, SMAD4, SMARCA4. STK11, TERT, TP53, TSC1, TSC2, and VHL.  The following genes were evaluated for sequence changes only: MITF, SDHA and HOXB13 c.251G>A variant only.  Results: No pathogenic mutations identified. 3 VU'sS in BRCA1 c.2967T>A (p.Phe989Leu) , MSH6 c.2857G>A (p.Glu953Lys), and TERT c.902G>A (p.Arg301His) were identified.  The date of this test result is 12/24/2016.        12/28/2016  Surgery    Right lumpectomy: IDC grade 2, 1.2 cm, broadly 0.1 cm to the posterior margin, 0/5 lymph nodes negative, ER 0%, PR 0%, HER-2 negative ratio 1.05, Ki-67 5%, T1 cN0 stage IB AJCC 8      01/25/2017 -  Chemotherapy    Adjuvant chemotherapy with dose dense Adriamycin and Cytoxan 4 followed by Taxol weekly 12        CHIEF COMPLIANT: Cycle 11 Taxol  INTERVAL HISTORY: Lorraine Dean is a 52 year old with above-mentioned history of right breast cancer treated with lumpectomy and is currently on adjuvant chemotherapy.  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  She is very emotional and upset about this finding.  She does not have any nausea vomiting from chemo.  She does have neuropathy of the tips of the fingers.  She thinks that it is not severe or significant to stop treatment.  REVIEW OF SYSTEMS:   Constitutional: Denies fevers, chills or abnormal weight loss Eyes: Denies blurriness of vision Ears, nose, mouth, throat, and face: Denies mucositis or sore throat Respiratory: Denies cough, dyspnea or wheezes Cardiovascular: Denies palpitation, chest discomfort Gastrointestinal:  Denies nausea, heartburn or change in bowel habits Skin: Denies abnormal skin rashes Lymphatics: Denies new lymphadenopathy or easy bruising Neurological:Denies numbness, tingling or new weaknesses Behavioral/Psych: Mood is stable, no new changes  Extremities: No lower extremity edema Breast: 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  I have reviewed the past medical history, past surgical history, social history and family history with the patient and they are unchanged  from previous note.  ALLERGIES:  has No Known Allergies.  MEDICATIONS:  Current Outpatient Medications  Medication Sig Dispense Refill  . acetaminophen (TYLENOL) 500 MG tablet Take 500 mg by  mouth every 6 (six) hours as needed.    Marland Kitchen albuterol (PROVENTIL HFA;VENTOLIN HFA) 108 (90 Base) MCG/ACT inhaler Inhale 1-2 puffs into the lungs every 4 (four) hours as needed for wheezing or shortness of breath.    Marland Kitchen aspirin EC 81 MG tablet Take 81 mg by mouth daily.    Marland Kitchen azithromycin (ZITHROMAX) 250 MG tablet TAKE 2 TABLETS BY MOUTH TODAY, THEN TAKE 1 TABLET DAILY FOR 4 DAYS  0  . benzonatate (TESSALON) 100 MG capsule     . Biotin 1000 MCG tablet Take 1,000 mcg by mouth 3 (three) times daily.    . calcium-vitamin D (OSCAL WITH D) 500-200 MG-UNIT tablet Take 1 tablet by mouth.    . dexamethasone (DECADRON) 4 MG tablet Take 1 tablet (4 mg total) by mouth daily. Take 1 tablet day after chemotherapy and one more tablet the following day with food 8 tablet 0  . fluticasone (FLONASE) 50 MCG/ACT nasal spray Place 1 spray into both nostrils as needed for allergies or rhinitis.    [name] Oil 1000 MG CAPS Take by mouth.    . lidocaine-prilocaine (EMLA) cream Apply to affected area once 30 g 3  . LORazepam (ATIVAN) 0.5 MG tablet Take 1 tablet (0.5 mg total) by mouth at bedtime. 30 tablet 0  . mometasone (NASONEX) 50 MCG/ACT nasal spray 2 SPRAYS IN EACH NOSTRIL ONCE A DAY IF NEEDED NASALLY  2  . Multiple Vitamins-Minerals (OCUVITE ADULT FORMULA) CAPS Take by mouth.    . ondansetron (ZOFRAN) 8 MG tablet Take 1 tablet (8 mg total) by mouth 2 (two) times daily as needed. Start on the third day after chemotherapy. 30 tablet 1  . potassium chloride (K-DUR) 10 MEQ tablet Take 2 tablets (20 mEq total) by mouth 2 (two) times daily. 120 tablet 0  . prochlorperazine (COMPAZINE) 10 MG tablet Take 1 tablet (10 mg total) by mouth every 6 (six) hours as needed (Nausea or vomiting). 30 tablet 1   No current facility-administered medications for this visit.     PHYSICAL EXAMINATION: ECOG PERFORMANCE STATUS: 1 - Symptomatic but completely ambulatory  Vitals:   06/07/17 0819  BP: 120/61  Pulse: 92  Resp: 18  Temp:  98.8 F (37.1 C)  SpO2: 100%   Filed Weights   06/07/17 0819  Weight: 104 lb 1.6 oz (47.2 kg)    GENERAL:alert, no distress and comfortable SKIN: skin color, texture, turgor are normal, no rashes or significant lesions EYES: normal, Conjunctiva are pink and non-injected, sclera clear OROPHARYNX:no exudate, no erythema and lips, buccal mucosa, and tongue normal  NECK: supple, thyroid normal size, non-tender, without nodularity LYMPH:  no palpable lymphadenopathy in the cervical, axillary or inguinal LUNGS: clear to auscultation and percussion with normal breathing effort HEART: regular rate & rhythm and no murmurs and no lower extremity edema ABDOMEN:abdomen soft, non-tender and normal bowel sounds MUSCULOSKELETAL:no cyanosis of digits and no clubbing  NEURO: alert & oriented x 3 with fluent speech, no focal motor/sensory deficits EXTREMITIES: No lower extremity edema BREAST: No palpable masses or nodules in either right or left breasts. No palpable axillary supraclavicular or infraclavicular adenopathy no breast tenderness or nipple discharge. (exam performed in the presence of a chaperone)  LABORATORY DATA:  I have reviewed the data as listed CMP Latest Ref Rng &  Units 06/07/2017 05/31/2017 05/24/2017  Glucose 70 - 140 mg/dL 108 92 107  BUN 7 - 26 mg/dL '10 7 10  '$ Creatinine 0.60 - 1.10 mg/dL 0.73 0.67 0.70  Sodium 136 - 145 mmol/L 143 141 140  Potassium 3.5 - 5.1 mmol/L 3.5 3.8 3.6  Chloride 98 - 109 mmol/L 108 106 105  CO2 22 - 29 mmol/L '24 27 25  '$ Calcium 8.4 - 10.4 mg/dL 9.3 9.5 9.4  Total Protein 6.4 - 8.3 g/dL 6.5 6.6 6.8  Total Bilirubin 0.2 - 1.2 mg/dL 0.2 0.2 0.3  Alkaline Phos 40 - 150 U/L 71 79 90  AST 5 - 34 U/L '24 26 28  '$ ALT 0 - 55 U/L 29 34 45    Lab Results  Component Value Date   WBC 3.4 (L) 06/07/2017   HGB 12.3 06/07/2017   HCT 37.3 06/07/2017   MCV 93.6 06/07/2017   PLT 243 06/07/2017   NEUTROABS 2.1 06/07/2017    ASSESSMENT & PLAN:  Malignant neoplasm  of upper-outer quadrant of right breast in female, estrogen receptor positive ([place]) 12/28/2016: Right lumpectomy: IDC grade 2, 1.2 cm, broadly 0.1 cm to the posterior margin, 0/5 lymph nodes negative, ER 0%, PR 0%, HER-2 negative ratio 1.05, Ki-67 5%, T1 cN0 stage IB AJCC 8  Recommendation: 1. Adjuvant chemotherapy with dose dense Adriamycin and Cytoxan every 2 weeks 4 followed by weekly Taxol 12 2. followed by adjuvant radiation therapy ---------------------------------------------------------------------- Current treatment:Week 11 taxol Toxicities: Denies any nausea vomiting. Skin and nail changes on the fingers. Moderate peripheral neuropathy: Taxol dose for the reduced.  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 I recommended that she see surgery to undergo resection of DCIS after chemotherapy is complete. She will still need radiation therapy afterwards. I reassured her that it is not invasive breast cancer and that chemo is not meant to treat DCIS anyway.  RTC weekly for chemo     I spent 25 minutes talking to the patient of which more than half was spent in counseling and coordination of care.  No orders of the defined types were placed in this encounter.  The patient has a good understanding of the overall plan. she agrees with it. she will call with any problems that may develop before the next visit here.   Harriette Ohara, MD 06/07/17

## 2017-06-07 NOTE — Patient Instructions (Signed)
[facility] Discharge Instructions for Patients Receiving Chemotherapy  Today you received the following chemotherapy agents paclitaxel (Taxol)  To help prevent nausea and vomiting after your treatment, we encourage you to take your nausea medication as needed   If you develop nausea and vomiting that is not controlled by your nausea medication, call the clinic.   BELOW ARE SYMPTOMS THAT SHOULD BE REPORTED IMMEDIATELY:  *FEVER GREATER THAN 100.5 F  *CHILLS WITH OR WITHOUT FEVER  NAUSEA AND VOMITING THAT IS NOT CONTROLLED WITH YOUR NAUSEA MEDICATION  *UNUSUAL SHORTNESS OF BREATH  *UNUSUAL BRUISING OR BLEEDING  TENDERNESS IN MOUTH AND THROAT WITH OR WITHOUT PRESENCE OF ULCERS  *URINARY PROBLEMS  *BOWEL PROBLEMS  UNUSUAL RASH Items with * indicate a potential emergency and should be followed up as soon as possible.  Feel free to call the clinic should you have any questions or concerns. The clinic phone number is [phone].  Please show the [facility] at check-in to the Emergency Department and triage nurse.

## 2017-06-10 ENCOUNTER — Telehealth: Payer: Self-pay

## 2017-06-10 NOTE — Telephone Encounter (Signed)
Pt called and wanted to know that she does not have an appt with Dr.Gudena for her last chemo treatment and would like to get one to discuss what she will need to do after last chemo.   Told pt that she can see Mendel Ryder, NP for her last treatment. Pt agreeable to this. Confirmed time/date of appt. No further needs at this time.

## 2017-06-13 NOTE — Progress Notes (Signed)
[facility] Cancer Follow up:    Morrow, Aaron, MD [address] [place] [address]   DIAGNOSIS: Cancer Staging Malignant neoplasm of upper-outer quadrant of right breast in female, estrogen receptor positive (HCC) Staging form: Breast, AJCC 8th Edition - Clinical stage from 12/12/2016: Stage IB (cT1c, cN0, cM0, G3, ER: Negative, PR: Negative, HER2: Negative) - Unsigned   SUMMARY OF ONCOLOGIC HISTORY:   Malignant neoplasm of upper-outer quadrant of right breast in female, estrogen receptor positive (HCC)   12/05/2016 Initial Diagnosis    Screening detected calcifications Rt breast spanning 1.5 cm, U/S measured a lesion 1.2 cm size, Biopsy revealed IDC Grade 3, ER/PR Negative, Her 2 Neg Ratio: 1.05; Ki 67: 20%; T1bN0 Stage 1A Clinical stage      12/24/2016 Genetic Testing    The patient had genetic testing due to a personal and family history of breast cancer.  The [facility] Common Hereditary Cancer Panel + Melanoma panel was sent out. The Hereditary Gene Panel + Melanoma Panel offered by [facility] includes sequencing and/or deletion duplication testing of the following 53 genes: APC, ATM, AXIN2, BAP1, BARD1, BMPR1A, BRCA1, BRCA2, BRIP1, CDH1, CDK4, CDKN2A (p14ARF), CDKN2A (p16INK4a), CHEK2, CTNNA1, DICER1, EPCAM (Deletion/duplication testing only), GREM1 (promoter region deletion/duplication testing only), KIT, MC1R, MEN1, MLH1, MSH2, MSH3, MSH6, MUTYH, NBN, NF1, NHTL1, PALB2, PDGFRA, PMS2, POLD1, POLE, POT1, PTEN, RAD50, RAD51C, RAD51D, RB1, SDHB, SDHC, SDHD, SMAD4, SMARCA4. STK11, TERT, TP53, TSC1, TSC2, and VHL.  The following genes were evaluated for sequence changes only: MITF, SDHA and HOXB13 c.251G>A variant only.  Results: No pathogenic mutations identified. 3 VU'sS in BRCA1 c.2967T>A (p.Phe989Leu) , MSH6 c.2857G>A (p.Glu953Lys), and TERT c.902G>A (p.Arg301His) were identified.  The date of this test result is 12/24/2016.        12/28/2016 Surgery    Right  lumpectomy: IDC grade 2, 1.2 cm, broadly 0.1 cm to the posterior margin, 0/5 lymph nodes negative, ER 0%, PR 0%, HER-2 negative ratio 1.05, Ki-67 5%, T1 cN0 stage IB AJCC 8      01/25/2017 -  Chemotherapy    Adjuvant chemotherapy with dose dense Adriamycin and Cytoxan 4 followed by Taxol weekly 12        CURRENT THERAPY: Taxol weekly  INTERVAL HISTORY: Lorraine Dean 52 y.o. female returns for evlaution prior to receiving Taxol.  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  She is concerned.  She is still trying to come to terms with the fact that she has DCIS in her right breast now too.  She has been thinking more about her results and would like to see Dr. Thimmappa in plastic surgery about reconstruction.  She would like to keep her breast, but does want to know more about reconstruction if she needs a mastectomy, or decides on a mastectomy.  Today is her last chemotherapy.  She has tolerated it well.     Patient Active Problem List   Diagnosis Date Noted  . Port catheter in place 01/25/2017  . Preoperative clearance 12/24/2016  . Palpitations 12/24/2016  . Hyperlipidemia 12/24/2016  . Family history of early CAD 12/24/2016  . Genetic testing 12/20/2016  . Family history of breast cancer 12/13/2016  . Family history of lung cancer 12/13/2016  . Malignant neoplasm of upper-outer quadrant of right breast in female, estrogen receptor positive (HCC) 12/11/2016    has No Known Allergies.  MEDICAL HISTORY: Past Medical History:  Diagnosis Date  . Anxiety   . Cancer (  HCC)    right breast cancer  . Chronic rhinitis   . Depression   . Family history of breast cancer 12/13/2016  . Family history of lung cancer 12/13/2016  . High cholesterol   . HPV in female   . Malignant neoplasm of upper-outer quadrant of right breast in female, estrogen receptor positive (HCC) 12/11/2016  . PONV (postoperative nausea and vomiting)     SURGICAL HISTORY: Past Surgical  History:  Procedure Laterality Date  . BREAST BIOPSY Right 2018   triple negative IDC grade 3 tumor  . BREAST LUMPECTOMY WITH RADIOACTIVE SEED AND SENTINEL LYMPH NODE BIOPSY Right 12/28/2016   Procedure: RIGHT BREAST LUMPECTOMY WITH RADIOACTIVE SEED AND SENTINEL LYMPH NODE MAPPING ERAS PATHWAY;  Surgeon: Toth, Paul III, MD;  Location: [facility];  Service: General;  Laterality: Right;  PEC BLOCK  . PORTACATH PLACEMENT Left 01/18/2017   Procedure: INSERTION PORT-A-CATH;  Surgeon: Toth, Paul III, MD;  Location: [facility];  Service: General;  Laterality: Left;  . WISDOM TOOTH EXTRACTION      SOCIAL HISTORY: Social History   Socioeconomic History  . Marital status: Single    Spouse name: Not on file  . Number of children: Not on file  . Years of education: Not on file  . Highest education level: Not on file  Social Needs  . Financial resource strain: Not on file  . Food insecurity - worry: Not on file  . Food insecurity - inability: Not on file  . Transportation needs - medical: Not on file  . Transportation needs - non-medical: Not on file  Occupational History  . Not on file  Tobacco Use  . Smoking status: Never Smoker  . Smokeless tobacco: Never Used  Substance and Sexual Activity  . Alcohol use: Yes    Comment: social  . Drug use: No  . Sexual activity: Yes    Birth control/protection: None    Comment: Intercourse 1 month ago was on the pill,then stoppedl  Other Topics Concern  . Not on file  Social History Narrative  . Not on file    FAMILY HISTORY: Family History  Problem Relation Age of Onset  . Heart disease Father 60       CABG  . Breast cancer Maternal Aunt 60  . Lung cancer Maternal Aunt        60's. unsure if it was a primary LG or a met from breast recurrence.  She had a brain met as well.  She died at 70.  . Lung cancer Paternal Aunt        dx. late 50's, died in 60's.  . Heart disease Paternal Grandmother   . Heart disease  Paternal Grandfather     Review of Systems  Constitutional: Positive for fatigue. Negative for appetite change, chills, fever and unexpected weight change.  HENT:   Negative for hearing loss and lump/mass.   Eyes: Negative for eye problems and icterus.  Respiratory: Negative for chest tightness, cough and shortness of breath.   Cardiovascular: Negative for chest pain, leg swelling and palpitations.  Gastrointestinal: Negative for abdominal distention, abdominal pain, constipation, diarrhea, nausea and vomiting.  Endocrine: Negative for hot flashes.  Musculoskeletal: Negative for arthralgias.  Skin: Negative for itching and rash.  Neurological: Negative for dizziness, extremity weakness and numbness.  Hematological: Negative for adenopathy. Does not bruise/bleed easily.  Psychiatric/Behavioral: Negative for depression. The patient is not nervous/anxious.       PHYSICAL EXAMINATION  ECOG   PERFORMANCE STATUS: 1 - Symptomatic but completely ambulatory  Vitals:   06/14/17 0853  BP: 122/67  Pulse: 81  Resp: 18  Temp: 98.3 F (36.8 C)  SpO2: 100%    Physical Exam  Constitutional: She is oriented to person, place, and time and well-developed, well-nourished, and in no distress.  HENT:  Head: Normocephalic and atraumatic.  Mouth/Throat: Oropharynx is clear and moist. No oropharyngeal exudate.  Eyes: Pupils are equal, round, and reactive to light. No scleral icterus.  Neck: Neck supple.  Cardiovascular: Normal rate, regular rhythm and normal heart sounds.  Pulmonary/Chest: Effort normal and breath sounds normal. No respiratory distress. She has no wheezes. She has no rales.  Abdominal: Soft. Bowel sounds are normal. She exhibits no distension and no mass. There is no tenderness. There is no rebound and no guarding.  Musculoskeletal: She exhibits no edema.  Lymphadenopathy:    She has no cervical adenopathy.  Neurological: She is alert and oriented to person, place, and time. No  cranial nerve deficit.  Skin: Skin is warm and dry.  Psychiatric: Mood and affect normal.    LABORATORY DATA:  CBC    Component Value Date/Time   WBC 3.7 (L) 06/14/2017 0821   RBC 4.13 06/14/2017 0821   HGB 12.7 06/14/2017 0821   HGB 12.1 05/10/2017 0811   HCT 38.2 06/14/2017 0821   HCT 36.5 05/10/2017 0811   PLT 240 06/14/2017 0821   PLT 272 05/10/2017 0811   MCV 92.5 06/14/2017 0821   MCV 95.1 05/10/2017 0811   MCH 30.6 06/14/2017 0821   MCHC 33.1 06/14/2017 0821   RDW 14.6 (H) 06/14/2017 0821   RDW 14.5 05/10/2017 0811   LYMPHSABS 0.9 06/14/2017 0821   LYMPHSABS 0.9 05/10/2017 0811   MONOABS 0.3 06/14/2017 0821   MONOABS 0.3 05/10/2017 0811   EOSABS 0.2 06/14/2017 0821   EOSABS 0.3 05/10/2017 0811   BASOSABS 0.1 06/14/2017 0821   BASOSABS 0.1 05/10/2017 0811    CMP     Component Value Date/Time   NA 142 06/14/2017 0821   NA 135 (L) 05/10/2017 0811   K 3.7 06/14/2017 0821   K 3.9 05/10/2017 0811   CL 107 06/14/2017 0821   CO2 25 06/14/2017 0821   CO2 24 05/10/2017 0811   GLUCOSE 85 06/14/2017 0821   GLUCOSE 106 05/10/2017 0811   BUN 8 06/14/2017 0821   BUN 9.8 05/10/2017 0811   CREATININE 0.64 06/14/2017 0821   CREATININE 0.7 05/10/2017 0811   CALCIUM 9.3 06/14/2017 0821   CALCIUM 9.3 05/10/2017 0811   PROT 6.4 06/14/2017 0821   PROT 6.6 05/10/2017 0811   ALBUMIN 3.9 06/14/2017 0821   ALBUMIN 3.7 05/10/2017 0811   AST 23 06/14/2017 0821   AST 30 05/10/2017 0811   ALT 25 06/14/2017 0821   ALT 47 05/10/2017 0811   ALKPHOS 70 06/14/2017 0821   ALKPHOS 115 05/10/2017 0811   BILITOT 0.2 06/14/2017 0821   BILITOT 0.28 05/10/2017 0811   GFRNONAA >60 06/14/2017 0821   GFRAA >60 06/14/2017 0821         ASSESSMENT and THERAPY PLAN:   Malignant neoplasm of upper-outer quadrant of right breast in female, estrogen receptor positive (HCC) 12/28/2016: Right lumpectomy: IDC grade 2, 1.2 cm, broadly 0.1 cm to the posterior margin, 0/5 lymph nodes negative,  ER 0%, PR 0%, HER-2 negative ratio 1.05, Ki-67 5%, T1 cN0 stage IB AJCC 8  Recommendation: 1. Adjuvant chemotherapy with dose dense Adriamycin and Cytoxan every 2 weeks 4   followed by weekly Taxol 12 2. followed by adjuvant radiation therapy ---------------------------------------------------------------------- Current treatment:Week 12 taxol Toxicities: Denies any nausea vomiting. Skin and nail changes on the fingers. Mild intermittent peripheral neuropathy: Taxol dose reduced, symptoms are improved.  We reviewed her new diagnosis.  I reviewed the pathology results with her, and that this DCIS is estrogen positive.  She continues to think about surgery.  Keisha scheduled her an appointment with Dr. Thimmappa on Monday morning, 06/17/17.  Tuwanna will need to talk to Dr. Toth and decide on what surgery she wants.  We will see her after surgery to review results.        All questions were answered. The patient knows to call the clinic with any problems, questions or concerns. We can certainly see the patient much sooner if necessary.  A total of (30) minutes of face-to-face time was spent with this patient with greater than 50% of that time in counseling and care-coordination.  This note was electronically signed.  C , NP 06/14/2017 

## 2017-06-13 NOTE — Assessment & Plan Note (Addendum)
12/28/2016: Right lumpectomy: IDC grade 2, 1.2 cm, broadly 0.1 cm to the posterior margin, 0/5 lymph nodes negative, ER 0%, PR 0%, HER-2 negative ratio 1.05, Ki-67 5%, T1 cN0 stage IB AJCC 8  Recommendation: 1. Adjuvant chemotherapy with dose dense Adriamycin and Cytoxan every 2 weeks 4 followed by weekly Taxol 12 2. followed by adjuvant radiation therapy ---------------------------------------------------------------------- Current treatment:Week 12 taxol Toxicities: Denies any nausea vomiting. Skin and nail changes on the fingers. Mild intermittent peripheral neuropathy: Taxol dose reduced, symptoms are improved.  We reviewed her new diagnosis.  I reviewed the pathology results with her, and that this DCIS is estrogen positive.  She continues to think about surgery.  Lorraine Dean scheduled her an appointment with Dr. Thimmappa on Monday morning, 06/17/17.  Lorraine Dean will need to talk to Dr. Toth and decide on what surgery she wants.  We will see her after surgery to review results.      

## 2017-06-14 ENCOUNTER — Ambulatory Visit: Payer: BLUE CROSS/BLUE SHIELD

## 2017-06-14 ENCOUNTER — Inpatient Hospital Stay: Payer: BLUE CROSS/BLUE SHIELD

## 2017-06-14 ENCOUNTER — Encounter: Payer: Self-pay | Admitting: Adult Health

## 2017-06-14 ENCOUNTER — Encounter: Payer: Self-pay | Admitting: *Deleted

## 2017-06-14 ENCOUNTER — Inpatient Hospital Stay (HOSPITAL_BASED_OUTPATIENT_CLINIC_OR_DEPARTMENT_OTHER): Payer: BLUE CROSS/BLUE SHIELD | Admitting: Adult Health

## 2017-06-14 VITALS — BP 122/67 | HR 81 | Temp 98.3°F | Resp 18 | Ht 60.0 in | Wt 105.1 lb

## 2017-06-14 DIAGNOSIS — C50411 Malignant neoplasm of upper-outer quadrant of right female breast: Secondary | ICD-10-CM | POA: Diagnosis not present

## 2017-06-14 DIAGNOSIS — Z170 Estrogen receptor positive status [ER+]: Secondary | ICD-10-CM | POA: Diagnosis not present

## 2017-06-14 DIAGNOSIS — Z95828 Presence of other vascular implants and grafts: Secondary | ICD-10-CM

## 2017-06-14 LAB — CBC WITH DIFFERENTIAL/PLATELET
Basophils Absolute: 0.1 10*3/uL (ref 0.0–0.1)
Basophils Relative: 2 %
EOS PCT: 4 %
Eosinophils Absolute: 0.2 10*3/uL (ref 0.0–0.5)
HEMATOCRIT: 38.2 % (ref 34.8–46.6)
Hemoglobin: 12.7 g/dL (ref 11.6–15.9)
LYMPHS ABS: 0.9 10*3/uL (ref 0.9–3.3)
Lymphocytes Relative: 24 %
MCH: 30.6 pg (ref 25.1–34.0)
MCHC: 33.1 g/dL (ref 31.5–36.0)
MCV: 92.5 fL (ref 79.5–101.0)
MONOS PCT: 7 %
Monocytes Absolute: 0.3 10*3/uL (ref 0.1–0.9)
NEUTROS ABS: 2.3 10*3/uL (ref 1.5–6.5)
Neutrophils Relative %: 63 %
PLATELETS: 240 10*3/uL (ref 145–400)
RBC: 4.13 MIL/uL (ref 3.70–5.45)
RDW: 14.6 % — AB (ref 11.2–14.5)
WBC: 3.7 10*3/uL — ABNORMAL LOW (ref 3.9–10.3)

## 2017-06-14 LAB — COMPREHENSIVE METABOLIC PANEL
ALBUMIN: 3.9 g/dL (ref 3.5–5.0)
ALT: 25 U/L (ref 0–55)
ANION GAP: 10 (ref 3–11)
AST: 23 U/L (ref 5–34)
Alkaline Phosphatase: 70 U/L (ref 40–150)
BILIRUBIN TOTAL: 0.2 mg/dL (ref 0.2–1.2)
BUN: 8 mg/dL (ref 7–26)
CHLORIDE: 107 mmol/L (ref 98–109)
CO2: 25 mmol/L (ref 22–29)
Calcium: 9.3 mg/dL (ref 8.4–10.4)
Creatinine, Ser: 0.64 mg/dL (ref 0.60–1.10)
GFR calc Af Amer: >60 mL/min (ref >60)
GFR calc non Af Amer: >60 mL/min (ref >60)
GLUCOSE: 85 mg/dL (ref 70–140)
POTASSIUM: 3.7 mmol/L (ref 3.5–5.1)
SODIUM: 142 mmol/L (ref 136–145)
TOTAL PROTEIN: 6.4 g/dL (ref 6.4–8.3)

## 2017-06-14 MED ORDER — SODIUM CHLORIDE 0.9% FLUSH
10.0000 mL | INTRAVENOUS | Status: DC | PRN
  Administered 2017-06-14: 10 mL
  Filled 2017-06-14: qty 10

## 2017-06-14 MED ORDER — SODIUM CHLORIDE 0.9 % IV SOLN
45.0000 mg/m2 | Freq: Once | INTRAVENOUS | Status: AC
  Administered 2017-06-14: 66 mg via INTRAVENOUS
  Filled 2017-06-14: qty 11

## 2017-06-14 MED ORDER — DIPHENHYDRAMINE HCL 50 MG/ML IJ SOLN
INTRAMUSCULAR | Status: AC
  Filled 2017-06-14: qty 1

## 2017-06-14 MED ORDER — DIPHENHYDRAMINE HCL 50 MG/ML IJ SOLN
25.0000 mg | Freq: Once | INTRAMUSCULAR | Status: AC
  Administered 2017-06-14: 25 mg via INTRAVENOUS

## 2017-06-14 MED ORDER — HEPARIN SOD (PORK) LOCK FLUSH 100 UNIT/ML IV SOLN
500.0000 [IU] | Freq: Once | INTRAVENOUS | Status: AC | PRN
  Administered 2017-06-14: 500 [IU]
  Filled 2017-06-14: qty 5

## 2017-06-14 MED ORDER — SODIUM CHLORIDE 0.9% FLUSH
10.0000 mL | INTRAVENOUS | Status: DC | PRN
  Administered 2017-06-14: 10 mL via INTRAVENOUS
  Filled 2017-06-14: qty 10

## 2017-06-14 MED ORDER — SODIUM CHLORIDE 0.9 % IV SOLN
20.0000 mg | Freq: Once | INTRAVENOUS | Status: AC
  Administered 2017-06-14: 20 mg via INTRAVENOUS
  Filled 2017-06-14: qty 2

## 2017-06-14 MED ORDER — FAMOTIDINE IN NACL 20-0.9 MG/50ML-% IV SOLN
20.0000 mg | Freq: Once | INTRAVENOUS | Status: AC
  Administered 2017-06-14: 20 mg via INTRAVENOUS

## 2017-06-14 MED ORDER — FAMOTIDINE IN NACL 20-0.9 MG/50ML-% IV SOLN
INTRAVENOUS | Status: AC
  Filled 2017-06-14: qty 50

## 2017-06-14 MED ORDER — SODIUM CHLORIDE 0.9 % IV SOLN
Freq: Once | INTRAVENOUS | Status: AC
  Administered 2017-06-14: 10:00:00 via INTRAVENOUS

## 2017-06-14 NOTE — Patient Instructions (Signed)
Implanted Port Home Guide An implanted port is a type of central line that is placed under the skin. Central lines are used to provide IV access when treatment or nutrition needs to be given through a person's veins. Implanted ports are used for long-term IV access. An implanted port may be placed because:  You need IV medicine that would be irritating to the small veins in your hands or arms.  You need long-term IV medicines, such as antibiotics.  You need IV nutrition for a long period.  You need frequent blood draws for lab tests.  You need dialysis.  Implanted ports are usually placed in the chest area, but they can also be placed in the upper arm, the abdomen, or the leg. An implanted port has two main parts:  Reservoir. The reservoir is round and will appear as a small, raised area under your skin. The reservoir is the part where a needle is inserted to give medicines or draw blood.  Catheter. The catheter is a thin, flexible tube that extends from the reservoir. The catheter is placed into a large vein. Medicine that is inserted into the reservoir goes into the catheter and then into the vein.  How will I care for my incision site? Do not get the incision site wet. Bathe or shower as directed by your health care provider. How is my port accessed? Special steps must be taken to access the port:  Before the port is accessed, a numbing cream can be placed on the skin. This helps numb the skin over the port site.  Your health care provider uses a sterile technique to access the port. ? Your health care provider must put on a mask and sterile gloves. ? The skin over your port is cleaned carefully with an antiseptic and allowed to dry. ? The port is gently pinched between sterile gloves, and a needle is inserted into the port.  Only "non-coring" port needles should be used to access the port. Once the port is accessed, a blood return should be checked. This helps ensure that the port  is in the vein and is not clogged.  If your port needs to remain accessed for a constant infusion, a clear (transparent) bandage will be placed over the needle site. The bandage and needle will need to be changed every week, or as directed by your health care provider.  Keep the bandage covering the needle clean and dry. Do not get it wet. Follow your health care provider's instructions on how to take a shower or bath while the port is accessed.  If your port does not need to stay accessed, no bandage is needed over the port.  What is flushing? Flushing helps keep the port from getting clogged. Follow your health care provider's instructions on how and when to flush the port. Ports are usually flushed with saline solution or a medicine called heparin. The need for flushing will depend on how the port is used.  If the port is used for intermittent medicines or blood draws, the port will need to be flushed: ? After medicines have been given. ? After blood has been drawn. ? As part of routine maintenance.  If a constant infusion is running, the port may not need to be flushed.  How long will my port stay implanted? The port can stay in for as long as your health care provider thinks it is needed. When it is time for the port to come out, surgery will be   done to remove it. The procedure is similar to the one performed when the port was put in. When should I seek immediate medical care? When you have an implanted port, you should seek immediate medical care if:  You notice a bad smell coming from the incision site.  You have swelling, redness, or drainage at the incision site.  You have more swelling or pain at the port site or the surrounding area.  You have a fever that is not controlled with medicine.  This information is not intended to replace advice given to you by your health care provider. Make sure you discuss any questions you have with your health care provider. Document  Released: 04/23/2005 Document Revised: 09/29/2015 Document Reviewed: 12/29/2012 Elsevier Interactive Patient Education  2017 [employer]  

## 2017-06-14 NOTE — Patient Instructions (Signed)
[facility] Discharge Instructions for Patients Receiving Chemotherapy  Today you received the following chemotherapy agents paclitaxel (Taxol)  To help prevent nausea and vomiting after your treatment, we encourage you to take your nausea medication as needed   If you develop nausea and vomiting that is not controlled by your nausea medication, call the clinic.   BELOW ARE SYMPTOMS THAT SHOULD BE REPORTED IMMEDIATELY:  *FEVER GREATER THAN 100.5 F  *CHILLS WITH OR WITHOUT FEVER  NAUSEA AND VOMITING THAT IS NOT CONTROLLED WITH YOUR NAUSEA MEDICATION  *UNUSUAL SHORTNESS OF BREATH  *UNUSUAL BRUISING OR BLEEDING  TENDERNESS IN MOUTH AND THROAT WITH OR WITHOUT PRESENCE OF ULCERS  *URINARY PROBLEMS  *BOWEL PROBLEMS  UNUSUAL RASH Items with * indicate a potential emergency and should be followed up as soon as possible.  Feel free to call the clinic should you have any questions or concerns. The clinic phone number is [phone].  Please show the [place] at check-in to the Emergency Department and triage nurse.

## 2017-06-18 ENCOUNTER — Telehealth: Payer: Self-pay

## 2017-06-18 ENCOUNTER — Ambulatory Visit: Payer: Self-pay | Admitting: General Surgery

## 2017-06-18 DIAGNOSIS — D0511 Intraductal carcinoma in situ of right breast: Secondary | ICD-10-CM

## 2017-06-18 NOTE — Telephone Encounter (Signed)
Returned pt call and informed her she can schedule surgery three weeks out from her last chemotherapy treatment and that Dr. Lindi Adie will see her after surgery. No further questions at this time.  Cyndia Bent RN

## 2017-06-20 ENCOUNTER — Telehealth: Payer: Self-pay | Admitting: Medical Oncology

## 2017-06-20 NOTE — Telephone Encounter (Signed)
Jonay wants to get an appointment to talk to [facility] because she has lots of questions about her surgery .

## 2017-06-21 ENCOUNTER — Telehealth: Payer: Self-pay

## 2017-06-21 NOTE — Telephone Encounter (Signed)
I will call patient and set her up for an appt.

## 2017-06-21 NOTE — Telephone Encounter (Signed)
Called pt and LVM regarding an appt with Dr.Gudena. Pt set up for next Monday 2/18 at 815am. Left msg to have pt call back to confirm receipt of vm and appt.

## 2017-06-24 ENCOUNTER — Inpatient Hospital Stay (HOSPITAL_BASED_OUTPATIENT_CLINIC_OR_DEPARTMENT_OTHER): Payer: BLUE CROSS/BLUE SHIELD | Admitting: Hematology and Oncology

## 2017-06-24 DIAGNOSIS — Z170 Estrogen receptor positive status [ER+]: Secondary | ICD-10-CM | POA: Diagnosis not present

## 2017-06-24 DIAGNOSIS — C50411 Malignant neoplasm of upper-outer quadrant of right female breast: Secondary | ICD-10-CM

## 2017-06-24 NOTE — Progress Notes (Signed)
Patient Care Team: [name], MD as PCP - General (Family Medicine) Nicholas Lose, MD as Consulting Physician (Hematology and Oncology) [name], MD as Consulting Physician (General Surgery) Gery Pray, MD as Consulting Physician (Radiation Oncology)  DIAGNOSIS:  Encounter Diagnosis  Name Primary?  . Malignant neoplasm of upper-outer quadrant of right breast in female, estrogen receptor positive ([place])     SUMMARY OF ONCOLOGIC HISTORY:   Malignant neoplasm of upper-outer quadrant of right breast in female, estrogen receptor positive ([place])   12/05/2016 Initial Diagnosis    Screening detected calcifications Rt breast spanning 1.5 cm, U/S measured a lesion 1.2 cm size, Biopsy revealed IDC Grade 3, ER/PR Negative, Her 2 Neg Ratio: 1.05; Ki 67: 20%; T1bN0 Stage 1A Clinical stage      12/24/2016 Genetic Testing    The patient had genetic testing due to a personal and family history of breast cancer.  The [facility] Common Hereditary Cancer Panel + Melanoma panel was sent out. The Hereditary Gene Panel + Melanoma Panel offered by [facility] includes sequencing and/or deletion duplication testing of the following 53 genes: APC, ATM, AXIN2, BAP1, BARD1, BMPR1A, BRCA1, BRCA2, BRIP1, CDH1, CDK4, CDKN2A (p14ARF), CDKN2A (p16INK4a), CHEK2, CTNNA1, DICER1, EPCAM (Deletion/duplication testing only), GREM1 (promoter region deletion/duplication testing only), KIT, MC1R, MEN1, MLH1, MSH2, MSH3, MSH6, MUTYH, NBN, NF1, NHTL1, PALB2, PDGFRA, PMS2, POLD1, POLE, POT1, PTEN, RAD50, RAD51C, RAD51D, RB1, SDHB, SDHC, SDHD, SMAD4, SMARCA4. STK11, TERT, TP53, TSC1, TSC2, and VHL.  The following genes were evaluated for sequence changes only: MITF, SDHA and HOXB13 c.251G>A variant only.  Results: No pathogenic mutations identified. 3 VU'sS in BRCA1 c.2967T>A (p.Phe989Leu) , MSH6 c.2857G>A (p.Glu953Lys), and TERT c.902G>A (p.Arg301His) were identified.  The date of this test result is 12/24/2016.        12/28/2016  Surgery    Right lumpectomy: IDC grade 2, 1.2 cm, broadly 0.1 cm to the posterior margin, 0/5 lymph nodes negative, ER 0%, PR 0%, HER-2 negative ratio 1.05, Ki-67 5%, T1 cN0 stage IB AJCC 8      01/25/2017 -  Chemotherapy    Adjuvant chemotherapy with dose dense Adriamycin and Cytoxan 4 followed by Taxol weekly 12        CHIEF COMPLIANT: Follow-up to discuss treatment options  INTERVAL HISTORY: BEILA PURDIE is a 52 year old with above-mentioned history of right breast cancer who underwent lumpectomy and is completed adjuvant chemotherapy but now developed a new incidence of DCIS in the right breast.  She is very anxious and worried and had several questions and therefore she made an appointment to see me today.  She appears to be extremely anxious and in tears.  REVIEW OF SYSTEMS:   Constitutional: Denies fevers, chills or abnormal weight loss Eyes: Denies blurriness of vision Ears, nose, mouth, throat, and face: Denies mucositis or sore throat Respiratory: Denies cough, dyspnea or wheezes Cardiovascular: Denies palpitation, chest discomfort Gastrointestinal:  Denies nausea, heartburn or change in bowel habits Skin: Denies abnormal skin rashes Lymphatics: Denies new lymphadenopathy or easy bruising Neurological:Denies numbness, tingling or new weaknesses Behavioral/Psych: Mood is stable, no new changes  Extremities: No lower extremity edema Breast: Recent right breast biopsy All other systems were reviewed with the patient and are negative.  I have reviewed the past medical history, past surgical history, social history and family history with the patient and they are unchanged from previous note.  ALLERGIES:  has No Known Allergies.  MEDICATIONS:  Current Outpatient Medications  Medication Sig Dispense Refill  . acetaminophen (TYLENOL)  500 MG tablet Take 500 mg by mouth every 6 (six) hours as needed.    Marland Kitchen albuterol (PROVENTIL HFA;VENTOLIN HFA) 108 (90 Base) MCG/ACT inhaler  Inhale 1-2 puffs into the lungs every 4 (four) hours as needed for wheezing or shortness of breath.    Marland Kitchen aspirin EC 81 MG tablet Take 81 mg by mouth daily.    Marland Kitchen azithromycin (ZITHROMAX) 250 MG tablet TAKE 2 TABLETS BY MOUTH TODAY, THEN TAKE 1 TABLET DAILY FOR 4 DAYS  0  . benzonatate (TESSALON) 100 MG capsule     . Biotin 1000 MCG tablet Take 1,000 mcg by mouth 3 (three) times daily.    . calcium-vitamin D (OSCAL WITH D) 500-200 MG-UNIT tablet Take 1 tablet by mouth.    . dexamethasone (DECADRON) 4 MG tablet Take 1 tablet (4 mg total) by mouth daily. Take 1 tablet day after chemotherapy and one more tablet the following day with food 8 tablet 0  . fluticasone (FLONASE) 50 MCG/ACT nasal spray Place 1 spray into both nostrils as needed for allergies or rhinitis.    [name] Oil 1000 MG CAPS Take by mouth.    . lidocaine-prilocaine (EMLA) cream Apply to affected area once 30 g 3  . LORazepam (ATIVAN) 0.5 MG tablet Take 1 tablet (0.5 mg total) by mouth at bedtime. 30 tablet 0  . mometasone (NASONEX) 50 MCG/ACT nasal spray 2 SPRAYS IN EACH NOSTRIL ONCE A DAY IF NEEDED NASALLY  2  . Multiple Vitamins-Minerals (OCUVITE ADULT FORMULA) CAPS Take by mouth.    . ondansetron (ZOFRAN) 8 MG tablet Take 1 tablet (8 mg total) by mouth 2 (two) times daily as needed. Start on the third day after chemotherapy. 30 tablet 1  . potassium chloride (K-DUR) 10 MEQ tablet Take 2 tablets (20 mEq total) by mouth 2 (two) times daily. 120 tablet 0  . prochlorperazine (COMPAZINE) 10 MG tablet Take 1 tablet (10 mg total) by mouth every 6 (six) hours as needed (Nausea or vomiting). 30 tablet 1   No current facility-administered medications for this visit.     PHYSICAL EXAMINATION: ECOG PERFORMANCE STATUS: 1 - Symptomatic but completely ambulatory  Vitals:   06/24/17 0816  BP: 114/75  Pulse: 97  Resp: 14  Temp: 98.6 F (37 C)  SpO2: 95%   Filed Weights   06/24/17 0816  Weight: 104 lb (47.2 kg)    GENERAL:alert, no  distress and comfortable SKIN: skin color, texture, turgor are normal, no rashes or significant lesions EYES: normal, Conjunctiva are pink and non-injected, sclera clear OROPHARYNX:no exudate, no erythema and lips, buccal mucosa, and tongue normal  NECK: supple, thyroid normal size, non-tender, without nodularity LYMPH:  no palpable lymphadenopathy in the cervical, axillary or inguinal LUNGS: clear to auscultation and percussion with normal breathing effort HEART: regular rate & rhythm and no murmurs and no lower extremity edema ABDOMEN:abdomen soft, non-tender and normal bowel sounds MUSCULOSKELETAL:no cyanosis of digits and no clubbing  NEURO: alert & oriented x 3 with fluent speech, no focal motor/sensory deficits EXTREMITIES: No lower extremity edema  LABORATORY DATA:  I have reviewed the data as listed CMP Latest Ref Rng & Units 06/14/2017 06/07/2017 05/31/2017  Glucose 70 - 140 mg/dL 85 108 92  BUN 7 - 26 mg/dL '8 10 7  '$ Creatinine 0.60 - 1.10 mg/dL 0.64 0.73 0.67  Sodium 136 - 145 mmol/L 142 143 141  Potassium 3.5 - 5.1 mmol/L 3.7 3.5 3.8  Chloride 98 - 109 mmol/L 107 108 106  CO2 22 - 29 mmol/L '25 24 27  '$ Calcium 8.4 - 10.4 mg/dL 9.3 9.3 9.5  Total Protein 6.4 - 8.3 g/dL 6.4 6.5 6.6  Total Bilirubin 0.2 - 1.2 mg/dL 0.2 0.2 0.2  Alkaline Phos 40 - 150 U/L 70 71 79  AST 5 - 34 U/L '23 24 26  '$ ALT 0 - 55 U/L 25 29 34    Lab Results  Component Value Date   WBC 3.7 (L) 06/14/2017   HGB 12.7 06/14/2017   HCT 38.2 06/14/2017   MCV 92.5 06/14/2017   PLT 240 06/14/2017   NEUTROABS 2.3 06/14/2017    ASSESSMENT & PLAN:  Malignant neoplasm of upper-outer quadrant of right breast in female, estrogen receptor positive ([place]) 12/28/2016: Right lumpectomy: IDC grade 2, 1.2 cm, broadly 0.1 cm to the posterior margin, 0/5 lymph nodes negative, ER 0%, PR 0%, HER-2 negative ratio 1.05, Ki-67 5%, T1 cN0 stage IB AJCC 8  Recommendation: 1. Adjuvant chemotherapy with dose dense Adriamycin and  Cytoxan every 2 weeks 4 followed by weekly Taxol 12  completed 06/14/2017 2. followed by adjuvant radiation therapy ---------------------------------------------------------------------- New diagnosis of DCIS estrogen receptor positive: Patient would undergo surgery for the DCIS prior to radiation therapy Answered her questions regarding surgery and treatment options as well as the tumor board recommendations.  Plan is to obtain MRI of the breasts to make sure there is no other abnormality. I sent a message to Dr. Marlou Starks regarding the plan to obtain the MRI. She will make a final decision regarding lumpectomy versus mastectomy based upon the MRI result.  Assuming that there is no other evidence of disease elsewhere, she plans to undergo lumpectomy.  She had met with plastic surgery and is very anxious and terrified about her reconstructive process. Return to clinic after repeat surgery.  I spent 25 minutes talking to the patient of which more than half was spent in counseling and coordination of care.  No orders of the defined types were placed in this encounter.  The patient has a good understanding of the overall plan. she agrees with it. she will call with any problems that may develop before the next visit here.   Harriette Ohara, MD 06/24/17

## 2017-06-24 NOTE — Assessment & Plan Note (Signed)
12/28/2016: Right lumpectomy: IDC grade 2, 1.2 cm, broadly 0.1 cm to the posterior margin, 0/5 lymph nodes negative, ER 0%, PR 0%, HER-2 negative ratio 1.05, Ki-67 5%, T1 cN0 stage IB AJCC 8  Recommendation: 1. Adjuvant chemotherapy with dose dense Adriamycin and Cytoxan every 2 weeks 4 followed by weekly Taxol 12 completed 06/14/2017 2. followed by adjuvant radiation therapy ---------------------------------------------------------------------- New diagnosis of DCIS estrogen receptor positive: Patient would undergo surgery for the DCIS prior to radiation therapy Answer her questions regarding surgery and treatment options as well as the tumor board recommendations.  Return to clinic after radiation to begin antiestrogen therapy

## 2017-06-25 ENCOUNTER — Telehealth: Payer: Self-pay

## 2017-06-25 NOTE — Telephone Encounter (Signed)
Provided pt with number to reschedule her MRI appointment.  Cyndia Bent RN

## 2017-06-26 ENCOUNTER — Ambulatory Visit (HOSPITAL_COMMUNITY): Admission: RE | Admit: 2017-06-26 | Payer: BLUE CROSS/BLUE SHIELD | Source: Ambulatory Visit

## 2017-07-03 ENCOUNTER — Ambulatory Visit (HOSPITAL_COMMUNITY)
Admission: RE | Admit: 2017-07-03 | Discharge: 2017-07-03 | Disposition: A | Discharge status: Home / Self Care | Payer: BLUE CROSS/BLUE SHIELD | Source: Ambulatory Visit | Attending: Hematology and Oncology | Admitting: Hematology and Oncology

## 2017-07-03 DIAGNOSIS — Z170 Estrogen receptor positive status [ER+]: Secondary | ICD-10-CM | POA: Diagnosis not present

## 2017-07-03 DIAGNOSIS — C50411 Malignant neoplasm of upper-outer quadrant of right female breast: Secondary | ICD-10-CM | POA: Diagnosis present

## 2017-07-03 MED ORDER — GADOBENATE DIMEGLUMINE 529 MG/ML IV SOLN
10.0000 mL | Freq: Once | INTRAVENOUS | Status: AC | PRN
  Administered 2017-07-03: 9 mL via INTRAVENOUS

## 2017-07-04 ENCOUNTER — Telehealth: Payer: Self-pay

## 2017-07-04 NOTE — Telephone Encounter (Signed)
Returned pt call regarding questions concerning surgery and plans after. Explained to her surgery would need to be discussed with surgeon and depending on if she decides lumpectomy versus mastectomy would determine if radiation was needed or not.  Cyndia Bent RN

## 2017-07-08 ENCOUNTER — Other Ambulatory Visit: Payer: Self-pay

## 2017-07-08 ENCOUNTER — Telehealth: Payer: Self-pay

## 2017-07-08 DIAGNOSIS — C50411 Malignant neoplasm of upper-outer quadrant of right female breast: Secondary | ICD-10-CM

## 2017-07-08 DIAGNOSIS — Z170 Estrogen receptor positive status [ER+]: Principal | ICD-10-CM

## 2017-07-08 NOTE — Telephone Encounter (Signed)
Called pt to inform her of Ct CAP scheduled on Thursday. Contrast instruction given and she is aware to pick up prior to Thursday. No further questions.  Cyndia Bent RN

## 2017-07-09 ENCOUNTER — Ambulatory Visit: Payer: Self-pay | Admitting: General Surgery

## 2017-07-11 ENCOUNTER — Telehealth: Payer: Self-pay | Admitting: Hematology and Oncology

## 2017-07-11 ENCOUNTER — Ambulatory Visit (HOSPITAL_COMMUNITY)
Admission: RE | Admit: 2017-07-11 | Discharge: 2017-07-11 | Disposition: A | Discharge status: Home / Self Care | Payer: BLUE CROSS/BLUE SHIELD | Source: Ambulatory Visit | Attending: Hematology and Oncology | Admitting: Hematology and Oncology

## 2017-07-11 DIAGNOSIS — Z9889 Other specified postprocedural states: Secondary | ICD-10-CM | POA: Diagnosis not present

## 2017-07-11 DIAGNOSIS — Z170 Estrogen receptor positive status [ER+]: Secondary | ICD-10-CM | POA: Insufficient documentation

## 2017-07-11 DIAGNOSIS — D259 Leiomyoma of uterus, unspecified: Secondary | ICD-10-CM | POA: Insufficient documentation

## 2017-07-11 DIAGNOSIS — C50411 Malignant neoplasm of upper-outer quadrant of right female breast: Secondary | ICD-10-CM | POA: Diagnosis present

## 2017-07-11 MED ORDER — IOPAMIDOL (ISOVUE-300) INJECTION 61%
INTRAVENOUS | Status: AC
  Filled 2017-07-11: qty 100

## 2017-07-11 MED ORDER — SODIUM CHLORIDE 0.9 % IJ SOLN
INTRAMUSCULAR | Status: AC
  Filled 2017-07-11: qty 50

## 2017-07-11 MED ORDER — IOPAMIDOL (ISOVUE-300) INJECTION 61%
100.0000 mL | Freq: Once | INTRAVENOUS | Status: AC | PRN
  Administered 2017-07-11: 100 mL via INTRAVENOUS

## 2017-07-11 NOTE — Telephone Encounter (Signed)
I called the patient with the results of the CT chest abdomen pelvis. No evidence of metastatic disease. Uterine fibroids.

## 2017-07-15 ENCOUNTER — Telehealth: Payer: Self-pay

## 2017-07-15 NOTE — Telephone Encounter (Signed)
Pt is wanting a referral for a second opinion. Dr. Lindi Adie suggests Dr. Erline Levine at [facility]. Attempted to call pt, did not go to voicemail.  Cyndia Bent RN

## 2017-07-16 ENCOUNTER — Other Ambulatory Visit: Payer: Self-pay

## 2017-07-16 ENCOUNTER — Telehealth: Payer: Self-pay | Admitting: Hematology and Oncology

## 2017-07-16 ENCOUNTER — Telehealth: Payer: Self-pay

## 2017-07-16 DIAGNOSIS — C50411 Malignant neoplasm of upper-outer quadrant of right female breast: Secondary | ICD-10-CM

## 2017-07-16 DIAGNOSIS — Z170 Estrogen receptor positive status [ER+]: Principal | ICD-10-CM

## 2017-07-16 NOTE — Telephone Encounter (Signed)
Pt and mother called LVM. 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 Pt is trying to get a second opinion with one of the heme/onc MD there.   Told pt that she will need to come in and sign a consent to release information to fax over to [facility]. Pt will be coming in tomorrow to sign release form and have everything sent over this week.   Will place referral in and send medical records to [facility]. [place] [facility]  Phone: [phone] Fax: [phone]

## 2017-07-16 NOTE — Telephone Encounter (Signed)
Mailed patient calendar of upcoming April appointments.  °

## 2017-07-17 ENCOUNTER — Telehealth: Payer: Self-pay | Admitting: Hematology and Oncology

## 2017-07-17 ENCOUNTER — Telehealth: Payer: Self-pay

## 2017-07-17 NOTE — Telephone Encounter (Signed)
Pt arrived at the lobby this morning to sign a medical release form in order to fax over pt medical records to [facility]. Forwarded referral for second opinion and faxed over records to cyndee at [facility] P: [phone]

## 2017-07-17 NOTE — Telephone Encounter (Signed)
Faxed records to [facility]

## 2017-07-31 NOTE — Pre-Procedure Instructions (Addendum)
RAMYA VANBERGEN  07/31/2017      [id number] - [address], [address] [place] [address] Phone: [phone] Fax: [phone]    Your procedure is scheduled on 08/08/2017.  Report to [facility] Admitting at [place].M.  Call this number if you have problems the morning of surgery:  [phone]   Remember:  Do not eat food or drink liquids after midnight.   Please complete your PRE-SURGERY ENSURE that was given to before you leave your house the morning of surgery.  Please, if able, drink it in one setting. DO NOT SIP.   Continue all medications as directed by your physician except follow these medication instructions before surgery below   Take these medicines the morning of surgery with A SIP OF WATER: Albuterol inhaler - if needed (Bring with you the morning of surgery) Mometasone (Nasonex) - if needed  7 days prior to surgery STOP taking any Aspirin(unless otherwise instructed by your surgeon), Aleve, Naproxen, Ibuprofen, Motrin, Advil, Goody's, BC's, all herbal medications, fish oil, and all vitamins  Follow your doctors instructions regarding your Aspirin.  If no instructions were given by your doctor, then you will need to call the prescribing office office to get instructions.       Do not wear jewelry, make-up or nail polish.  Do not wear lotions, powders, or perfumes, or deodorant.  Do not shave 48 hours prior to surgery.    Do not bring valuables to the hospital.  [facility] is not responsible for any belongings or valuables.  Hearing aids, eyeglasses, contacts, dentures or bridgework may not be worn into surgery.  Leave your suitcase in the car.  After surgery it may be brought to your room.  For patients admitted to the hospital, discharge time will be determined by your treatment team.  Patients discharged the day of surgery will not be allowed to drive home.   Name and phone number of your driver:     Special instructions:   [facility]- Preparing For Surgery  Before surgery, you can play an important role. Because skin is not sterile, your skin needs to be as free of germs as possible. You can reduce the number of germs on your skin by washing with CHG (chlorahexidine gluconate) Soap before surgery.  CHG is an antiseptic cleaner which kills germs and bonds with the skin to continue killing germs even after washing.  Please do not use if you have an allergy to CHG or antibacterial soaps. If your skin becomes reddened/irritated stop using the CHG.  Do not shave (including legs and underarms) for at least 48 hours prior to first CHG shower. It is OK to shave your face.  Please follow these instructions carefully.   1. Shower the NIGHT BEFORE SURGERY and the MORNING OF SURGERY with CHG.   2. If you chose to wash your hair, wash your hair first as usual with your normal shampoo.  3. After you shampoo, rinse your hair and body thoroughly to remove the shampoo.  4. Use CHG as you would any other liquid soap. You can apply CHG directly to the skin and wash gently with a scrungie or a clean washcloth.   5. Apply the CHG Soap to your body ONLY FROM THE NECK DOWN.  Do not use on open wounds or open sores. Avoid contact with your eyes, ears, mouth and genitals (private parts). Wash Face and genitals (private parts)  with your  normal soap.  6. Wash thoroughly, paying special attention to the area where your surgery will be performed.  7. Thoroughly rinse your body with warm water from the neck down.  8. DO NOT shower/wash with your normal soap after using and rinsing off the CHG Soap.  9. Pat yourself dry with a CLEAN TOWEL.  10. Wear CLEAN PAJAMAS to bed the night before surgery, wear comfortable clothes the morning of surgery  11. Place CLEAN SHEETS on your bed the night of your first shower and DO NOT SLEEP WITH PETS.    Day of Surgery: Do not apply any deodorants/lotions. Please wear  clean clothes to the hospital/surgery center.      Please read over the following fact sheets that you were given.

## 2017-08-01 ENCOUNTER — Encounter (HOSPITAL_COMMUNITY)
Admission: RE | Admit: 2017-08-01 | Discharge: 2017-08-01 | Disposition: A | Discharge status: Home / Self Care | Payer: BLUE CROSS/BLUE SHIELD | Source: Ambulatory Visit | Attending: General Surgery | Admitting: General Surgery

## 2017-08-01 ENCOUNTER — Encounter (HOSPITAL_COMMUNITY): Payer: Self-pay | Admitting: *Deleted

## 2017-08-01 ENCOUNTER — Other Ambulatory Visit: Payer: Self-pay

## 2017-08-01 DIAGNOSIS — C50911 Malignant neoplasm of unspecified site of right female breast: Secondary | ICD-10-CM | POA: Insufficient documentation

## 2017-08-01 DIAGNOSIS — Z01818 Encounter for other preprocedural examination: Secondary | ICD-10-CM | POA: Diagnosis present

## 2017-08-01 HISTORY — DX: Family history of other specified conditions: Z84.89

## 2017-08-01 HISTORY — DX: Dizziness and giddiness: R42

## 2017-08-01 LAB — BASIC METABOLIC PANEL
Anion gap: 11 (ref 5–15)
BUN: 10 mg/dL (ref 6–20)
CHLORIDE: 104 mmol/L (ref 101–111)
CO2: 25 mmol/L (ref 22–32)
Calcium: 9.2 mg/dL (ref 8.9–10.3)
Creatinine, Ser: 0.65 mg/dL (ref 0.44–1.00)
GFR calc Af Amer: >60 mL/min (ref >60)
GFR calc non Af Amer: >60 mL/min (ref >60)
Glucose, Bld: 101 mg/dL — ABNORMAL HIGH (ref 65–99)
POTASSIUM: 4.2 mmol/L (ref 3.5–5.1)
SODIUM: 140 mmol/L (ref 135–145)

## 2017-08-01 LAB — CBC
HCT: 42.6 % (ref 36.0–46.0)
HEMOGLOBIN: 14 g/dL (ref 12.0–15.0)
MCH: 29.4 pg (ref 26.0–34.0)
MCHC: 32.9 g/dL (ref 30.0–36.0)
MCV: 89.5 fL (ref 78.0–100.0)
Platelets: 184 10*3/uL (ref 150–400)
RBC: 4.76 MIL/uL (ref 3.87–5.11)
RDW: 13.1 % (ref 11.5–15.5)
WBC: 7.3 10*3/uL (ref 4.0–10.5)

## 2017-08-01 NOTE — Progress Notes (Signed)
Oncologist is DR. V Gudena  LOV 06/2017 PCP is Dr. Leia Alf  LOV 06/2017 Did have echo, with EF 60-65% (12/2016)

## 2017-08-08 ENCOUNTER — Ambulatory Visit (HOSPITAL_COMMUNITY)
Admission: RE | Admit: 2017-08-08 | Discharge: 2017-08-09 | Disposition: A | Discharge status: Home / Self Care | Payer: BLUE CROSS/BLUE SHIELD | Source: Ambulatory Visit | Attending: General Surgery | Admitting: General Surgery

## 2017-08-08 ENCOUNTER — Ambulatory Visit (HOSPITAL_COMMUNITY): Payer: BLUE CROSS/BLUE SHIELD | Admitting: Certified Registered"

## 2017-08-08 ENCOUNTER — Encounter (HOSPITAL_COMMUNITY)
Admission: RE | Disposition: A | Discharge status: Home / Self Care | Payer: Self-pay | Source: Ambulatory Visit | Attending: General Surgery

## 2017-08-08 ENCOUNTER — Encounter (HOSPITAL_COMMUNITY): Payer: Self-pay | Admitting: Certified Registered"

## 2017-08-08 DIAGNOSIS — D241 Benign neoplasm of right breast: Secondary | ICD-10-CM | POA: Insufficient documentation

## 2017-08-08 DIAGNOSIS — Z452 Encounter for adjustment and management of vascular access device: Secondary | ICD-10-CM | POA: Diagnosis not present

## 2017-08-08 DIAGNOSIS — C50411 Malignant neoplasm of upper-outer quadrant of right female breast: Secondary | ICD-10-CM | POA: Diagnosis present

## 2017-08-08 DIAGNOSIS — Z79899 Other long term (current) drug therapy: Secondary | ICD-10-CM | POA: Diagnosis not present

## 2017-08-08 DIAGNOSIS — F419 Anxiety disorder, unspecified: Secondary | ICD-10-CM | POA: Diagnosis not present

## 2017-08-08 DIAGNOSIS — Z7982 Long term (current) use of aspirin: Secondary | ICD-10-CM | POA: Insufficient documentation

## 2017-08-08 DIAGNOSIS — F329 Major depressive disorder, single episode, unspecified: Secondary | ICD-10-CM | POA: Insufficient documentation

## 2017-08-08 DIAGNOSIS — N6011 Diffuse cystic mastopathy of right breast: Secondary | ICD-10-CM | POA: Insufficient documentation

## 2017-08-08 DIAGNOSIS — Z170 Estrogen receptor positive status [ER+]: Secondary | ICD-10-CM | POA: Diagnosis not present

## 2017-08-08 HISTORY — PX: BREAST RECONSTRUCTION WITH PLACEMENT OF TISSUE EXPANDER AND ALLODERM: SHX6805

## 2017-08-08 HISTORY — PX: PORT-A-CATH REMOVAL: SHX5289

## 2017-08-08 HISTORY — PX: NIPPLE SPARING MASTECTOMY: SHX6537

## 2017-08-08 LAB — POCT PREGNANCY, URINE: Preg Test, Ur: NEGATIVE

## 2017-08-08 SURGERY — MASTECTOMY, NIPPLE SPARING
Anesthesia: General | Site: Breast | Laterality: Right

## 2017-08-08 MED ORDER — GABAPENTIN 300 MG PO CAPS
300.0000 mg | ORAL_CAPSULE | ORAL | Status: AC
  Administered 2017-08-08: 300 mg via ORAL
  Filled 2017-08-08: qty 1

## 2017-08-08 MED ORDER — CEFAZOLIN SODIUM-DEXTROSE 1-4 GM/50ML-% IV SOLN
1.0000 g | Freq: Three times a day (TID) | INTRAVENOUS | Status: AC
  Administered 2017-08-08 – 2017-08-09 (×3): 1 g via INTRAVENOUS
  Filled 2017-08-08 (×3): qty 50

## 2017-08-08 MED ORDER — HYDROCODONE-ACETAMINOPHEN 5-325 MG PO TABS: 1.0000 | ORAL_TABLET | Freq: Four times a day (QID) | ORAL | 0 refills | Status: DC | PRN

## 2017-08-08 MED ORDER — HYDROMORPHONE HCL 1 MG/ML IJ SOLN
0.2500 mg | INTRAMUSCULAR | Status: DC | PRN
  Administered 2017-08-08: 0.5 mg via INTRAVENOUS

## 2017-08-08 MED ORDER — LACTATED RINGERS IV SOLN
INTRAVENOUS | Status: DC | PRN
  Administered 2017-08-08 (×2): via INTRAVENOUS

## 2017-08-08 MED ORDER — PHENYLEPHRINE 40 MCG/ML (10ML) SYRINGE FOR IV PUSH (FOR BLOOD PRESSURE SUPPORT)
PREFILLED_SYRINGE | INTRAVENOUS | Status: AC
  Filled 2017-08-08: qty 10

## 2017-08-08 MED ORDER — LIDOCAINE 2% (20 MG/ML) 5 ML SYRINGE
INTRAMUSCULAR | Status: DC | PRN
  Administered 2017-08-08: 80 mg via INTRAVENOUS

## 2017-08-08 MED ORDER — ROCURONIUM BROMIDE 10 MG/ML (PF) SYRINGE
PREFILLED_SYRINGE | INTRAVENOUS | Status: AC
  Filled 2017-08-08: qty 5

## 2017-08-08 MED ORDER — CHLORHEXIDINE GLUCONATE CLOTH 2 % EX PADS: 6.0000 | MEDICATED_PAD | Freq: Once | CUTANEOUS | Status: DC

## 2017-08-08 MED ORDER — ROCURONIUM BROMIDE 100 MG/10ML IV SOLN
INTRAVENOUS | Status: DC | PRN
  Administered 2017-08-08: 40 mg via INTRAVENOUS
  Administered 2017-08-08: 10 mg via INTRAVENOUS

## 2017-08-08 MED ORDER — PHENYLEPHRINE 40 MCG/ML (10ML) SYRINGE FOR IV PUSH (FOR BLOOD PRESSURE SUPPORT)
PREFILLED_SYRINGE | INTRAVENOUS | Status: DC | PRN
  Administered 2017-08-08 (×5): 40 ug via INTRAVENOUS

## 2017-08-08 MED ORDER — SODIUM CHLORIDE 0.9 % IV SOLN
Freq: Once | INTRAVENOUS | Status: DC
  Filled 2017-08-08: qty 1

## 2017-08-08 MED ORDER — HEPARIN SODIUM (PORCINE) 5000 UNIT/ML IJ SOLN
5000.0000 [IU] | Freq: Three times a day (TID) | INTRAMUSCULAR | Status: DC
  Administered 2017-08-09: 5000 [IU] via SUBCUTANEOUS
  Filled 2017-08-08: qty 1

## 2017-08-08 MED ORDER — GABAPENTIN 300 MG PO CAPS: 300.0000 mg | ORAL_CAPSULE | ORAL | Status: DC

## 2017-08-08 MED ORDER — KETOROLAC TROMETHAMINE 30 MG/ML IJ SOLN
30.0000 mg | Freq: Three times a day (TID) | INTRAMUSCULAR | Status: AC
  Administered 2017-08-08 – 2017-08-09 (×3): 30 mg via INTRAVENOUS
  Filled 2017-08-08 (×3): qty 1

## 2017-08-08 MED ORDER — LIDOCAINE HCL (CARDIAC) 20 MG/ML IV SOLN
INTRAVENOUS | Status: AC
  Filled 2017-08-08: qty 5

## 2017-08-08 MED ORDER — SODIUM CHLORIDE 0.9 % IV SOLN
INTRAVENOUS | Status: DC | PRN
  Administered 2017-08-08: 1000 mL

## 2017-08-08 MED ORDER — METHOCARBAMOL 500 MG PO TABS
500.0000 mg | ORAL_TABLET | Freq: Four times a day (QID) | ORAL | Status: DC | PRN
  Administered 2017-08-09: 500 mg via ORAL
  Filled 2017-08-08: qty 1

## 2017-08-08 MED ORDER — FENTANYL CITRATE (PF) 250 MCG/5ML IJ SOLN
INTRAMUSCULAR | Status: AC
  Filled 2017-08-08: qty 5

## 2017-08-08 MED ORDER — HYDROMORPHONE HCL 1 MG/ML IJ SOLN
INTRAMUSCULAR | Status: AC
  Filled 2017-08-08: qty 1

## 2017-08-08 MED ORDER — ACETAMINOPHEN 500 MG PO TABS: 1000.0000 mg | ORAL_TABLET | ORAL | Status: DC

## 2017-08-08 MED ORDER — PROPOFOL 10 MG/ML IV BOLUS
INTRAVENOUS | Status: DC | PRN
  Administered 2017-08-08: 50 mg via INTRAVENOUS

## 2017-08-08 MED ORDER — ATORVASTATIN CALCIUM 10 MG PO TABS: 10.0000 mg | ORAL_TABLET | Freq: Every evening | ORAL | Status: DC

## 2017-08-08 MED ORDER — MIDAZOLAM HCL 2 MG/2ML IJ SOLN
INTRAMUSCULAR | Status: AC
  Filled 2017-08-08: qty 2

## 2017-08-08 MED ORDER — ALBUTEROL SULFATE (2.5 MG/3ML) 0.083% IN NEBU: 2.5000 mg | INHALATION_SOLUTION | RESPIRATORY_TRACT | Status: DC | PRN

## 2017-08-08 MED ORDER — MIDAZOLAM HCL 5 MG/5ML IJ SOLN
INTRAMUSCULAR | Status: DC | PRN
  Administered 2017-08-08: 2 mg via INTRAVENOUS

## 2017-08-08 MED ORDER — ONDANSETRON HCL 4 MG/2ML IJ SOLN: 4.0000 mg | Freq: Once | INTRAMUSCULAR | Status: DC | PRN

## 2017-08-08 MED ORDER — METHOCARBAMOL 750 MG PO TABS: 750.0000 mg | ORAL_TABLET | Freq: Four times a day (QID) | ORAL | 2 refills | Status: DC | PRN

## 2017-08-08 MED ORDER — BUPIVACAINE-EPINEPHRINE (PF) 0.25% -1:200000 IJ SOLN
INTRAMUSCULAR | Status: AC
  Filled 2017-08-08: qty 30

## 2017-08-08 MED ORDER — PROMETHAZINE HCL 25 MG/ML IJ SOLN
INTRAMUSCULAR | Status: DC | PRN
  Administered 2017-08-08: 6.25 mg via INTRAVENOUS

## 2017-08-08 MED ORDER — ONDANSETRON HCL 4 MG/2ML IJ SOLN
INTRAMUSCULAR | Status: DC | PRN
  Administered 2017-08-08 (×2): 4 mg via INTRAVENOUS

## 2017-08-08 MED ORDER — FENTANYL CITRATE (PF) 100 MCG/2ML IJ SOLN
INTRAMUSCULAR | Status: DC | PRN
  Administered 2017-08-08 (×5): 50 ug via INTRAVENOUS

## 2017-08-08 MED ORDER — ONDANSETRON HCL 4 MG/2ML IJ SOLN
INTRAMUSCULAR | Status: AC
  Filled 2017-08-08: qty 2

## 2017-08-08 MED ORDER — HYDROCODONE-ACETAMINOPHEN 5-325 MG PO TABS
1.0000 | ORAL_TABLET | ORAL | Status: DC | PRN
  Administered 2017-08-09 (×2): 1 via ORAL
  Filled 2017-08-08 (×2): qty 1

## 2017-08-08 MED ORDER — 0.9 % SODIUM CHLORIDE (POUR BTL) OPTIME
TOPICAL | Status: DC | PRN
  Administered 2017-08-08 (×2): 1000 mL

## 2017-08-08 MED ORDER — PANTOPRAZOLE SODIUM 40 MG PO TBEC
40.0000 mg | DELAYED_RELEASE_TABLET | Freq: Every day | ORAL | Status: DC
  Administered 2017-08-08: 40 mg via ORAL
  Filled 2017-08-08: qty 1

## 2017-08-08 MED ORDER — BUPIVACAINE-EPINEPHRINE 0.25% -1:200000 IJ SOLN
INTRAMUSCULAR | Status: DC | PRN
  Administered 2017-08-08: 9 mL

## 2017-08-08 MED ORDER — ONDANSETRON HCL 4 MG/2ML IJ SOLN
4.0000 mg | Freq: Four times a day (QID) | INTRAMUSCULAR | Status: DC | PRN
Start: 2017-08-08 — End: 2017-08-09
  Administered 2017-08-08: 4 mg via INTRAVENOUS
  Filled 2017-08-08: qty 2

## 2017-08-08 MED ORDER — CELECOXIB 200 MG PO CAPS
200.0000 mg | ORAL_CAPSULE | ORAL | Status: AC
  Administered 2017-08-08: 200 mg via ORAL
  Filled 2017-08-08: qty 1

## 2017-08-08 MED ORDER — PROPOFOL 10 MG/ML IV BOLUS
INTRAVENOUS | Status: AC
  Filled 2017-08-08: qty 20

## 2017-08-08 MED ORDER — PANTOPRAZOLE SODIUM 40 MG IV SOLR: 40.0000 mg | Freq: Every day | INTRAVENOUS | Status: DC

## 2017-08-08 MED ORDER — MEPERIDINE HCL 50 MG/ML IJ SOLN: 6.2500 mg | INTRAMUSCULAR | Status: DC | PRN

## 2017-08-08 MED ORDER — SCOPOLAMINE 1 MG/3DAYS TD PT72
MEDICATED_PATCH | TRANSDERMAL | Status: DC | PRN
  Administered 2017-08-08: 1 via TRANSDERMAL

## 2017-08-08 MED ORDER — CELECOXIB 200 MG PO CAPS: 200.0000 mg | ORAL_CAPSULE | ORAL | Status: DC

## 2017-08-08 MED ORDER — CEFAZOLIN SODIUM-DEXTROSE 2-4 GM/100ML-% IV SOLN
2.0000 g | INTRAVENOUS | Status: AC
  Administered 2017-08-08: 2 g via INTRAVENOUS
  Filled 2017-08-08: qty 100

## 2017-08-08 MED ORDER — CEFAZOLIN SODIUM-DEXTROSE 2-4 GM/100ML-% IV SOLN: 2.0000 g | INTRAVENOUS | Status: DC

## 2017-08-08 MED ORDER — KCL IN DEXTROSE-NACL 20-5-0.9 MEQ/L-%-% IV SOLN
INTRAVENOUS | Status: DC
  Administered 2017-08-08: 14:00:00 via INTRAVENOUS
  Filled 2017-08-08 (×2): qty 1000

## 2017-08-08 MED ORDER — SUGAMMADEX SODIUM 200 MG/2ML IV SOLN
INTRAVENOUS | Status: DC | PRN
  Administered 2017-08-08: 100 mg via INTRAVENOUS

## 2017-08-08 MED ORDER — DEXAMETHASONE SODIUM PHOSPHATE 10 MG/ML IJ SOLN
INTRAMUSCULAR | Status: DC | PRN
  Administered 2017-08-08: 10 mg via INTRAVENOUS

## 2017-08-08 MED ORDER — ONDANSETRON 4 MG PO TBDP: 4.0000 mg | ORAL_TABLET | Freq: Four times a day (QID) | ORAL | Status: DC | PRN

## 2017-08-08 MED ORDER — ACETAMINOPHEN 500 MG PO TABS
1000.0000 mg | ORAL_TABLET | ORAL | Status: AC
  Administered 2017-08-08: 1000 mg via ORAL
  Filled 2017-08-08: qty 2

## 2017-08-08 MED ORDER — MORPHINE SULFATE (PF) 4 MG/ML IV SOLN
1.0000 mg | INTRAVENOUS | Status: DC | PRN
  Filled 2017-08-08: qty 1

## 2017-08-08 SURGICAL SUPPLY — 81 items
ALLODERM 4X16 TRU-THICK (Tissue) ×1 IMPLANT
ALLODERM CONTOUR PERF LRG RTU (Tissue) ×3 IMPLANT
ALLODERM LG CONTOUR PERF RTU (Tissue) ×1 IMPLANT
ALLODERM RTU 4X16 TRU-THICK (Tissue) ×3 IMPLANT
APPLIER CLIP 9.375 MED OPEN (MISCELLANEOUS) ×4
BAG DECANTER FOR FLEXI CONT (MISCELLANEOUS) ×4 IMPLANT
BINDER BREAST LRG (GAUZE/BANDAGES/DRESSINGS) ×1 IMPLANT
BINDER BREAST XLRG (GAUZE/BANDAGES/DRESSINGS) IMPLANT
BLADE SURG 15 STRL LF DISP TIS (BLADE) ×3 IMPLANT
BLADE SURG 15 STRL SS (BLADE) ×1
CANISTER SUCT 3000ML PPV (MISCELLANEOUS) ×4 IMPLANT
CHLORAPREP W/TINT 10.5 ML (MISCELLANEOUS) ×4 IMPLANT
CHLORAPREP W/TINT 26ML (MISCELLANEOUS) ×3 IMPLANT
CLIP APPLIE 9.375 MED OPEN (MISCELLANEOUS) IMPLANT
CLSR STERI-STRIP ANTIMIC 1/2X4 (GAUZE/BANDAGES/DRESSINGS) ×4 IMPLANT
COVER SURGICAL LIGHT HANDLE (MISCELLANEOUS) ×8 IMPLANT
DERMABOND ADVANCED (GAUZE/BANDAGES/DRESSINGS) ×3
DERMABOND ADVANCED .7 DNX12 (GAUZE/BANDAGES/DRESSINGS) ×9 IMPLANT
DRAIN CHANNEL 15F RND FF W/TCR (WOUND CARE) ×1 IMPLANT
DRAPE HALF SHEET 40X57 (DRAPES) ×1 IMPLANT
DRAPE INCISE IOBAN 66X45 STRL (DRAPES) IMPLANT
DRAPE LAPAROTOMY 100X72 PEDS (DRAPES) ×3 IMPLANT
DRAPE ORTHO SPLIT 77X108 STRL (DRAPES) ×2
DRAPE SURG ORHT 6 SPLT 77X108 (DRAPES) ×6 IMPLANT
DRAPE UTILITY XL STRL (DRAPES) ×4 IMPLANT
DRAPE WARM FLUID 44X44 (DRAPE) ×4 IMPLANT
DRSG PAD ABDOMINAL 8X10 ST (GAUZE/BANDAGES/DRESSINGS) ×8 IMPLANT
DRSG TEGADERM 2-3/8X2-3/4 SM (GAUZE/BANDAGES/DRESSINGS) ×4 IMPLANT
DRSG TEGADERM 4X4.75 (GAUZE/BANDAGES/DRESSINGS) ×16 IMPLANT
ELECT BLADE 4.0 EZ CLEAN MEGAD (MISCELLANEOUS) ×4
ELECT CAUTERY BLADE 6.4 (BLADE) ×8 IMPLANT
ELECT COATED BLADE 2.86 ST (ELECTRODE) ×4 IMPLANT
ELECT REM PT RETURN 9FT ADLT (ELECTROSURGICAL) ×8
ELECTRODE BLDE 4.0 EZ CLN MEGD (MISCELLANEOUS) ×3 IMPLANT
ELECTRODE REM PT RTRN 9FT ADLT (ELECTROSURGICAL) ×6 IMPLANT
EVACUATOR SILICONE 100CC (DRAIN) ×2 IMPLANT
EXPANDER BREAST TISSUE 300CC (Breast) ×1 IMPLANT
GAUZE SPONGE 4X4 12PLY STRL (GAUZE/BANDAGES/DRESSINGS) ×4 IMPLANT
GAUZE SPONGE 4X4 16PLY XRAY LF (GAUZE/BANDAGES/DRESSINGS) ×4 IMPLANT
GLOVE BIO SURGEON STRL SZ 6 (GLOVE) ×12 IMPLANT
GLOVE BIO SURGEON STRL SZ7.5 (GLOVE) ×4 IMPLANT
GOWN STRL REUS W/ TWL LRG LVL3 (GOWN DISPOSABLE) ×12 IMPLANT
GOWN STRL REUS W/TWL LRG LVL3 (GOWN DISPOSABLE) ×4
KIT BASIN OR (CUSTOM PROCEDURE TRAY) ×8 IMPLANT
KIT MARKER MARGIN INK (KITS) ×1 IMPLANT
KIT TURNOVER KIT B (KITS) ×8 IMPLANT
MARKER SKIN DUAL TIP RULER LAB (MISCELLANEOUS) ×4 IMPLANT
NDL HYPO 25GX1X1/2 BEV (NEEDLE) ×3 IMPLANT
NEEDLE HYPO 25GX1X1/2 BEV (NEEDLE) ×4 IMPLANT
NS IRRIG 1000ML POUR BTL (IV SOLUTION) ×12 IMPLANT
PACK GENERAL/GYN (CUSTOM PROCEDURE TRAY) ×4 IMPLANT
PACK SURGICAL SETUP 50X90 (CUSTOM PROCEDURE TRAY) ×3 IMPLANT
PAD ARMBOARD 7.5X6 YLW CONV (MISCELLANEOUS) ×12 IMPLANT
PENCIL BUTTON HOLSTER BLD 10FT (ELECTRODE) ×3 IMPLANT
PIN SAFETY STERILE (MISCELLANEOUS) ×4 IMPLANT
PUNCH BIOPSY 4MM (MISCELLANEOUS) ×4
PUNCH BIOPSY DISP 4 (MISCELLANEOUS) IMPLANT
SET ASEPTIC TRANSFER (MISCELLANEOUS) ×4 IMPLANT
SOL PREP POV-IOD 4OZ 10% (MISCELLANEOUS) ×4 IMPLANT
STAPLER VISISTAT 35W (STAPLE) ×4 IMPLANT
SUT CHROMIC 4 0 PS 2 18 (SUTURE) ×6 IMPLANT
SUT ETHILON 2 0 FS 18 (SUTURE) ×7 IMPLANT
SUT MNCRL 3 0 RB1 (SUTURE) IMPLANT
SUT MNCRL AB 4-0 PS2 18 (SUTURE) ×6 IMPLANT
SUT MONOCRYL 3 0 RB1 (SUTURE)
SUT VIC AB 0 CT1 27 (SUTURE) ×2
SUT VIC AB 0 CT1 27XBRD ANBCTR (SUTURE) IMPLANT
SUT VIC AB 3-0 SH 27 (SUTURE) ×3
SUT VIC AB 3-0 SH 27X BRD (SUTURE) ×3 IMPLANT
SUT VICRYL 3 0 (SUTURE) IMPLANT
SUT VICRYL 4-0 PS2 18IN ABS (SUTURE) ×2 IMPLANT
SUT VLOC 180 0 24IN GS25 (SUTURE) ×1 IMPLANT
SYR BULB IRRIGATION 50ML (SYRINGE) ×4 IMPLANT
SYR CONTROL 10ML LL (SYRINGE) ×4 IMPLANT
TAPE STRIPS DRAPE STRL (GAUZE/BANDAGES/DRESSINGS) ×4 IMPLANT
TISSUE ALLDRM CONTOUR LRG MED (Tissue) IMPLANT
TISSUE ALLDRM RTU 4X16 TRU-TH (Tissue) IMPLANT
TOWEL OR 17X24 6PK STRL BLUE (TOWEL DISPOSABLE) ×8 IMPLANT
TOWEL OR 17X26 10 PK STRL BLUE (TOWEL DISPOSABLE) ×8 IMPLANT
TRAY FOLEY W/METER SILVER 16FR (SET/KITS/TRAYS/PACK) IMPLANT
TUBE CONNECTING 12X1/4 (SUCTIONS) ×4 IMPLANT

## 2017-08-08 NOTE — Op Note (Signed)
Operative Note   DATE OF OPERATION: 4.4.19  LOCATION: [facility] OR-observation  SURGICAL DIVISION: Plastic Surgery  PREOPERATIVE DIAGNOSES:  1. 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 Right breast reconstruction with tissue expander 2. Acellular dermis (Alloderm) for breast reconstruction 300 cm2  SURGEON: Irene Limbo MD MBA  ASSISTANT: Fredrik Cove MD  ANESTHESIA:  General.   EBL: 50 ml for entire procedure  COMPLICATIONS: None immediate.   INDICATIONS FOR PROCEDURE:  The patient, Lorraine Dean, is a 52 y.o. female born on 02-18-1966, is here for immediate prepectoral expander acellular dermis reconstruction following nipple sparing mastectomy.   FINDINGS: Natrelle [id number] 300 ml tissue expander placed, initial fill volume 180 ml air. SN [id number]  DESCRIPTION OF PROCEDURE:  Following induction of general anesthesia, the patient's operative site was prepped and draped in a sterile fashion. A time out was performed and all information was confirmed to be correct.I assisted in right mastectomy with retraction and exposure.  Following completion of mastectomy, the cavity was irrigated with solution containing Ancef, gentamicin, and bacitracin. Hemostasis was ensured.Laterally the mastectomy flap over posterior axillary line was advanced anteriorly and the subcutaneous tissue and superficial fascia was secured to pectoralis muscle andserratus musclewith 0-vicryl.A 19 Fr drain was placed in subcutaneous position laterally and a 15 Fr drain placed along inframammary fold. Each secured to skin with 2-0 nylon. Cavity irrigated with Betadine. The tissue expander was prepared on back table prior in insertion. The expander was filled with air to 180 ml. Perforated acellular dermis was draped over anterior surface expander. The ADM was then secured to itself over posterior surface of expander with 4-0 chromic suture. Redundant folds  acellular dermis excised so that the ADM lied flat without folds over air filled expander.The expander was secured to fascia over lateral sternal borderwith a0 vicryl. Thelateral tab was also secured to pectoralis muscle with0 vicryl. The ADM was secured to pectoralis muscle and chest wall along inferior border at inframammary fold with 0 V-lock suture. Skin closure completedwith 3-0 vicryl in fascial layer and 4-0 vicryl in dermis. Skin closure completed with 4-0 monocryl subcuticular and tissue adhesive.  The skin flaps were redraped so thatnipplewas symmetric from the sternal notch and midline. Transparent, adherent dressings applied. Dry dressing and breast binder applied.  The patient was allowed to wake from anesthesia, extubated and taken to the recovery room in satisfactory condition.   SPECIMENS: none  DRAINS: 15 and 19 Fr JP in right breast reconstruction  Irene Limbo, MD [facility] Plastic & Reconstructive Surgery [phone], pin [phone]

## 2017-08-08 NOTE — Interval H&P Note (Signed)
History and Physical Interval Note:  08/08/2017 7:24 AM  Lorraine Dean  has presented today for surgery, with the diagnosis of right breast cancer  The various methods of treatment have been discussed with the patient and family. After consideration of risks, benefits and other options for treatment, the patient has consented to  Procedure(s): RIGHT NIPPLE SPARING MASTECTOMY ERAS PATHWAY (Right) REMOVAL PORT-A-CATH (N/A) RIGHT BREAST RECONSTRUCTION WITH PLACEMENT OF TISSUE EXPANDER AND ALLODERM (Right) as a surgical intervention .  The patient's history has been reviewed, patient examined, no change in status, stable for surgery.  I have reviewed the patient's chart and labs.  Questions were answered to the patient's satisfaction.     TOTH III,Trenace Coughlin S

## 2017-08-08 NOTE — Anesthesia Postprocedure Evaluation (Signed)
Anesthesia Post Note  Patient: Lorraine Dean  Procedure(s) Performed: RIGHT NIPPLE SPARING MASTECTOMY ERAS PATHWAY (Right Breast) REMOVAL PORT-A-CATH (N/A ) RIGHT BREAST RECONSTRUCTION WITH PLACEMENT OF TISSUE EXPANDER AND ALLODERM (Right )     Patient location during evaluation: PACU Anesthesia Type: General Level of consciousness: awake and alert Pain management: pain level controlled Vital Signs Assessment: post-procedure vital signs reviewed and stable Respiratory status: spontaneous breathing, nonlabored ventilation, respiratory function stable and patient connected to nasal cannula oxygen Cardiovascular status: blood pressure returned to baseline and stable Postop Assessment: no apparent nausea or vomiting Anesthetic complications: no    Last Vitals:  Vitals:   08/08/17 1159 08/08/17 1200  BP:    Pulse: 69 72  Resp: 13   Temp:  (!) 36.2 C  SpO2: 99% 99%    Last Pain:  Vitals:   08/08/17 1210  TempSrc:   PainSc: 2                  [name] DAVID

## 2017-08-08 NOTE — H&P (Signed)
Lorraine Dean  Location: [facility] Patient #: [id number] DOB: [birth year] Single / Language: Lorraine Dean Molt / Race: White Female   History of Present Illness  The patient is a 52 year old female who presents for a follow-up for Breast cancer. The patient is a 52 year old Asian female who is about 5 months status post right breast lumpectomy and sentinel node mapping for a T1 cN0 right breast cancer. She was a triple negative with a Ki-67 of 5%. She has been receiving adjuvant chemotherapy. She recently felt a mass in the right breast. A follow-up mammogram showed a mass in the lower outer right breast as well as a new group of calcification in the inner right breast. The mass was biopsied and was a benign cyst. 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 Tumor markers are still pending. She has not received any radiation therapy yet.   Allergies  No Known Allergies  Allergies Reconciled   Medication History Mometasone Furoate (50MCG/ACT Suspension, Nasal) Active. Flonase (50MCG/ACT Suspension, Nasal as needed) Active. Albuterol (90MCG/ACT Aerosol Soln, Inhalation as needed) Active. Nasonex (50MCG/ACT Suspension, Nasal as needed) Active. Medications Reconciled    Review of Systems General Present- Fatigue and Night Sweats. Not Present- Appetite Loss, Chills, Fever, Weight Gain and Weight Loss. Skin Not Present- Change in Wart/Mole, Dryness, Hives, Jaundice, New Lesions, Non-Healing Wounds, Rash and Ulcer. HEENT Present- Wears glasses/contact lenses. Not Present- Earache, Hearing Loss, Hoarseness, Nose Bleed, Oral Ulcers, Ringing in the Ears, Seasonal Allergies, Sinus Pain, Sore Throat, Visual Disturbances and Yellow Eyes. Respiratory Not Present- Bloody sputum, Chronic Cough, Difficulty Breathing, Snoring and Wheezing. Breast Present- Breast Mass. Not Present- Breast Pain, Nipple Discharge and Skin Changes. Cardiovascular Not  Present- Chest Pain, Difficulty Breathing Lying Down, Leg Cramps, Palpitations, Rapid Heart Rate, Shortness of Breath and Swelling of Extremities. Gastrointestinal Not Present- Abdominal Pain, Bloating, Bloody Stool, Change in Bowel Habits, Chronic diarrhea, Constipation, Difficulty Swallowing, Excessive gas, Gets full quickly at meals, Hemorrhoids, Indigestion, Nausea, Rectal Pain and Vomiting. Female Genitourinary Present- Frequency and Urgency. Not Present- Nocturia, Painful Urination and Pelvic Pain. Neurological Not Present- Decreased Memory, Fainting, Headaches, Numbness, Seizures, Tingling, Tremor, Trouble walking and Weakness. Psychiatric Present- Depression. Not Present- Anxiety, Bipolar, Change in Sleep Pattern, Fearful and Frequent crying. Endocrine Not Present- Cold Intolerance, Excessive Hunger, Hair Changes, Heat Intolerance, Hot flashes and New Diabetes. Hematology Not Present- Blood Thinners, Easy Bruising, Excessive bleeding, Gland problems, HIV and Persistent Infections.  Vitals  Weight: 105 lb Height: 61in Body Surface Area: 1.44 m Body Mass Index: 19.84 kg/m  Temp.: 98.64F(Oral)  Pulse: 103 (Regular)  BP: 108/80 (Sitting, Left Arm, Standard)       Physical Exam  General Mental Status-Alert. General Appearance-Consistent with stated age. Hydration-Well hydrated. Voice-Normal.  Head and Neck Head-normocephalic, atraumatic with no lesions or palpable masses. Trachea-midline. Thyroid Gland Characteristics - normal size and consistency.  Eye Eyeball - Bilateral-Extraocular movements intact. Sclera/Conjunctiva - Bilateral-No scleral icterus.  Chest and Lung Exam Chest and lung exam reveals -quiet, even and easy respiratory effort with no use of accessory muscles and on auscultation, normal breath sounds, no adventitious sounds and normal vocal resonance. Inspection Chest Wall - Normal. Back - normal.  Breast Note: The lateral  right breast incision is healing nicely with no sign of infection or seroma. She has a fairly large bruise encompassing the central portion of the breast. There is no definite palpable mass in either breast. There is no palpable axillary, supraclavicular, or  cervical lymphadenopathy.   Cardiovascular Cardiovascular examination reveals -normal heart sounds, regular rate and rhythm with no murmurs and normal pedal pulses bilaterally.  Abdomen Inspection Inspection of the abdomen reveals - No Hernias. Skin - Scar - no surgical scars. Palpation/Percussion Palpation and Percussion of the abdomen reveal - Soft, Non Tender, No Rebound tenderness, No Rigidity (guarding) and No hepatosplenomegaly. Auscultation Auscultation of the abdomen reveals - Bowel sounds normal.  Neurologic Neurologic evaluation reveals -alert and oriented x 3 with no impairment of recent or remote memory. Mental Status-Normal.  Musculoskeletal Normal Exam - Left-Upper Extremity Strength Normal and Lower Extremity Strength Normal. Normal Exam - Right-Upper Extremity Strength Normal and Lower Extremity Strength Normal.  Lymphatic Head & Neck  General Head & Neck Lymphatics: Bilateral - Description - Normal. Axillary  General Axillary Region: Bilateral - Description - Normal. Tenderness - Non Tender. Femoral & Inguinal  Generalized Femoral & Inguinal Lymphatics: Bilateral - Description - Normal. Tenderness - Non Tender.    Assessment & Plan  MALIGNANT NEOPLASM OF UPPER-OUTER QUADRANT OF RIGHT BREAST IN FEMALE, ESTROGEN RECEPTOR NEGATIVE (C50.411) DUCTAL CARCINOMA IN SITU (DCIS) OF RIGHT BREAST (D05.11) Impression: The patient is about 5 months status post right breast lumpectomy for a triple negative breast cancer. She has been receiving adjuvant chemotherapy and a follow-up mammogram showed a small area of calcification in the inner right breast that was biopsied and has come back as ductal carcinoma in  situ. At this point she still has a strong desire to keep her breast. I think she is still a candidate for breast conservation. She will not need further node evaluation. I will discuss this with her medical and radiation oncologist to make sure that they agree as well as with the radiologist to see how large an area was involved. If it all looks favorable than we will proceed with right breast radioactive seed localized lumpectomy which can then be followed by radiation which she has not had yet.  She has elected for right nipple sparing mastectomy.

## 2017-08-08 NOTE — Anesthesia Procedure Notes (Signed)
Procedure Name: Intubation Date/Time: 08/08/2017 7:43 AM Performed by: Gwyndolyn Saxon, CRNA Pre-anesthesia Checklist: Patient identified, Emergency Drugs available, Suction available, Patient being monitored and Timeout performed Patient Re-evaluated:Patient Re-evaluated prior to induction Oxygen Delivery Method: Circle system utilized Preoxygenation: Pre-oxygenation with 100% oxygen Induction Type: IV induction Ventilation: Mask ventilation without difficulty Laryngoscope Size: Miller and 2 Grade View: Grade I Tube type: Oral Tube size: 7.0 mm Number of attempts: 1 Placement Confirmation: ETT inserted through vocal cords under direct vision,  positive ETCO2,  CO2 detector and breath sounds checked- equal and bilateral Secured at: 20 cm Tube secured with: Tape Dental Injury: Teeth and Oropharynx as per pre-operative assessment

## 2017-08-08 NOTE — Transfer of Care (Signed)
Immediate Anesthesia Transfer of Care Note  Patient: Lorraine Dean  Procedure(s) Performed: RIGHT NIPPLE SPARING MASTECTOMY ERAS PATHWAY (Right Breast) REMOVAL PORT-A-CATH (N/A ) RIGHT BREAST RECONSTRUCTION WITH PLACEMENT OF TISSUE EXPANDER AND ALLODERM (Right )  Patient Location: PACU  Anesthesia Type:General  Level of Consciousness: drowsy  Airway & Oxygen Therapy: Patient Spontanous Breathing and Patient connected to face mask oxygen  Post-op Assessment: Report given to RN and Post -op Vital signs reviewed and stable  Post vital signs: Reviewed and stable  Last Vitals:  Vitals Value Taken Time  BP 125/82 08/08/2017 10:33 AM  Temp    Pulse 68 08/08/2017 10:35 AM  Resp 18 08/08/2017 10:35 AM  SpO2 100 % 08/08/2017 10:35 AM  Vitals shown include unvalidated device data.  Last Pain:  Vitals:   08/08/17 0615  TempSrc: Oral  PainSc: 0-No pain      Patients Stated Pain Goal: 3 (82/51/89 8421)  Complications: No apparent anesthesia complications

## 2017-08-08 NOTE — H&P (Signed)
Subjective:     Patient ID: Lorraine Dean is a 52 y.o. female.  HPI  Here for breast reconstruction. Presented 2018 with screening detected calcifications spanning 1.5 cm. Biopsy with triple negative IDC. Underwent lumpectomy 12/2016 with pathology 1.2 cm IDC, broadly 0.1 cm to the posterior margin, 0/5 SLN.   Adjuvant chemotherapy completed this 2.8.19. Developed new mass adjacent to lumpectomy site during this time. Recent MMG with new calcification and biopsy with DCIS, ER/PR+. Sincel last visit had MRI which noted 2.6 cm of linear NME extending lateral and posterior to the biopsy site. Given this increased area and small breast size, Dr. Marlou Starks has recommended mastectomy.   Genetics negative.  Prior to lumpectomy B cup, desires larger. Has noted some loss volume post lumpectomy. Wt stable. 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 Lives alone.     Objective:   Physical Exam  Cardiovascular: Normal rate, regular rhythm and normal heart sounds.   Pulmonary/Chest: Effort normal and breath sounds normal.  Abdominal: Soft.  Skin:  Fitzpatrick 3   Left chest port Right<left volume No ptosis, right breast induration present over superior breast Right lateral breast radial scar fine line faded well SN to nipple R 20 L 21 cm BW R 14 (CW10 cm) L 15 cm Nipple to IMF R 6 L 6 cm    Assessment:     Right breast ca UOQ ER- s/p lumpectomy Adjuvant chemotherapy DCIS right breast    Plan:     Reviewed autologous vs implant based reconstruction. Reviewed incisions, drains, OR length, hospital stay and recovery for each. Discussed process of expansion and implant based risks including rupture, MRI surveillance for silicone implants, infection requiring surgery or removal, contracture. Discussed asymmetry one can expect with implant unilateral and natural breast on opposite.   Her friend's family had TRAM with Dr. Stephanie Coup. Reviewed TRAM vs DIEP. Reviewed latter would require treatment at tertiary  center. Discussed abdominal based complications including failure flap, abdominal bulge or hernia. If she elected for autologous, her abdominal volume in my opinion similar or smaller to present breast volume, and would not be able to achieve larger volume. Discussed changes with wt gain loss with regards to flap volume. Counseled there area some microsurgeons that will do neurotization DIEP but this does not mean she will have immediate sensation nor regain similar sensation as prior to surgery.  Counseled patient that if she places TE at time of mastectomy, this would offer benefit of preservation breast foot print/allow for NSM and she could elect for autologlous flap to fill over an implant in future. We reviewed the timing of surgery ideally would be in next few weeks as she finished chemotherapy 3 weeks ago. Counseled it make take several weeks to meet and schedule surgery with DIEP surgeon.   Reviewed SSM vs NSM, both will be asensate and not stimulate. Reviewed with both risks mastectomy flap necrosis requiring additional surgery.   Discussed dual plane vs prepectoral reconstruction. Reviewed latter will prevent animation, may have less pain, with risk more visible rippling. Discussed use of acellular dermis in reconstruction, cadaveric source. Discussed risks of this including in case infection may require removal, seroma.   Reviewed portfolio, examples of expanders and implants.   Patient desires to proceed with mastectomy and immediate reconstruction. Plan prepectoral right TE placement with NSM.  Irene Limbo, MD [facility] Plastic & Reconstructive Surgery [phone], pin [phone]

## 2017-08-08 NOTE — Progress Notes (Signed)
Pt arrived to unit around 1210 this afternoon, post surgery.  Pt was in her bed.  She did get OOB to BRP x2.  She sat in chair for Dinner this evening.  Education was provided to Pt on how to empty the JP drain, pt was able to demonstrate back how to empty 1 of the drains - suggested that Pt and RN/NT allow the Pt to empty her JP drain to get more familiar with emptying them. Pt encourage to walk this evening. Possible discharge tomorrow.

## 2017-08-08 NOTE — Op Note (Signed)
08/08/2017  9:58 AM  PATIENT:  Lorraine Dean  52 y.o. female  PRE-OPERATIVE DIAGNOSIS:  right breast cancer  POST-OPERATIVE DIAGNOSIS:  right breast cancer  PROCEDURE:  Procedure(s): RIGHT NIPPLE SPARING MASTECTOMY ERAS PATHWAY (Right) REMOVAL PORT-A-CATH (N/A)  SURGEON:  Surgeon(s) and Role: Panel 1:    * [name], MD - Primary  PHYSICIAN ASSISTANT:   ASSISTANTS: Judyann Munson, RNFA   ANESTHESIA:   general  EBL:  150 mL   BLOOD ADMINISTERED:none  DRAINS: none   LOCAL MEDICATIONS USED:  MARCAINE     SPECIMEN:  Source of Specimen:  right breast  DISPOSITION OF SPECIMEN:  PATHOLOGY  COUNTS:  YES  TOURNIQUET:  * No tourniquets in log *  DICTATION: .Dragon Dictation   After informed consent was obtained the patient was brought to the operating room and placed in the supine position on the operating table.  After adequate induction of general anesthesia the patient's bilateral chest, breast, and axillary areas were prepped with ChloraPrep, allowed to dry, and draped in usual sterile manner.  An appropriate timeout was performed.  Attention was first turned to the left chest wall.  The area around the port was infiltrated with quarter percent Marcaine.  A small incision was made with a 15 blade knife through her old incision.  The incision was carried through the subcutaneous tissue sharply with the 15 blade knife until the capsule surrounding the port was open.  The 2 anchoring stitches were divided and removed.  The port was then pushed out of its pocket and with gentle traction was removed from the patient without difficulty.  Pressure was held for several minutes until the area was completely hemostatic.  The deep layer of the wound was then closed with interrupted 3-0 Vicryl stitches.  The skin was then closed with a running 4-0 Monocryl subcuticular stitch.  Dermabond dressings were applied.  Attention was then turned to the right breast.  The patient had a  previous transversely oriented lateral incision.  She is now for nipple sparing mastectomy.  The old scar was removed sharply with a 15 blade knife.  Skin hooks were used to elevate the skin flaps anteriorly.  Blunt dissection was then carried out between the breast tissue in the subcutaneous fat with a Kelly clamp and the bridges were divided with the plasma blade.  Lighted retractors were then used once the dissection was carried away from the skin edge.  This dissection was carried out all the way across the anterior surface of the breast until the superior medial and inferior edges of the breast were identified.  Laterally the same dissection was carried to the chest wall.  Next the breast was removed from the pectoralis muscle with the pectoralis fascia.  A small piece of tissue behind the nipple was removed sharply with the Metzenbaum scissors and sent for frozen section which was benign-appearing.  The breast tissue where the nipple was was marked with a stitch.  Once the entire breast was removed from the patient it was oriented with the appropriate paint colors.  The specimen was then sent to pathology for further evaluation.  Hemostasis was achieved using the plasma blade.  The patient tolerated the procedure well.  At this point all needle sponge and instrument counts were correct.  The operation at this point was turned over to Dr. Ashley Mariner for the reconstruction.  Her portion will be dictated separately.  PLAN OF CARE: Admit for overnight observation  PATIENT DISPOSITION:  PACU -  hemodynamically stable.   Delay start of Pharmacological VTE agent (>24hrs) due to surgical blood loss or risk of bleeding: no

## 2017-08-08 NOTE — Anesthesia Preprocedure Evaluation (Signed)
Anesthesia Evaluation  Patient identified by MRN, date of birth, ID band Patient awake    Reviewed: Allergy & Precautions, NPO status , Patient's Chart, lab work & pertinent test results  History of Anesthesia Complications (+) PONV  Airway Mallampati: I  TM Distance: >3 FB Neck ROM: Full    Dental   Pulmonary    Pulmonary exam normal        Cardiovascular Normal cardiovascular exam     Neuro/Psych Anxiety Depression    GI/Hepatic   Endo/Other    Renal/GU      Musculoskeletal   Abdominal   Peds  Hematology   Anesthesia Other Findings   Reproductive/Obstetrics                             Anesthesia Physical Anesthesia Plan  ASA: II  Anesthesia Plan: General   Post-op Pain Management:  Regional for Post-op pain   Induction: Intravenous  PONV Risk Score and Plan: 4 or greater and Scopolamine patch - Pre-op, Ondansetron, Dexamethasone, Midazolam and Diphenhydramine  Airway Management Planned: Oral ETT  Additional Equipment:   Intra-op Plan:   Post-operative Plan: Extubation in OR  Informed Consent: I have reviewed the patients History and Physical, chart, labs and discussed the procedure including the risks, benefits and alternatives for the proposed anesthesia with the patient or authorized representative who has indicated his/her understanding and acceptance.     Plan Discussed with: CRNA and Surgeon  Anesthesia Plan Comments:         Anesthesia Quick Evaluation

## 2017-08-09 ENCOUNTER — Encounter (HOSPITAL_COMMUNITY): Payer: Self-pay | Admitting: General Surgery

## 2017-08-09 DIAGNOSIS — C50411 Malignant neoplasm of upper-outer quadrant of right female breast: Secondary | ICD-10-CM | POA: Diagnosis not present

## 2017-08-09 MED ORDER — ALUM & MAG HYDROXIDE-SIMETH 200-200-20 MG/5ML PO SUSP
30.0000 mL | Freq: Four times a day (QID) | ORAL | Status: DC | PRN
  Administered 2017-08-09: 30 mL via ORAL
  Filled 2017-08-09: qty 30

## 2017-08-09 MED ORDER — SULFAMETHOXAZOLE-TRIMETHOPRIM 400-80 MG PO TABS: 1.0000 | ORAL_TABLET | Freq: Two times a day (BID) | ORAL | 0 refills | Status: DC

## 2017-08-09 MED ORDER — HYDROCODONE-ACETAMINOPHEN 5-325 MG PO TABS: 1.0000 | ORAL_TABLET | Freq: Four times a day (QID) | ORAL | 0 refills | Status: DC | PRN

## 2017-08-09 NOTE — Progress Notes (Signed)
Pt complained of tightness around the chest, no SOB, VS are wnl, pt was given vicodin 5/325 1tab , then Robaxin and Maalox, after 30 mins pt verbalized a little bit better, reported to Dr. Brantley Stage  with order to give another pain medicine.

## 2017-08-09 NOTE — Discharge Summary (Signed)
Physician Discharge Summary  Patient ID: Lorraine Dean MRN: [id number] DOB/AGE: [birth year] 52 y.o.  Admit date: 08/08/2017 Discharge date: 08/09/2017  Admission Diagnoses: Right breast cancer UOQ  Discharge Diagnoses:  Active Problems:   Breast cancer of upper-outer quadrant of right female breast [facility])  Discharged Condition: stable  Hospital Course: Postoperatively patient had some reflux with associated chest pain. This resolved and pain otherwise well controlled. Ambulatory in room and tolerated diet. Instructed on wound and drain care.  Treatments: surgery: right nipple sparing mastectomy with tissue expander, acellular dermis reconstruction 4.4.19  Discharge Exam: Blood pressure (!) 105/55, pulse 60, temperature 98.6 F (37 C), temperature source Oral, resp. rate 16, SpO2 99 %. Incision/Wound: Chest soft Tegarderms in place skin flap viable drains serosanguinous  Disposition: Discharge disposition: 01-Home or Self Care       Discharge Instructions    Call MD for:  difficulty breathing, headache or visual disturbances   Complete by:  As directed    Call MD for:  extreme fatigue   Complete by:  As directed    Call MD for:  hives   Complete by:  As directed    Call MD for:  persistant dizziness or light-headedness   Complete by:  As directed    Call MD for:  persistant nausea and vomiting   Complete by:  As directed    Call MD for:  redness, tenderness, or signs of infection (pain, swelling, bleeding, redness, odor or green/yellow discharge around incision site)   Complete by:  As directed    Call MD for:  redness, tenderness, or signs of infection (pain, swelling, redness, odor or green/yellow discharge around incision site)   Complete by:  As directed    Call MD for:  severe uncontrolled pain   Complete by:  As directed    Call MD for:  temperature >100.4   Complete by:  As directed    Call MD for:  temperature >100.5   Complete by:  As directed    Diet - low  sodium heart healthy   Complete by:  As directed    Discharge instructions   Complete by:  As directed    Sponge bathe while drains are in. No overhead activity. Empty drains, record output, recharge bulb twice a day   Discharge instructions   Complete by:  As directed    k to remove dressings and shower am 4.6.19. Soap and water ok, pat Tegaderms dry. No not remove Tegaderms. No creams or ointments over incisions. Do not let drains dangle in shower, attach to lanyard or similar.Strip and record drains twice daily and bring log to clinic visit.  Breast binder or soft compression bra all other times.  Ok to raise arms above shoulders for bathing and dressing.  No house yard work or exercise until cleared by MD.   Recommend Miralax or Dulcolax as needed for constipation. Recommend ibuprofen or naproxen as directed to help with pain control. Ok to take home Ativan as directed for anxiety.   Driving Restrictions   Complete by:  As directed    No driving for 2 weeks, no driving if taking narcotics   Increase activity slowly   Complete by:  As directed    Lifting restrictions   Complete by:  As directed    No lifting > 5 lbs until cleared by MD   No wound care   Complete by:  As directed    Resume previous diet   Complete by:  As directed      Allergies as of 08/09/2017   No Known Allergies     Medication List    STOP taking these medications   dexamethasone 4 MG tablet Commonly known as:  DECADRON   lidocaine-prilocaine cream Commonly known as:  EMLA   ondansetron 8 MG tablet Commonly known as:  ZOFRAN     TAKE these medications   albuterol 108 (90 Base) MCG/ACT inhaler Commonly known as:  PROVENTIL HFA;VENTOLIN HFA Inhale 1-2 puffs into the lungs every 4 (four) hours as needed for wheezing or shortness of breath.   ALIGN PO Take 1 capsule by mouth every evening.   aspirin EC 81 MG tablet Take 81 mg by mouth 3 (three) times a week.   atorvastatin 10 MG  tablet Commonly known as:  LIPITOR Take 10 mg by mouth every evening.   CALCIUM 600+D3 PO Take 1 tablet by mouth every evening.   CHOLEST OFF PO Take 900 mg by mouth every evening.   GARCINIA CAMBOGIA-CHROMIUM PO Take 1 tablet by mouth 2 (two) times a week. Saturday & Sunday   HAIR/SKIN/NAILS PO Take 1 tablet by mouth every evening.   HYDROcodone-acetaminophen 5-325 MG tablet Commonly known as:  NORCO/VICODIN Take 1-2 tablets by mouth every 6 (six) hours as needed for moderate pain or severe pain.   Krill Oil 350 MG Caps Take 350 mg by mouth daily.   methocarbamol 750 MG tablet Commonly known as:  ROBAXIN Take 1 tablet (750 mg total) by mouth 4 (four) times daily as needed (use for muscle cramps/pain).   mometasone 50 MCG/ACT nasal spray Commonly known as:  NASONEX 2 SPRAYS IN EACH NOSTRIL ONCE A DAY IF NEEDED NASALLY   multivitamin with minerals Tabs tablet Take 1 tablet by mouth every evening.   naproxen sodium 220 MG tablet Commonly known as:  ALEVE Take 220-440 mg by mouth 2 (two) times daily as needed (for pain.).   OCUVITE ADULT FORMULA Caps Take by mouth.   OCUVITE PO Take 1 tablet by mouth every evening.   potassium chloride 10 MEQ tablet Commonly known as:  K-DUR Take 10 mEq by mouth daily as needed (for leg cramping.).   prochlorperazine 10 MG tablet Commonly known as:  COMPAZINE Take 1 tablet (10 mg total) by mouth every 6 (six) hours as needed (Nausea or vomiting).   sulfamethoxazole-trimethoprim 400-80 MG tablet Commonly known as:  BACTRIM Take 1 tablet by mouth 2 (two) times daily.   Vitamin D-3 5000 units Tabs Take 5,000 Units by mouth every evening.      Follow-up Information    Autumn Messing III, MD In 2 weeks.   Specialty:  General Surgery Contact information: [address] [place] [address] [phone]        Irene Limbo, MD In 1 week.   Specialty:  Plastic Surgery Why:  as scheduled Contact  information: [place] [address] [phone]           Signed: Irene Limbo 08/09/2017, 9:58 AM

## 2017-08-13 NOTE — Assessment & Plan Note (Signed)
12/28/2016: Right lumpectomy: IDC grade 2, 1.2 cm, broadly 0.1 cm to the posterior margin, 0/5 lymph nodes negative, ER 0%, PR 0%, HER-2 negative ratio 1.05, Ki-67 5%, T1 cN0 stage IB AJCC 8  Recommendation: 1. Adjuvant chemotherapy with dose dense Adriamycin and Cytoxan every 2 weeks 4 followed by weekly Taxol 12 completed 06/14/2017 2. followed by adjuvant radiation therapy ---------------------------------------------------------------------- New diagnosis of DCIS estrogen receptor positive: Patient would undergo surgery for the DCIS prior to radiation therapy  Return to clinic after radiation to begin antiestrogen therapy

## 2017-08-14 ENCOUNTER — Telehealth: Payer: Self-pay | Admitting: Hematology and Oncology

## 2017-08-14 ENCOUNTER — Inpatient Hospital Stay: Payer: BLUE CROSS/BLUE SHIELD | Attending: Hematology and Oncology | Admitting: Hematology and Oncology

## 2017-08-14 DIAGNOSIS — Z9011 Acquired absence of right breast and nipple: Secondary | ICD-10-CM | POA: Diagnosis not present

## 2017-08-14 DIAGNOSIS — C50411 Malignant neoplasm of upper-outer quadrant of right female breast: Secondary | ICD-10-CM | POA: Insufficient documentation

## 2017-08-14 DIAGNOSIS — Z79810 Long term (current) use of selective estrogen receptor modulators (SERMs): Secondary | ICD-10-CM | POA: Insufficient documentation

## 2017-08-14 DIAGNOSIS — Z170 Estrogen receptor positive status [ER+]: Secondary | ICD-10-CM | POA: Insufficient documentation

## 2017-08-14 MED ORDER — TAMOXIFEN CITRATE 20 MG PO TABS: 20.0000 mg | ORAL_TABLET | Freq: Every day | ORAL | 3 refills | Status: DC

## 2017-08-14 NOTE — Telephone Encounter (Signed)
Gave patient AVS and calendar of upcoming October appointments °

## 2017-08-14 NOTE — Progress Notes (Signed)
Patient Care Team: [name], MD as PCP - General (Family Medicine) Nicholas Lose, MD as Consulting Physician (Hematology and Oncology) [name], MD as Consulting Physician (General Surgery) Gery Pray, MD as Consulting Physician (Radiation Oncology)  DIAGNOSIS:  Encounter Diagnosis  Name Primary?  . Malignant neoplasm of upper-outer quadrant of right breast in female, estrogen receptor positive ([place])     SUMMARY OF ONCOLOGIC HISTORY:   Malignant neoplasm of upper-outer quadrant of right breast in female, estrogen receptor positive ([place])   12/05/2016 Initial Diagnosis    Screening detected calcifications Rt breast spanning 1.5 cm, U/S measured a lesion 1.2 cm size, Biopsy revealed IDC Grade 3, ER/PR Negative, Her 2 Neg Ratio: 1.05; Ki 67: 20%; T1bN0 Stage 1A Clinical stage      12/24/2016 Genetic Testing    The patient had genetic testing due to a personal and family history of breast cancer.  The [facility] Common Hereditary Cancer Panel + Melanoma panel was sent out. The Hereditary Gene Panel + Melanoma Panel offered by [facility] includes sequencing and/or deletion duplication testing of the following 53 genes: APC, ATM, AXIN2, BAP1, BARD1, BMPR1A, BRCA1, BRCA2, BRIP1, CDH1, CDK4, CDKN2A (p14ARF), CDKN2A (p16INK4a), CHEK2, CTNNA1, DICER1, EPCAM (Deletion/duplication testing only), GREM1 (promoter region deletion/duplication testing only), KIT, MC1R, MEN1, MLH1, MSH2, MSH3, MSH6, MUTYH, NBN, NF1, NHTL1, PALB2, PDGFRA, PMS2, POLD1, POLE, POT1, PTEN, RAD50, RAD51C, RAD51D, RB1, SDHB, SDHC, SDHD, SMAD4, SMARCA4. STK11, TERT, TP53, TSC1, TSC2, and VHL.  The following genes were evaluated for sequence changes only: MITF, SDHA and HOXB13 c.251G>A variant only.  Results: No pathogenic mutations identified. 3 VU'sS in BRCA1 c.2967T>A (p.Phe989Leu) , MSH6 c.2857G>A (p.Glu953Lys), and TERT c.902G>A (p.Arg301His) were identified.  The date of this test result is 12/24/2016.        12/28/2016  Surgery    Right lumpectomy: IDC grade 2, 1.2 cm, broadly 0.1 cm to the posterior margin, 0/5 lymph nodes negative, ER 0%, PR 0%, HER-2 negative ratio 1.05, Ki-67 5%, T1 cN0 stage IB AJCC 8      01/25/2017 - 06/14/2017 Chemotherapy    Adjuvant chemotherapy with dose dense Adriamycin and Cytoxan 4 followed by Taxol weekly 12       08/08/2017 Surgery    Right mastectomy: No evidence of residual breast cancer.       CHIEF COMPLIANT: Follow-up after recent right mastectomy  INTERVAL HISTORY: Lorraine Dean is a 52 year old with above-mentioned history of right breast cancer treated with adjuvant chemotherapy and was later found to have DCIS and for this she underwent right mastectomy.  She is here today to discuss the final pathology.  Mastectomy did not show any additional invasive or in situ breast cancers.  She is recovering from the surgeries and still has drain tubes in place.  She is accompanied today by with her sister.  REVIEW OF SYSTEMS:   Constitutional: Denies fevers, chills or abnormal weight loss Eyes: Denies blurriness of vision Ears, nose, mouth, throat, and face: Denies mucositis or sore throat Respiratory: Denies cough, dyspnea or wheezes Cardiovascular: Denies palpitation, chest discomfort Gastrointestinal:  Denies nausea, heartburn or change in bowel habits Skin: Denies abnormal skin rashes Lymphatics: Denies new lymphadenopathy or easy bruising Neurological:Denies numbness, tingling or new weaknesses Behavioral/Psych: Mood is stable, no new changes  Extremities: No lower extremity edema Breast: Recent right mastectomy All other systems were reviewed with the patient and are negative.  I have reviewed the past medical history, past surgical history, social history and family history with the  patient and they are unchanged from previous note.  ALLERGIES:  has No Known Allergies.  MEDICATIONS:  Current Outpatient Medications  Medication Sig Dispense Refill  .  albuterol (PROVENTIL HFA;VENTOLIN HFA) 108 (90 Base) MCG/ACT inhaler Inhale 1-2 puffs into the lungs every 4 (four) hours as needed for wheezing or shortness of breath.    Marland Kitchen aspirin EC 81 MG tablet Take 81 mg by mouth 3 (three) times a week.     Marland Kitchen atorvastatin (LIPITOR) 10 MG tablet Take 10 mg by mouth every evening.    . Biotin w/ Vitamins C & E (HAIR/SKIN/NAILS PO) Take 1 tablet by mouth every evening.    . Calcium Carb-Cholecalciferol (CALCIUM 600+D3 PO) Take 1 tablet by mouth every evening.    . Cholecalciferol (VITAMIN D-3) 5000 units TABS Take 5,000 Units by mouth every evening.    Marland Kitchen GARCINIA CAMBOGIA-CHROMIUM PO Take 1 tablet by mouth 2 (two) times a week. Saturday & Sunday    . HYDROcodone-acetaminophen (NORCO/VICODIN) 5-325 MG tablet Take 1-2 tablets by mouth every 6 (six) hours as needed for moderate pain or severe pain. 20 tablet 0  . Krill Oil 350 MG CAPS Take 350 mg by mouth daily.    . methocarbamol (ROBAXIN) 750 MG tablet Take 1 tablet (750 mg total) by mouth 4 (four) times daily as needed (use for muscle cramps/pain). 30 tablet 2  . mometasone (NASONEX) 50 MCG/ACT nasal spray 2 SPRAYS IN EACH NOSTRIL ONCE A DAY IF NEEDED NASALLY  2  . Multiple Vitamin (MULTIVITAMIN WITH MINERALS) TABS tablet Take 1 tablet by mouth every evening.    . Multiple Vitamins-Minerals (OCUVITE ADULT FORMULA) CAPS Take by mouth.    . Multiple Vitamins-Minerals (OCUVITE PO) Take 1 tablet by mouth every evening.    . naproxen sodium (ALEVE) 220 MG tablet Take 220-440 mg by mouth 2 (two) times daily as needed (for pain.).    Marland Kitchen Plant Sterols and Stanols (CHOLEST OFF PO) Take 900 mg by mouth every evening.    . potassium chloride (K-DUR) 10 MEQ tablet Take 10 mEq by mouth daily as needed (for leg cramping.).    Marland Kitchen Probiotic Product (ALIGN PO) Take 1 capsule by mouth every evening.    . prochlorperazine (COMPAZINE) 10 MG tablet Take 1 tablet (10 mg total) by mouth every 6 (six) hours as needed (Nausea or vomiting).  (Patient not taking: Reported on 07/24/2017) 30 tablet 1  . sulfamethoxazole-trimethoprim (BACTRIM) 400-80 MG tablet Take 1 tablet by mouth 2 (two) times daily. 12 tablet 0  . [START ON 09/13/2017] tamoxifen (NOLVADEX) 20 MG tablet Take 1 tablet (20 mg total) by mouth daily. 90 tablet 3   No current facility-administered medications for this visit.     PHYSICAL EXAMINATION: ECOG PERFORMANCE STATUS: 1 - Symptomatic but completely ambulatory  Vitals:   08/14/17 0939  BP: 107/67  Pulse: 87  Resp: 18  Temp: 98 F (36.7 C)  SpO2: 99%   Filed Weights   08/14/17 0939  Weight: 105 lb 14.4 oz (48 kg)    GENERAL:alert, no distress and comfortable SKIN: skin color, texture, turgor are normal, no rashes or significant lesions EYES: normal, Conjunctiva are pink and non-injected, sclera clear OROPHARYNX:no exudate, no erythema and lips, buccal mucosa, and tongue normal  NECK: supple, thyroid normal size, non-tender, without nodularity LYMPH:  no palpable lymphadenopathy in the cervical, axillary or inguinal LUNGS: clear to auscultation and percussion with normal breathing effort HEART: regular rate & rhythm and no murmurs and no lower  extremity edema ABDOMEN:abdomen soft, non-tender and normal bowel sounds MUSCULOSKELETAL:no cyanosis of digits and no clubbing  NEURO: alert & oriented x 3 with fluent speech, no focal motor/sensory deficits EXTREMITIES: No lower extremity edema  LABORATORY DATA:  I have reviewed the data as listed CMP Latest Ref Rng & Units 08/01/2017 06/14/2017 06/07/2017  Glucose 65 - 99 mg/dL 101(H) 85 108  BUN 6 - 20 mg/dL _0 Creatinine 0.44 - 1.00 mg/dL 0.65 0.64 0.73  Sodium 135 - 145 mmol/L 140 142 143  Potassium 3.5 - 5.1 mmol/L 4.2 3.7 3.5  Chloride 101 - 111 mmol/L 104 107 108  CO2 22 - 32 mmol/L _1 Calcium 8.9 - 10.3 mg/dL 9.2 9.3 9.3  Total Protein 6.4 - 8.3 g/dL - 6.4 6.5  Total Bilirubin 0.2 - 1.2 mg/dL - 0.2 0.2  Alkaline Phos 40 - 150 U/L - 70  71  AST 5 - 34 U/L - 23 24  ALT 0 - 55 U/L - 25 29    Lab Results  Component Value Date   WBC 7.3 08/01/2017   HGB 14.0 08/01/2017   HCT 42.6 08/01/2017   MCV 89.5 08/01/2017   PLT 184 08/01/2017   NEUTROABS 2.3 06/14/2017    ASSESSMENT & PLAN:  Malignant neoplasm of upper-outer quadrant of right breast in female, estrogen receptor positive ([place]) 12/28/2016: Right lumpectomy: IDC grade 2, 1.2 cm, broadly 0.1 cm to the posterior margin, 0/5 lymph nodes negative, ER 0%, PR 0%, HER-2 negative ratio 1.05, Ki-67 5%, T1 cN0 stage IB AJCC 8  Recommendation: 1. Adjuvant chemotherapy with dose dense Adriamycin and Cytoxan every 2 weeks 4 followed by weekly Taxol 12 completed 06/14/2017 2. followed by adjuvant radiation therapy ---------------------------------------------------------------------- New diagnosis of DCIS estrogen receptor positive: Patient had sought multiple opinions and decided to finally undergo mastectomy on 08/08/2017.  There was no residual invasive or in situ breast cancer.  Patient is extremely paranoid about breast cancer recurrence. She may need a mammogram of the contralateral breast in 6 months. I discussed with her that scans are not necessary for surveillance.  There indicated if she has any symptoms. But I suspect that she will get scans primarily because of her intense fear of breast cancer recurrence.  Treatment plan: Antiestrogen therapy with tamoxifen 20 mg daily until she is menopausal after which we can switch her to aromatase inhibitor therapy. We discussed the risks and benefits of tamoxifen. These include but not limited to insomnia, hot flashes, mood changes, vaginal dryness, and weight gain. Although rare, serious side effects including endometrial cancer, risk of blood clots were also discussed. We strongly believe that the benefits far outweigh the risks. Patient understands these risks and consented to starting treatment. Planned treatment duration is  10 years.   Return to clinic in 3 months for survivorship care plan.    No orders of the defined types were placed in this encounter.  The patient has a good understanding of the overall plan. she agrees with it. she will call with any problems that may develop before the next visit here.   Harriette Ohara, MD 08/14/17

## 2017-08-29 ENCOUNTER — Telehealth (HOSPITAL_COMMUNITY): Payer: Self-pay | Admitting: Physician Assistant

## 2017-08-29 NOTE — Telephone Encounter (Signed)
05/28/17 patient called back and stated that she wanted to wait until her chemo was over before scheduling test..RG 08/15/17 Patient had mastectomy on 08/08/17 EVD  She will be removed from the workqueue.

## 2017-09-04 ENCOUNTER — Ambulatory Visit: Payer: BLUE CROSS/BLUE SHIELD | Attending: Plastic Surgery | Admitting: Physical Therapy

## 2017-09-04 ENCOUNTER — Encounter: Payer: Self-pay | Admitting: Physical Therapy

## 2017-09-04 ENCOUNTER — Other Ambulatory Visit: Payer: Self-pay

## 2017-09-04 DIAGNOSIS — R293 Abnormal posture: Secondary | ICD-10-CM | POA: Diagnosis present

## 2017-09-04 DIAGNOSIS — M6281 Muscle weakness (generalized): Secondary | ICD-10-CM | POA: Insufficient documentation

## 2017-09-04 DIAGNOSIS — M25511 Pain in right shoulder: Secondary | ICD-10-CM | POA: Insufficient documentation

## 2017-09-04 DIAGNOSIS — R600 Localized edema: Secondary | ICD-10-CM | POA: Insufficient documentation

## 2017-09-04 DIAGNOSIS — M25611 Stiffness of right shoulder, not elsewhere classified: Secondary | ICD-10-CM | POA: Insufficient documentation

## 2017-09-04 NOTE — Therapy (Signed)
[place], [place], [address] Phone: [phone]   Fax:  [phone]  Physical Therapy Evaluation  Patient Details  Name: Lorraine Dean MRN: [id number] Date of Birth: 03-06-1966 Referring Provider: Thimmappa   Encounter Date: 09/04/2017  PT End of Session - 09/04/17 1205    Visit Number  1    Number of Visits  9    Date for PT Re-Evaluation  10/02/17    PT Start Time  3614 pt arrived late    PT Stop Time  0928    PT Time Calculation (min)  29 min    Activity Tolerance  Patient tolerated treatment well    Behavior During Therapy  [facility] for tasks assessed/performed       Past Medical History:  Diagnosis Date  . Anxiety   . Cancer [facility])    right breast cancer  . Chronic rhinitis   . Depression   . Family history of adverse reaction to anesthesia   . Family history of breast cancer 12/13/2016  . Family history of lung cancer 12/13/2016  . High cholesterol   . HPV in female   . Malignant neoplasm of upper-outer quadrant of right breast in female, estrogen receptor positive ([place]) 12/11/2016  . PONV (postoperative nausea and vomiting)   . Vertigo    last flare 2016    Past Surgical History:  Procedure Laterality Date  . BREAST BIOPSY Right 2018   triple negative IDC grade 3 tumor  . BREAST LUMPECTOMY WITH RADIOACTIVE SEED AND SENTINEL LYMPH NODE BIOPSY Right 12/28/2016   Procedure: RIGHT BREAST LUMPECTOMY WITH RADIOACTIVE SEED AND SENTINEL LYMPH NODE MAPPING ERAS PATHWAY;  Surgeon: [name], MD;  Location: [place];  Service: General;  Laterality: Right;  PEC BLOCK  . BREAST RECONSTRUCTION WITH PLACEMENT OF TISSUE EXPANDER AND ALLODERM Right 08/08/2017   Procedure: RIGHT BREAST RECONSTRUCTION WITH PLACEMENT OF TISSUE EXPANDER AND ALLODERM;  Surgeon: Irene Limbo, MD;  Location: [place];  Service: Plastics;  Laterality: Right;  . NIPPLE SPARING MASTECTOMY Right 08/08/2017    Procedure: RIGHT NIPPLE SPARING MASTECTOMY ERAS PATHWAY;  Surgeon: [name], MD;  Location: [place];  Service: General;  Laterality: Right;  . PORT-A-CATH REMOVAL N/A 08/08/2017   Procedure: REMOVAL PORT-A-CATH;  Surgeon: [name], MD;  Location: [place];  Service: General;  Laterality: N/A;  . PORTACATH PLACEMENT Left 01/18/2017   Procedure: INSERTION PORT-A-CATH;  Surgeon: [name], MD;  Location: [place];  Service: General;  Laterality: Left;  . WISDOM TOOTH EXTRACTION      There were no vitals filed for this visit.   Subjective Assessment - 09/04/17 0902    Subjective  I was diagnosed with right breast cancer and underwent a lumpectomy on the right side with SLNB 01/2017. Then I had a new breast cancer in the right side and underwent a mastectomy 08/08/17 with no lymph nodes removed. Pt completed chemotherapy after the lumpectomy. Pt does not require radiation. I can barely left my right arm out to the side without it hurting.     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  Yes    Pain Score  4     Pain Location  Chest    Pain Orientation  Right    Pain Descriptors / Indicators  Sore;Sharp    Pain Type  Surgical pain    Pain Radiating Towards  n/a    Pain Onset  1 to 4 weeks ago    Pain Frequency  Constant    Aggravating Factors   rubbing against clothing    Pain Relieving Factors  when nothing is touching it    Effect of Pain on Daily Activities  gets very sensitive          [facility] PT Assessment - 09/04/17 0001      Assessment   Medical Diagnosis  right breast cancer x 2    Referring Provider  Thimmappa    Onset Date/Surgical Date   08/08/17    Hand Dominance  Right    Prior Therapy  none      Precautions   Precautions  Other (comment)    Precaution Comments  at risk for lymphedema      Restrictions   Weight Bearing Restrictions  No      Balance Screen   Has the patient fallen in the past 6 months  No    Has the patient had a decrease in activity level because of a fear of falling?   No    Is the patient reluctant to leave their home because of a fear of falling?   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, does a lot of reaching    Leisure  stationary bike at home does 20-30 min every other day      Cognition   Overall Cognitive Status  Within Functional Limits for tasks assessed      Observation/Other Assessments   Observations  some slight swelling at right trunk and along back, mastectomy scar healing well increased tightness along right pec    Skin Integrity  scar healing but intact      ROM / Strength   AROM / PROM / Strength  AROM      AROM   Overall AROM   Deficits    AROM Assessment Site  Shoulder    Right/Left Shoulder  Right;Left    Right Shoulder Flexion  108 Degrees    Right Shoulder ABduction  60 Degrees    Right Shoulder Internal Rotation  -- unable secondary to pain    Right Shoulder External Rotation  -- unable secondary to pain    Left Shoulder Flexion  176 Degrees    Left Shoulder ABduction  179 Degrees    Left Shoulder Internal Rotation  67 Degrees    Left Shoulder External Rotation  83 Degrees         LYMPHEDEMA/ONCOLOGY QUESTIONNAIRE - 09/04/17 1751  Type   Cancer Type  right breast cancer      Surgeries   Mastectomy Date  08/08/17    Lumpectomy Date  01/28/17    Sentinel Lymph Node Biopsy Date  01/28/17    Number Lymph Nodes Removed  5 all negative      Date Lymphedema/Swelling Started   Date  08/08/17      Treatment   Active Chemotherapy Treatment  No    Past Chemotherapy Treatment  Yes    Active Radiation Treatment  No    Past Radiation Treatment  No    Current Hormone Treatment  No will begin May 10    Past Hormone Therapy  No      What other symptoms do you have   Are you Having Heaviness or Tightness  Yes    Are you having Pain  Yes    Are you having pitting edema  No    Is it Hard or Difficult finding clothes that fit  No    Do you have infections  No    Is there Decreased scar mobility  Yes      Lymphedema Assessments   Lymphedema Assessments  Upper extremities      Right Upper Extremity Lymphedema   15 cm Proximal to Olecranon Process  26.5 cm    Olecranon Process  22 cm    15 cm Proximal to Ulnar Styloid Process  20.5 cm    Just Proximal to Ulnar Styloid Process  13.3 cm    Across Hand at [employer]  16 cm    At [place] of 2nd Digit  5.1 cm      Left Upper Extremity Lymphedema   15 cm Proximal to Olecranon Process  26 cm    Olecranon Process  21.5 cm    15 cm Proximal to Ulnar Styloid Process  19.3 cm    Just Proximal to Ulnar Styloid Process  13 cm    Across Hand at [employer]  16.1 cm    At [place] of 2nd Digit  5.1 cm          Quick Dash - 09/04/17 0001    Open a tight or new jar  Moderate difficulty    Do heavy household chores (wash walls, wash floors)  Severe difficulty    Carry a shopping bag or briefcase  Mild difficulty    Wash your back  Severe difficulty    Use a knife to cut food  Mild difficulty    Recreational activities in which you take some force or impact through your arm, shoulder, or hand (golf, hammering,  tennis)  Unable    During the past week, to what extent has your arm, shoulder or hand problem interfered with your normal social activities with family, friends, neighbors, or groups?  Quite a bit    During the past week, to what extent has your arm, shoulder or hand problem limited your work or other regular daily activities  Modererately    Arm, shoulder, or hand pain.  Moderate    Tingling (pins and needles) in your arm, shoulder, or hand  Moderate    Difficulty Sleeping  Severe difficulty    DASH Score  59.09 %        Objective measurements completed on examination: See above findings.                   PT Long Term Goals - 09/04/17 1212  PT LONG TERM GOAL #1   Title  Pt will be able to independently verbalize lymphedema risk reduction practices    Time  4    Period  Weeks    Status  New    Target Date  10/02/17      PT LONG TERM GOAL #2   Title  Pt will demonstrate 165 degrees of right shoulder flexion to allow her to reach items on shelf    Baseline  108    Time  4    Period  Weeks    Status  New    Target Date  10/02/17      PT LONG TERM GOAL #3   Title  Pt will demonstrate 165 degrees of right shoulder abduction to allow her to reach items out to sides    Baseline  60    Time  4    Period  Weeks    Status  New    Target Date  10/02/17      PT LONG TERM GOAL #4   Title  Pt will be independent in a home exercise program for continued strengthening and stretching    Time  4    Period  Weeks    Status  New    Target Date  10/02/17      PT LONG TERM GOAL #5   Title  Pt will report a 50% improvement in swelling in right lateral trunk to allow improved comfort    Time  4    Period  Weeks    Status  New    Target Date  10/02/17             Plan - 09/04/17 1206    Clinical Impression Statement  Pt presents to PT following a right mastectomy on 08/08/17 for treatment of R breast cancer. She underwent a right lumpectomy in 2018 and SLNB for  treatment of a different type of right breast cancer. She presents with extremely decreased right shoulder ROM and has difficulty reaching out with her arm due to increased tightness. She has an expander placed on the R and will undergo reconstruction at some point in the future. Her scar is healing well. She does demonstrate some increased edema at right lateral trunk that extends posteriorly. She would benefit from skilled PT services to increase right shoulder ROM, decrease post op edema and instruct pt in a home exercise program for continued strengthening and stretching.     History and Personal Factors relevant to plan of care:  pt is right handed, her job duties include lifting., she has a past history of breast cancer on right side with lumpectomy and SLNB    Clinical Presentation  Evolving    Clinical Presentation due to:  pt will still need to undergo reconstruction    Clinical Decision Making  Moderate    Rehab Potential  Good    Clinical Impairments Affecting Rehab Potential  previous hx of right breast cancer    PT Frequency  2x / week    PT Duration  4 weeks    PT Treatment/Interventions  ADLs/Self Care Home Management;Patient/family education;Manual lymph drainage;Manual techniques;Orthotic Fit/Training;Therapeutic activities;Therapeutic exercise;Passive range of motion;Scar mobilization;Taping;Vasopneumatic Device    PT Next Visit Plan  give lymph risk reduction handout, give dowel exercises, begin gentle AA/A/PROM to right shoulder    Consulted and Agree with Plan of Care  Patient       Patient will benefit from skilled therapeutic intervention in order  to improve the following deficits and impairments:  Increased fascial restricitons, Pain, Decreased scar mobility, Postural dysfunction, Decreased range of motion, Decreased strength, Impaired UE functional use, Increased edema  Visit Diagnosis: Stiffness of right shoulder, not elsewhere classified - Plan: PT plan of care  cert/re-cert  Acute pain of right shoulder - Plan: PT plan of care cert/re-cert  Muscle weakness (generalized) - Plan: PT plan of care cert/re-cert  Localized edema - Plan: PT plan of care cert/re-cert     Problem List Patient Active Problem List   Diagnosis Date Noted  . Breast cancer of upper-outer quadrant of right female breast ([place]) 08/08/2017  . Port catheter in place 01/25/2017  . Preoperative clearance 12/24/2016  . Palpitations 12/24/2016  . Hyperlipidemia 12/24/2016  . Family history of early CAD 12/24/2016  . Genetic testing 12/20/2016  . Family history of breast cancer 12/13/2016  . Family history of lung cancer 12/13/2016  . Malignant neoplasm of upper-outer quadrant of right breast in female, estrogen receptor positive ([place]) 12/11/2016    [place] 09/04/2017, 12:16 PM  [place], [place], [address] Phone: [phone]   Fax:  [phone]  Name: Lorraine Dean MRN: [id number] Date of Birth: 10-02-1965  Manus Gunning, PT 09/04/17 12:16 PM

## 2017-09-05 ENCOUNTER — Encounter: Payer: Self-pay | Admitting: Physical Therapy

## 2017-09-05 ENCOUNTER — Ambulatory Visit: Payer: BLUE CROSS/BLUE SHIELD | Admitting: Physical Therapy

## 2017-09-05 DIAGNOSIS — M25611 Stiffness of right shoulder, not elsewhere classified: Secondary | ICD-10-CM

## 2017-09-05 DIAGNOSIS — M25511 Pain in right shoulder: Secondary | ICD-10-CM

## 2017-09-05 NOTE — Patient Instructions (Signed)
Shoulder: Flexion (Supine)    With hands shoulder width apart, slowly lower dowel to floor behind head. Do not let elbows bend. Keep back flat. Hold _5-15___ seconds. Repeat __10__ times. Do _2-3___ sessions per day. CAUTION: Stretch slowly and gently.  Copyright  VHI. All rights reserved.  Shoulder: Abduction (Supine)    With right arm flat on floor, hold dowel in palm. Slowly move arm up to side of head by pushing with opposite arm. Do not let elbow bend. Hold _5-15___ seconds. Repeat _10___ times. Do _2-3___ sessions per day. CAUTION: Stretch slowly and gently.  Copyright  VHI. All rights reserved.

## 2017-09-05 NOTE — Therapy (Signed)
[place], [place], [address] Phone: [phone]   Fax:  [phone]  Physical Therapy Treatment  Patient Details  Name: Lorraine Dean MRN: [id number] Date of Birth: Sep 18, 1965 Referring Provider: Thimmappa   Encounter Date: 09/05/2017  PT End of Session - 09/05/17 1543    Visit Number  2    Number of Visits  9    Date for PT Re-Evaluation  10/02/17    PT Start Time  5809    PT Stop Time  1540    PT Time Calculation (min)  48 min    Activity Tolerance  Patient tolerated treatment well    Behavior During Therapy  [facility] for tasks assessed/performed       Past Medical History:  Diagnosis Date  . Anxiety   . Cancer [facility])    right breast cancer  . Chronic rhinitis   . Depression   . Family history of adverse reaction to anesthesia   . Family history of breast cancer 12/13/2016  . Family history of lung cancer 12/13/2016  . High cholesterol   . HPV in female   . Malignant neoplasm of upper-outer quadrant of right breast in female, estrogen receptor positive ([place]) 12/11/2016  . PONV (postoperative nausea and vomiting)   . Vertigo    last flare 2016    Past Surgical History:  Procedure Laterality Date  . BREAST BIOPSY Right 2018   triple negative IDC grade 3 tumor  . BREAST LUMPECTOMY WITH RADIOACTIVE SEED AND SENTINEL LYMPH NODE BIOPSY Right 12/28/2016   Procedure: RIGHT BREAST LUMPECTOMY WITH RADIOACTIVE SEED AND SENTINEL LYMPH NODE MAPPING ERAS PATHWAY;  Surgeon: [name], MD;  Location: [place];  Service: General;  Laterality: Right;  PEC BLOCK  . BREAST RECONSTRUCTION WITH PLACEMENT OF TISSUE EXPANDER AND ALLODERM Right 08/08/2017   Procedure: RIGHT BREAST RECONSTRUCTION WITH PLACEMENT OF TISSUE EXPANDER AND ALLODERM;  Surgeon: Irene Limbo, MD;  Location: [place];  Service: Plastics;  Laterality: Right;  . NIPPLE SPARING MASTECTOMY Right 08/08/2017   Procedure: RIGHT NIPPLE  SPARING MASTECTOMY ERAS PATHWAY;  Surgeon: [name], MD;  Location: [place];  Service: General;  Laterality: Right;  . PORT-A-CATH REMOVAL N/A 08/08/2017   Procedure: REMOVAL PORT-A-CATH;  Surgeon: [name], MD;  Location: [place];  Service: General;  Laterality: N/A;  . PORTACATH PLACEMENT Left 01/18/2017   Procedure: INSERTION PORT-A-CATH;  Surgeon: [name], MD;  Location: [place];  Service: General;  Laterality: Left;  . WISDOM TOOTH EXTRACTION      There were no vitals filed for this visit.  Subjective Assessment - 09/05/17 1455    Subjective  My shoulder is still tight.     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  No/denies    Pain Score  0-No pain            LYMPHEDEMA/ONCOLOGY QUESTIONNAIRE - 09/04/17 0918      Type   Cancer Type  right breast cancer      Surgeries   Mastectomy Date  08/08/17    Lumpectomy Date  01/28/17    Sentinel Lymph Node Biopsy Date  01/28/17    Number Lymph Nodes Removed  5 all negative      Date Lymphedema/Swelling Started   Date  08/08/17      Treatment   Active Chemotherapy Treatment  No    Past Chemotherapy Treatment  Yes    Active Radiation Treatment  No    Past Radiation Treatment  No    Current Hormone Treatment  No will begin May 10    Past Hormone Therapy  No      What other symptoms do you have   Are you Having Heaviness or Tightness  Yes    Are you having Pain  Yes    Are you having pitting edema  No    Is it Hard or Difficult finding clothes that fit  No    Do you have infections  No    Is there Decreased scar mobility  Yes      Lymphedema Assessments   Lymphedema  Assessments  Upper extremities      Right Upper Extremity Lymphedema   15 cm Proximal to Olecranon Process  26.5 cm    Olecranon Process  22 cm    15 cm Proximal to Ulnar Styloid Process  20.5 cm    Just Proximal to Ulnar Styloid Process  13.3 cm    Across Hand at [employer]  16 cm    At [place] of 2nd Digit  5.1 cm      Left Upper Extremity Lymphedema   15 cm Proximal to Olecranon Process  26 cm    Olecranon Process  21.5 cm    15 cm Proximal to Ulnar Styloid Process  19.3 cm    Just Proximal to Ulnar Styloid Process  13 cm    Across Hand at [employer]  16.1 cm    At [place] of 2nd Digit  5.1 cm                [facility] Adult PT Treatment/Exercise - 09/05/17 0001      Exercises   Exercises  Shoulder      Shoulder Exercises: Supine   Flexion  AAROM;Both;10 reps w/dowel, 5 sec holds, return demo    ABduction  AAROM;Right;10 reps with dowel 5 sec holds, returned demo      Manual Therapy   Manual Therapy  Passive ROM    Passive ROM  to right shoulder gently into flexion, abduction and ER to pt's tolerance, gentle pulling throughout flexion to decrease shoulder popping             PT Education - 09/05/17 1546    Education provided  Yes    Education Details  lymphedema risk reduction handout    Person(s) Educated  Patient    Methods  Explanation;Handout    Comprehension  Verbalized understanding          PT Long Term Goals - 09/04/17 1212      PT LONG TERM GOAL #1   Title  Pt will be able to independently verbalize lymphedema risk reduction practices    Time  4    Period  Weeks    Status  New    Target Date  10/02/17      PT LONG TERM GOAL #2   Title  Pt will demonstrate 165 degrees of right shoulder flexion to allow her to reach items on shelf    Baseline  108    Time  4    Period  Weeks    Status  New    Target Date  10/02/17      PT LONG TERM GOAL #3   Title  Pt will demonstrate 165 degrees of right shoulder abduction to allow her to reach  items out to sides    Baseline  60    Time  4    Period  Weeks    Status  New    Target Date  10/02/17      PT LONG TERM GOAL #4   Title  Pt will be independent in a home exercise program for continued strengthening and stretching    Time  4    Period  Weeks    Status  New    Target Date  10/02/17      PT LONG TERM GOAL #5   Title  Pt will report a 50% improvement in swelling in right lateral trunk to allow improved comfort    Time  4    Period  Weeks    Status  New    Target Date  10/02/17            Plan - 09/05/17 1544    Clinical Impression Statement  Began gentle PROM to R shoulder in direction of flexion, abduction and ER to pt's tolerance. Also instructed pt in supine dowel exercises to begin doing at home to improve right shoulder ROM. 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 Also educated pt in lymphedema risk reduction practices.     Rehab Potential  Good    Clinical Impairments Affecting Rehab Potential  previous hx of right breast cancer    PT Frequency  2x / week    PT Duration  4 weeks    PT Treatment/Interventions  ADLs/Self Care Home Management;Patient/family education;Manual lymph drainage;Manual techniques;Orthotic Fit/Training;Therapeutic activities;Therapeutic exercise;Passive range of motion;Scar mobilization;Taping;Vasopneumatic Device    PT Next Visit Plan  continue gentle AA/A/PROM To right shoulder, add pulleys and ball next session    PT Home Exercise Plan  supine dowel exercises    Consulted and Agree with Plan of Care  Patient       Patient will benefit from skilled therapeutic intervention in order to improve the following deficits and impairments:  Increased fascial restricitons, Pain, Decreased scar mobility, Postural dysfunction, Decreased range of motion, Decreased strength, Impaired UE functional use, Increased edema  Visit Diagnosis: Stiffness of right shoulder, not elsewhere classified  Acute pain of right  shoulder     Problem List Patient Active Problem List   Diagnosis Date Noted  . Breast cancer of upper-outer quadrant of right female breast ([place]) 08/08/2017  . Port catheter in place 01/25/2017  . Preoperative clearance 12/24/2016  . Palpitations 12/24/2016  . Hyperlipidemia 12/24/2016  . Family history of early CAD 12/24/2016  . Genetic testing 12/20/2016  . Family history of breast cancer 12/13/2016  . Family history of lung cancer 12/13/2016  . Malignant neoplasm of upper-outer quadrant of right breast in female, estrogen receptor positive ([place]) 12/11/2016    Allyson Sabal [facility] 09/05/2017, 3:47 PM  [place], [place], [address] Phone: [phone]   Fax:  [phone]  Name: FLORINE SPRENKLE MRN: [id number] Date of Birth: 1965-12-02  Manus Gunning, PT 09/05/17 3:48 PM

## 2017-09-10 ENCOUNTER — Encounter: Payer: Self-pay | Admitting: Physical Therapy

## 2017-09-10 ENCOUNTER — Ambulatory Visit: Payer: BLUE CROSS/BLUE SHIELD | Admitting: Physical Therapy

## 2017-09-10 DIAGNOSIS — M25611 Stiffness of right shoulder, not elsewhere classified: Secondary | ICD-10-CM | POA: Diagnosis not present

## 2017-09-10 DIAGNOSIS — M6281 Muscle weakness (generalized): Secondary | ICD-10-CM

## 2017-09-10 DIAGNOSIS — R600 Localized edema: Secondary | ICD-10-CM

## 2017-09-10 DIAGNOSIS — M25511 Pain in right shoulder: Secondary | ICD-10-CM

## 2017-09-10 NOTE — Therapy (Signed)
[place], [place], [address] Phone: [phone]   Fax:  [phone]  Physical Therapy Treatment  Patient Details  Name: Lorraine Dean MRN: [id number] Date of Birth: Aug 29, 1965 Referring Provider: Thimmappa   Encounter Date: 09/10/2017  PT End of Session - 09/10/17 1929    Visit Number  3    Number of Visits  9    Date for PT Re-Evaluation  10/02/17    PT Start Time  1430    PT Stop Time  1515    PT Time Calculation (min)  45 min    Activity Tolerance  Patient tolerated treatment well    Behavior During Therapy  [facility] for tasks assessed/performed       Past Medical History:  Diagnosis Date  . Anxiety   . Cancer [facility])    right breast cancer  . Chronic rhinitis   . Depression   . Family history of adverse reaction to anesthesia   . Family history of breast cancer 12/13/2016  . Family history of lung cancer 12/13/2016  . High cholesterol   . HPV in female   . Malignant neoplasm of upper-outer quadrant of right breast in female, estrogen receptor positive ([place]) 12/11/2016  . PONV (postoperative nausea and vomiting)   . Vertigo    last flare 2016    Past Surgical History:  Procedure Laterality Date  . BREAST BIOPSY Right 2018   triple negative IDC grade 3 tumor  . BREAST LUMPECTOMY WITH RADIOACTIVE SEED AND SENTINEL LYMPH NODE BIOPSY Right 12/28/2016   Procedure: RIGHT BREAST LUMPECTOMY WITH RADIOACTIVE SEED AND SENTINEL LYMPH NODE MAPPING ERAS PATHWAY;  Surgeon: [name], MD;  Location: [place];  Service: General;  Laterality: Right;  PEC BLOCK  . BREAST RECONSTRUCTION WITH PLACEMENT OF TISSUE EXPANDER AND ALLODERM Right 08/08/2017   Procedure: RIGHT BREAST RECONSTRUCTION WITH PLACEMENT OF TISSUE EXPANDER AND ALLODERM;  Surgeon: Irene Limbo, MD;  Location: [place];  Service: Plastics;  Laterality: Right;  . NIPPLE SPARING MASTECTOMY Right 08/08/2017   Procedure: RIGHT NIPPLE  SPARING MASTECTOMY ERAS PATHWAY;  Surgeon: [name], MD;  Location: [place];  Service: General;  Laterality: Right;  . PORT-A-CATH REMOVAL N/A 08/08/2017   Procedure: REMOVAL PORT-A-CATH;  Surgeon: [name], MD;  Location: [place];  Service: General;  Laterality: N/A;  . PORTACATH PLACEMENT Left 01/18/2017   Procedure: INSERTION PORT-A-CATH;  Surgeon: [name], MD;  Location: [place];  Service: General;  Laterality: Left;  . WISDOM TOOTH EXTRACTION      There were no vitals filed for this visit.  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  Yes    Pain Score  4     Pain Location  Chest    Pain  Descriptors / Indicators  Sore                       [facility] Adult PT Treatment/Exercise - 09/10/17 0001      Shoulder Exercises: Supine   Protraction  AROM;Right;10 reps    Flexion  AAROM;Both;5 reps with dowel    ABduction  AAROM;Both;5 reps with dowel      Shoulder Exercises: Seated   Flexion  AAROM with UE ranger and hand on yellow ball rollinf forward onmat      Shoulder Exercises: Sidelying   External Rotation  AROM;Right;10 reps towel roll at waist, unable to tolerate sidelying long     ABduction  AROM;Right;5 reps initial range with long arc for deltoid strength      Shoulder Exercises: Standing   Retraction  AROM;Both;5 reps      Shoulder Exercises: Pulleys    Flexion  2 minutes cues to keep shoulders away from ears    ABduction  2 minutes cues to keep shoulders away from ears      Shoulder Exercises: ROM/Strengthening   Wall Wash  with towel for right arm to 12, 2 and 3 oclock on wall  reps with 3 second stretch       Shoulder Exercises: Stretch   Table Stretch - Flexion  5 reps;10 seconds with RUE on yellow ball       Manual Therapy   Manual Therapy  Manual Lymphatic Drainage (MLD);Passive ROM    Manual Lymphatic Drainage (MLD)  brief manual lymph drainage to right back while she was in left sidelying     Passive ROM  to right shoulder into flexion, abduction, horizontal abduction and external rotation to tolerance.  Pt grimacing at end range and is guarding against increased movement                  PT Long Term Goals - 09/04/17 1212      PT LONG TERM GOAL #1   Title  Pt will be able to independently verbalize lymphedema risk reduction practices    Time  4    Period  Weeks    Status  New    Target Date  10/02/17      PT LONG TERM GOAL #2   Title  Pt will demonstrate 165 degrees of right shoulder flexion to allow her to reach items on shelf    Baseline  108    Time  4    Period  Weeks    Status  New    Target Date  10/02/17      PT LONG TERM GOAL #3   Title  Pt will demonstrate 165 degrees of right shoulder abduction to allow her to reach items out to sides    Baseline  60    Time  4    Period  Weeks    Status  New    Target Date  10/02/17      PT LONG TERM GOAL #4   Title  Pt will be independent in a home exercise program for continued strengthening and stretching    Time  4    Period  Weeks    Status  New    Target Date  10/02/17      PT LONG TERM GOAL #5   Title  Pt will report a 50% improvement in swelling in right lateral trunk to allow improved comfort    Time  4    Period  Weeks    Status  New    Target Date  10/02/17            Plan - 09/10/17 1929    Clinical Impression Statement  Pt is  still limited in ROM by pain and decreased strength in lifing arms overhead, but was able to tolerate exercise today.  Upgraded HEP to add wall stretches.     Rehab Potential  Good    Clinical Impairments Affecting Rehab Potential  previous hx of right breast cancer    PT Frequency  2x / week    PT Duration  4 weeks    PT Treatment/Interventions  ADLs/Self Care Home Management;Patient/family education;Manual lymph drainage;Manual techniques;Orthotic Fit/Training;Therapeutic activities;Therapeutic exercise;Passive range of motion;Scar mobilization;Taping;Vasopneumatic Device    PT Next Visit Plan  continue gentle AA/A/PROM To right shoulder, with pulleys and ball on wall.  Encourage active exercise with increased repetitions at this time for strength     Consulted and Agree with Plan of Care  Patient       Patient will benefit from skilled therapeutic intervention in order to improve the following deficits and impairments:  Increased fascial restricitons, Pain, Decreased scar mobility, Postural dysfunction, Decreased range of motion, Decreased strength, Impaired UE functional use, Increased edema  Visit Diagnosis: Stiffness of right shoulder, not elsewhere classified  Acute pain of right shoulder  Muscle weakness (generalized)  Localized edema     Problem List Patient Active Problem List   Diagnosis Date Noted  . Breast cancer of upper-outer quadrant of right female breast ([place]) 08/08/2017  . Port catheter in place 01/25/2017  . Preoperative clearance 12/24/2016  . Palpitations 12/24/2016  . Hyperlipidemia 12/24/2016  . Family history of early CAD 12/24/2016  . Genetic testing 12/20/2016  . Family history of breast cancer 12/13/2016  . Family history of lung cancer 12/13/2016  . Malignant neoplasm of upper-outer quadrant of right breast in female, estrogen receptor positive ([place]) 12/11/2016   Donato Heinz. Owens Shark PT  [place] Levo 09/10/2017, 7:35 PM  [place], [place], [address] Phone: [phone]   Fax:  [phone]  Name: Lorraine Dean MRN: [id number] Date of Birth: January 08, 1966

## 2017-09-12 ENCOUNTER — Encounter: Payer: Self-pay | Admitting: Physical Therapy

## 2017-09-12 ENCOUNTER — Ambulatory Visit: Payer: BLUE CROSS/BLUE SHIELD | Admitting: Physical Therapy

## 2017-09-12 DIAGNOSIS — M25611 Stiffness of right shoulder, not elsewhere classified: Secondary | ICD-10-CM | POA: Diagnosis not present

## 2017-09-12 DIAGNOSIS — R600 Localized edema: Secondary | ICD-10-CM

## 2017-09-12 DIAGNOSIS — M25511 Pain in right shoulder: Secondary | ICD-10-CM

## 2017-09-12 DIAGNOSIS — M6281 Muscle weakness (generalized): Secondary | ICD-10-CM

## 2017-09-12 DIAGNOSIS — R293 Abnormal posture: Secondary | ICD-10-CM

## 2017-09-12 NOTE — Therapy (Signed)
[place], [place], [address] Phone: [phone]   Fax:  [phone]  Physical Therapy Treatment  Patient Details  Name: Lorraine Dean MRN: [id number] Date of Birth: Apr 09, 1966 Referring Provider: Thimmappa   Encounter Date: 09/12/2017  PT End of Session - 09/12/17 1729    Visit Number  4    Number of Visits  9    Date for PT Re-Evaluation  10/02/17    PT Start Time  1430    PT Stop Time  1515    PT Time Calculation (min)  45 min    Activity Tolerance  Patient tolerated treatment well    Behavior During Therapy  [facility] for tasks assessed/performed       Past Medical History:  Diagnosis Date  . Anxiety   . Cancer [facility])    right breast cancer  . Chronic rhinitis   . Depression   . Family history of adverse reaction to anesthesia   . Family history of breast cancer 12/13/2016  . Family history of lung cancer 12/13/2016  . High cholesterol   . HPV in female   . Malignant neoplasm of upper-outer quadrant of right breast in female, estrogen receptor positive ([place]) 12/11/2016  . PONV (postoperative nausea and vomiting)   . Vertigo    last flare 2016    Past Surgical History:  Procedure Laterality Date  . BREAST BIOPSY Right 2018   triple negative IDC grade 3 tumor  . BREAST LUMPECTOMY WITH RADIOACTIVE SEED AND SENTINEL LYMPH NODE BIOPSY Right 12/28/2016   Procedure: RIGHT BREAST LUMPECTOMY WITH RADIOACTIVE SEED AND SENTINEL LYMPH NODE MAPPING ERAS PATHWAY;  Surgeon: [name], MD;  Location: [place];  Service: General;  Laterality: Right;  PEC BLOCK  . BREAST RECONSTRUCTION WITH PLACEMENT OF TISSUE EXPANDER AND ALLODERM Right 08/08/2017   Procedure: RIGHT BREAST RECONSTRUCTION WITH PLACEMENT OF TISSUE EXPANDER AND ALLODERM;  Surgeon: Irene Limbo, MD;  Location: [place];  Service: Plastics;  Laterality: Right;  . NIPPLE SPARING MASTECTOMY Right 08/08/2017   Procedure: RIGHT NIPPLE  SPARING MASTECTOMY ERAS PATHWAY;  Surgeon: [name], MD;  Location: [place];  Service: General;  Laterality: Right;  . PORT-A-CATH REMOVAL N/A 08/08/2017   Procedure: REMOVAL PORT-A-CATH;  Surgeon: [name], MD;  Location: [place];  Service: General;  Laterality: N/A;  . PORTACATH PLACEMENT Left 01/18/2017   Procedure: INSERTION PORT-A-CATH;  Surgeon: [name], MD;  Location: [place];  Service: General;  Laterality: Left;  . WISDOM TOOTH EXTRACTION      There were no vitals filed for this visit.  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  Yes    Pain Score  5     Pain Location  Chest below expander     Pain Orientation  Right    Pain Descriptors / Indicators  Sore    Pain Type  Surgical pain  [facility] Adult PT Treatment/Exercise - 09/12/17 0001      Exercises   Exercises  Lumbar      Lumbar Exercises: Stretches   Other Lumbar Stretch Exercise  lower trunk rotation       Lumbar Exercises: Supine   Bridge  5 reps      Knee/Hip Exercises: Standing   Forward Step Up  Right;Left;10 reps    Other Standing Knee Exercises  backward step ups to 6 inch steps wtih 2 dowel rods for balance  10 reps with each leg       Shoulder Exercises: Supine   Protraction  AROM;Both;10 reps    Flexion  AROM;Both;5 reps      Shoulder Exercises: Standing   Retraction  Strengthening;Right;Left;10 reps;Theraband    Theraband Level (Shoulder Retraction)  Level 3 (Green)    Other Standing  Exercises  dowel rod flexion, abduciton and extension in front of mirror with cues to keep shoulder down away from ears  tactile cues at scpulae to assure they move symmetrically       Shoulder Exercises: Pulleys   Flexion  2 minutes    ABduction  2 minutes pt slow and controlled to feel a stretch in her sides       Shoulder Exercises: Therapy Ball   Flexion  Both;10 reps yellow ball, deep stretch at the top       Shoulder Exercises: ROM/Strengthening   Wall Wash  with towel for right arm to 12, 2 and 3 oclock on wall  reps with 3 second stretch     Other ROM/Strengthening Exercises  wall chest stretch       Shoulder Exercises: Isometric Strengthening   Flexion  5X5"    Extension  5X5"    ABduction  5X5"      Shoulder Exercises: Stretch   Wall Stretch - Flexion  2 reps;10 seconds    Wall Stretch - ABduction  2 reps;10 seconds      Manual Therapy   Passive ROM  to right shoulder into flexion, abduction, horizontal abduction and external rotation to tolerance.  Pt grimacing at end range less guarding today                   PT Long Term Goals - 09/04/17 1212      PT LONG TERM GOAL #1   Title  Pt will be able to independently verbalize lymphedema risk reduction practices    Time  4    Period  Weeks    Status  New    Target Date  10/02/17      PT LONG TERM GOAL #2   Title  Pt will demonstrate 165 degrees of right shoulder flexion to allow her to reach items on shelf    Baseline  108    Time  4    Period  Weeks    Status  New    Target Date  10/02/17      PT LONG TERM GOAL #3   Title  Pt will demonstrate 165 degrees of right shoulder abduction to allow her to reach items out to sides    Baseline  60    Time  4    Period  Weeks    Status  New    Target Date  10/02/17      PT LONG TERM GOAL #4   Title  Pt will be independent in a home exercise program for continued strengthening and stretching    Time  4    Period  Weeks    Status  New    Target Date   10/02/17      PT LONG TERM GOAL #5   Title  Pt will report a 50% improvement in swelling in right lateral trunk to allow improved comfort    Time  4    Period  Weeks    Status  New    Target Date  10/02/17            Plan - 09/12/17 1730    Clinical Impression Statement  pt reports some "tugging" at inferior breast with exercise ,but was able to stop stretches at a point to get a stretch but not pain.  She did well with exercise today and was able to progress to isometrics and muscle strenthening Pt improved symmetry of scapular movment with verbal and visual cues and her cogitive effort     Rehab Potential  Good    Clinical Impairments Affecting Rehab Potential  previous hx of right breast cancer    PT Frequency  2x / week    PT Duration  4 weeks    PT Treatment/Interventions  ADLs/Self Care Home Management;Patient/family education;Manual lymph drainage;Manual techniques;Orthotic Fit/Training;Therapeutic activities;Therapeutic exercise;Passive range of motion;Scar mobilization;Taping;Vasopneumatic Device    PT Next Visit Plan  continue with ROM stretching and strengtheing to UE and core. consider supine scap series next session  Encourage active exercise progression to 3 way arm raises with weights .     PT Home Exercise Plan  supine dowel exercises, wall stretches     Consulted and Agree with Plan of Care  Patient       Patient will benefit from skilled therapeutic intervention in order to improve the following deficits and impairments:  Increased fascial restricitons, Pain, Decreased scar mobility, Postural dysfunction, Decreased range of motion, Decreased strength, Impaired UE functional use, Increased edema  Visit Diagnosis: Stiffness of right shoulder, not elsewhere classified  Acute pain of right shoulder  Muscle weakness (generalized)  Localized edema  Abnormal posture     Problem List Patient Active Problem List   Diagnosis Date Noted  . Breast cancer of  upper-outer quadrant of right female breast ([place]) 08/08/2017  . Port catheter in place 01/25/2017  . Preoperative clearance 12/24/2016  . Palpitations 12/24/2016  . Hyperlipidemia 12/24/2016  . Family history of early CAD 12/24/2016  . Genetic testing 12/20/2016  . Family history of breast cancer 12/13/2016  . Family history of lung cancer 12/13/2016  . Malignant neoplasm of upper-outer quadrant of right breast in female, estrogen receptor positive ([place]) 12/11/2016   Donato Heinz. Owens Shark PT  [place] Levo 09/12/2017, 5:35 PM  [place], [place], [address] Phone: [phone]   Fax:  [phone]  Name: Lorraine Dean MRN: [id number] Date of Birth: 1966/03/23

## 2017-09-16 ENCOUNTER — Encounter: Payer: Self-pay | Admitting: Physical Therapy

## 2017-09-16 ENCOUNTER — Ambulatory Visit: Payer: BLUE CROSS/BLUE SHIELD | Admitting: Physical Therapy

## 2017-09-16 DIAGNOSIS — M25611 Stiffness of right shoulder, not elsewhere classified: Secondary | ICD-10-CM

## 2017-09-16 DIAGNOSIS — R293 Abnormal posture: Secondary | ICD-10-CM

## 2017-09-16 DIAGNOSIS — M25511 Pain in right shoulder: Secondary | ICD-10-CM

## 2017-09-16 DIAGNOSIS — M6281 Muscle weakness (generalized): Secondary | ICD-10-CM

## 2017-09-16 NOTE — Patient Instructions (Signed)

## 2017-09-16 NOTE — Therapy (Signed)
[place], [place], [address] Phone: [phone]   Fax:  [phone]  Physical Therapy Treatment  Patient Details  Name: Lorraine Dean MRN: [id number] Date of Birth: 1966-03-26 Referring Provider: Thimmappa   Encounter Date: 09/16/2017  PT End of Session - 09/16/17 1527    Visit Number  5    Number of Visits  9    Date for PT Re-Evaluation  10/02/17    PT Start Time  7782    PT Stop Time  1434    PT Time Calculation (min)  39 min    Activity Tolerance  Patient tolerated treatment well    Behavior During Therapy  [facility] for tasks assessed/performed       Past Medical History:  Diagnosis Date  . Anxiety   . Cancer [facility])    right breast cancer  . Chronic rhinitis   . Depression   . Family history of adverse reaction to anesthesia   . Family history of breast cancer 12/13/2016  . Family history of lung cancer 12/13/2016  . High cholesterol   . HPV in female   . Malignant neoplasm of upper-outer quadrant of right breast in female, estrogen receptor positive ([place]) 12/11/2016  . PONV (postoperative nausea and vomiting)   . Vertigo    last flare 2016    Past Surgical History:  Procedure Laterality Date  . BREAST BIOPSY Right 2018   triple negative IDC grade 3 tumor  . BREAST LUMPECTOMY WITH RADIOACTIVE SEED AND SENTINEL LYMPH NODE BIOPSY Right 12/28/2016   Procedure: RIGHT BREAST LUMPECTOMY WITH RADIOACTIVE SEED AND SENTINEL LYMPH NODE MAPPING ERAS PATHWAY;  Surgeon: [name], MD;  Location: [place];  Service: General;  Laterality: Right;  PEC BLOCK  . BREAST RECONSTRUCTION WITH PLACEMENT OF TISSUE EXPANDER AND ALLODERM Right 08/08/2017   Procedure: RIGHT BREAST RECONSTRUCTION WITH PLACEMENT OF TISSUE EXPANDER AND ALLODERM;  Surgeon: Irene Limbo, MD;  Location: [place];  Service: Plastics;  Laterality: Right;  . NIPPLE SPARING MASTECTOMY Right 08/08/2017   Procedure: RIGHT NIPPLE  SPARING MASTECTOMY ERAS PATHWAY;  Surgeon: [name], MD;  Location: [place];  Service: General;  Laterality: Right;  . PORT-A-CATH REMOVAL N/A 08/08/2017   Procedure: REMOVAL PORT-A-CATH;  Surgeon: [name], MD;  Location: [place];  Service: General;  Laterality: N/A;  . PORTACATH PLACEMENT Left 01/18/2017   Procedure: INSERTION PORT-A-CATH;  Surgeon: [name], MD;  Location: [place];  Service: General;  Laterality: Left;  . WISDOM TOOTH EXTRACTION      There were no vitals filed for this visit.  Subjective Assessment - 09/16/17 1355    Subjective  I think my shoulder is feeling better. There is still some tightness.     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  Yes    Pain Score  4     Pain Location  -- trunk    Pain Orientation  Right    Pain Descriptors / Indicators  Sore         [facility] PT Assessment -  09/16/17 0001      AROM   Right Shoulder Flexion  148 Degrees    Right Shoulder ABduction  102 Degrees                   [facility] Adult PT Treatment/Exercise - 09/16/17 0001      Shoulder Exercises: Supine   Horizontal ABduction  Strengthening;Both;10 reps;Theraband pt returned threapist demo    Theraband Level (Shoulder Horizontal ABduction)  Level 1 (Yellow)    External Rotation  Strengthening;Both;10 reps;Theraband pt returned therapist demo    Theraband Level (Shoulder External Rotation)  Level 1 (Yellow)    Flexion  Strengthening;Both;10 reps;Theraband narrow and wide grip, pt returned therapist demo    Theraband Level (Shoulder Flexion)  Level 1 (Yellow)    ABduction  --    Other Supine  Exercises  D2 x 10 reps bilaterally with yellow band - pt returned therapist demo      Shoulder Exercises: Therapy Ball   Flexion  Both;10 reps yellow ball, deep stretch at the top     ABduction  Right;10 reps with pt returning therapist demo      Manual Therapy   Passive ROM  to right shoulder into flexion, abduction, horizontal abduction and external rotation to tolerance                  PT Long Term Goals - 09/16/17 1424      PT LONG TERM GOAL #1   Title  Pt will be able to independently verbalize lymphedema risk reduction practices    Time  4    Period  Weeks    Status  Achieved      PT LONG TERM GOAL #2   Title  Pt will demonstrate 165 degrees of right shoulder flexion to allow her to reach items on shelf    Baseline  108, 09/16/17- 148    Time  4    Period  Weeks    Status  On-going      PT LONG TERM GOAL #3   Title  Pt will demonstrate 165 degrees of right shoulder abduction to allow her to reach items out to sides    Baseline  60, 09/16/17- 102    Time  4    Period  Weeks    Status  On-going      PT LONG TERM GOAL #4   Title  Pt will be independent in a home exercise program for continued strengthening and stretching    Time  4    Status  On-going      PT LONG TERM GOAL #5   Title  Pt will report a 50% improvement in swelling in right lateral trunk to allow improved comfort    Baseline  09/16/17- 75%    Time  4    Period  Weeks    Status  Achieved            Plan - 09/16/17 1528    Clinical Impression Statement  Assessed pt's progress towards goals in therapy. She has met two of her goals and is progressing towards her ROM goals. Continued with PROM to right shoulder to pt's tolerance and pt demonstrated increased PROM by end of session. Instructed pt in supine scapular series and issued this as part of a home exercise program.     Rehab Potential  Good    Clinical Impairments Affecting Rehab Potential  previous hx of right breast cancer    PT  Frequency  2x / week    PT Duration  4 weeks    PT Treatment/Interventions  ADLs/Self Care Home Management;Patient/family education;Manual lymph drainage;Manual techniques;Orthotic Fit/Training;Therapeutic activities;Therapeutic exercise;Passive range of motion;Scar mobilization;Taping;Vasopneumatic Device    PT Next Visit Plan  continue with ROM stretching and strengtheing to UE. assess indep with supine scap series next session  Encourage active exercise progression to 3 way arm raises with weights .     PT Home Exercise Plan  supine dowel exercises, wall stretches     Consulted and Agree with Plan of Care  Patient       Patient will benefit from skilled therapeutic intervention in order to improve the following deficits and impairments:  Increased fascial restricitons, Pain, Decreased scar mobility, Postural dysfunction, Decreased range of motion, Decreased strength, Impaired UE functional use, Increased edema  Visit Diagnosis: Stiffness of right shoulder, not elsewhere classified  Acute pain of right shoulder  Muscle weakness (generalized)  Abnormal posture     Problem List Patient Active Problem List   Diagnosis Date Noted  . Breast cancer of upper-outer quadrant of right female breast ([place]) 08/08/2017  . Port catheter in place 01/25/2017  . Preoperative clearance 12/24/2016  . Palpitations 12/24/2016  . Hyperlipidemia 12/24/2016  . Family history of early CAD 12/24/2016  . Genetic testing 12/20/2016  . Family history of breast cancer 12/13/2016  . Family history of lung cancer 12/13/2016  . Malignant neoplasm of upper-outer quadrant of right breast in female, estrogen receptor positive ([place]) 12/11/2016    Allyson Sabal [facility] 09/16/2017, 3:31 PM  [place], [place], [address] Phone: [phone]   Fax:  [phone]  Name: MARNESHA GAGEN MRN: [id number] Date of Birth: 03-21-1966  Manus Gunning, PT 09/16/17 3:31 PM

## 2017-09-19 ENCOUNTER — Encounter: Payer: Self-pay | Admitting: Physical Therapy

## 2017-09-19 ENCOUNTER — Ambulatory Visit: Payer: BLUE CROSS/BLUE SHIELD | Admitting: Physical Therapy

## 2017-09-19 DIAGNOSIS — M6281 Muscle weakness (generalized): Secondary | ICD-10-CM

## 2017-09-19 DIAGNOSIS — M25611 Stiffness of right shoulder, not elsewhere classified: Secondary | ICD-10-CM | POA: Diagnosis not present

## 2017-09-19 DIAGNOSIS — M25511 Pain in right shoulder: Secondary | ICD-10-CM

## 2017-09-19 DIAGNOSIS — R600 Localized edema: Secondary | ICD-10-CM

## 2017-09-19 DIAGNOSIS — R293 Abnormal posture: Secondary | ICD-10-CM

## 2017-09-19 NOTE — Therapy (Signed)
[place], [place], [address] Phone: [phone]   Fax:  [phone]  Physical Therapy Treatment  Patient Details  Name: Lorraine Dean MRN: [id number] Date of Birth: November 29, 1965 Referring Provider: Thimmappa   Encounter Date: 09/19/2017  PT End of Session - 09/19/17 1751    Visit Number  6    Number of Visits  9    Date for PT Re-Evaluation  10/02/17    PT Start Time  1346    PT Stop Time  1430    PT Time Calculation (min)  44 min    Activity Tolerance  Patient tolerated treatment well    Behavior During Therapy  WFL for tasks assessed/performed       Past Medical History:  Diagnosis Date  . Anxiety   . Cancer [facility])    right breast cancer  . Chronic rhinitis   . Depression   . Family history of adverse reaction to anesthesia   . Family history of breast cancer 12/13/2016  . Family history of lung cancer 12/13/2016  . High cholesterol   . HPV in female   . Malignant neoplasm of upper-outer quadrant of right breast in female, estrogen receptor positive ([place]) 12/11/2016  . PONV (postoperative nausea and vomiting)   . Vertigo    last flare 2016    Past Surgical History:  Procedure Laterality Date  . BREAST BIOPSY Right 2018   triple negative IDC grade 3 tumor  . BREAST LUMPECTOMY WITH RADIOACTIVE SEED AND SENTINEL LYMPH NODE BIOPSY Right 12/28/2016   Procedure: RIGHT BREAST LUMPECTOMY WITH RADIOACTIVE SEED AND SENTINEL LYMPH NODE MAPPING ERAS PATHWAY;  Surgeon: [name], MD;  Location: [place];  Service: General;  Laterality: Right;  PEC BLOCK  . BREAST RECONSTRUCTION WITH PLACEMENT OF TISSUE EXPANDER AND ALLODERM Right 08/08/2017   Procedure: RIGHT BREAST RECONSTRUCTION WITH PLACEMENT OF TISSUE EXPANDER AND ALLODERM;  Surgeon: Irene Limbo, MD;  Location: [place];  Service: Plastics;  Laterality: Right;  . NIPPLE SPARING MASTECTOMY Right 08/08/2017   Procedure: RIGHT NIPPLE  SPARING MASTECTOMY ERAS PATHWAY;  Surgeon: [name], MD;  Location: [place];  Service: General;  Laterality: Right;  . PORT-A-CATH REMOVAL N/A 08/08/2017   Procedure: REMOVAL PORT-A-CATH;  Surgeon: [name], MD;  Location: [place];  Service: General;  Laterality: N/A;  . PORTACATH PLACEMENT Left 01/18/2017   Procedure: INSERTION PORT-A-CATH;  Surgeon: [name], MD;  Location: [place];  Service: General;  Laterality: Left;  . WISDOM TOOTH EXTRACTION      There were no vitals filed for this visit.  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  No/denies more of a skin sensitivity.          [facility] PT Assessment - 09/19/17 0001      AROM   Right Shoulder Flexion  155 Degrees    Right Shoulder ABduction  140 Degrees with external rotation                    [facility] Adult PT Treatment/Exercise - 09/19/17 0001      Lumbar Exercises: Stretches   Other Lumbar Stretch Exercise  lower trunk rotation       Shoulder Exercises: Supine   Protraction  AROM;Both;10 reps    Other Supine Exercises  supine  dowel rod x 10 reps     Other Supine Exercises  small controlled circles, controlled D2.      Shoulder Exercises: Sidelying   External Rotation  Strengthening;Right;10 reps    ABduction  AROM;Right;5 reps therapist assist with scapular upward rotation       Shoulder Exercises: Standing   Other Standing Exercises  3 way arm raises 5  reps with 2 # in flexion and scaption unable to do with Dean for abduction,       Shoulder Exercises: Pulleys   Flexion  2 minutes    ABduction  1 minute pt with discomfort at expander       Shoulder Exercises: Isometric Strengthening   Flexion  5X5"    Extension  5X5"    ABduction  5X5"             PT Education - 09/19/17 1750    Education provided  Yes    Education Details  3 way arm raises and shoulder isometrics     Person(s) Educated  Patient    Methods  Explanation;Handout    Comprehension  Verbalized understanding;Returned demonstration          PT Long Term Goals - 09/16/17 1424      PT LONG TERM GOAL #1   Title  Pt will be able to independently verbalize lymphedema risk reduction practices    Time  4    Period  Weeks    Status  Achieved      PT LONG TERM GOAL #2   Title  Pt will demonstrate 165 degrees of right shoulder flexion to allow her to reach items on shelf    Baseline  108, 09/16/17- 148    Time  4    Period  Weeks    Status  On-going      PT LONG TERM GOAL #3   Title  Pt will demonstrate 165 degrees of right shoulder abduction to allow her to reach items out to sides    Baseline  60, 09/16/17- 102    Time  4    Period  Weeks    Status  On-going      PT LONG TERM GOAL #4   Title  Pt will be independent in a home exercise program for continued strengthening and stretching    Time  4    Status  On-going      PT LONG TERM GOAL #5   Title  Pt will report a 50% improvement in swelling in right lateral trunk to allow improved comfort    Baseline  09/16/17- 75%    Time  4    Period  Weeks    Status  Achieved            Plan - 09/19/17 1751    Clinical Impression Statement  Pt continues to improve in ROM, but needs to improve in strength of shoulder muscles as she will need to be lifting gallong jugs when she returns to full duty at work  She benefitted from manual assist of scpaular mobility in sidelying with exercise today.       Rehab Potential  Good    Clinical Impairments Affecting Rehab Potential  previous hx of right breast cancer    PT Treatment/Interventions  ADLs/Self Care Home Management;Patient/family education;Manual lymph drainage;Manual techniques;Orthotic Fit/Training;Therapeutic activities;Therapeutic exercise;Passive range of motion;Scar mobilization;Taping;Vasopneumatic Device    PT Next Visit Plan  continue with ROM stretching and strengtheing to UE. assess indep with supine scap series  3 way arm raises with weights  and isometrics.  progress strength as she is able     Consulted and Agree with Plan of Care  Patient       Patient will benefit from skilled therapeutic intervention in order to improve the following deficits and impairments:  Increased fascial restricitons, Pain, Decreased scar mobility, Postural dysfunction, Decreased range of motion, Decreased strength, Impaired UE functional use, Increased edema  Visit Diagnosis: Stiffness of right shoulder, not elsewhere classified  Acute pain of right shoulder  Muscle weakness (generalized)  Abnormal posture  Localized edema     Problem List Patient Active Problem List   Diagnosis Date Noted  . Breast cancer of upper-outer quadrant of right female breast ([place]) 08/08/2017  . Port catheter in place 01/25/2017  . Preoperative clearance 12/24/2016  . Palpitations 12/24/2016  . Hyperlipidemia 12/24/2016  . Family history of early CAD 12/24/2016  . Genetic testing 12/20/2016  . Family history of breast cancer 12/13/2016  . Family history of lung cancer 12/13/2016  . Malignant neoplasm of upper-outer quadrant of right breast in female, estrogen receptor positive ([place]) 12/11/2016   Donato Heinz. Owens Shark PT  Norwood Levo 09/19/2017, 5:54 PM  [place], [place], [address] Phone: [phone]   Fax:  [phone]  Name: Lorraine Dean MRN: [id number] Date  of Birth: 29-Jul-1965

## 2017-09-19 NOTE — Patient Instructions (Addendum)
3 Way Raises:      Starting Position:  Leaning against wall, walk feet a few inches away from the wall and make tummy tight (tuck hips underneath you) Press back/shoulders/head against wall as much as possible. Keep thumbs up to ceiling, elbows straight and shoulders relaxed/down throughout.  1. Lift arms in front to shoulder height 2. Lift arms a little wider into a "V" to shoulder height 3. Lift arms out to sides in a "T" to shoulder height  Perform 10 times in each direction. Hold 1-2 lbs to start with and work up to 2-3 sets of 10/day. Perform 3-4 times/week. Increase weight as able, decreasing sets of 10 each time you increase weights, then slowly working your way back up to 2-3 sets each time.    Cancer Rehab [phone]   Flexion (Isometric)      Cancer Rehab [phone]    Press right fist against wall. Hold __5__ seconds. Repeat _5-10___ times. Do __1-2__ sessions per day.  SHOULDER: Abduction (Isometric)    Use wall as resistance. Press arm against pillow. Hold _5__ seconds. _5-10__ times. Do _1-2__ sessions per day.    External Rotation (Isometric)    Place back of left fist against door frame, with elbow bent. Press fist against door frame. Hold __5__ seconds. Repeat _5-10___ times. Do _1-2___ sessions per day.  Extension (Isometric)    Place left bent elbow and back of arm against wall. Press elbow against wall. Hold __5__ seconds. Repeat _5-10___ times. Do _1-2___ sessions per day.

## 2017-09-23 ENCOUNTER — Encounter: Payer: Self-pay | Admitting: Physical Therapy

## 2017-09-23 ENCOUNTER — Ambulatory Visit: Payer: BLUE CROSS/BLUE SHIELD | Admitting: Physical Therapy

## 2017-09-23 DIAGNOSIS — M25611 Stiffness of right shoulder, not elsewhere classified: Secondary | ICD-10-CM | POA: Diagnosis not present

## 2017-09-23 DIAGNOSIS — M25511 Pain in right shoulder: Secondary | ICD-10-CM

## 2017-09-23 DIAGNOSIS — R293 Abnormal posture: Secondary | ICD-10-CM

## 2017-09-23 NOTE — Therapy (Signed)
[place], [place], [address] Phone: [phone]   Fax:  [phone]  Physical Therapy Treatment  Patient Details  Name: Lorraine Dean MRN: [id number] Date of Birth: [birth year] Referring Provider: Thimmappa   Encounter Date: 09/23/2017  PT End of Session - 09/23/17 1429    Visit Number  7    Number of Visits  9    Date for PT Re-Evaluation  10/02/17    PT Start Time  1350    PT Stop Time  1429    PT Time Calculation (min)  39 min    Activity Tolerance  Patient tolerated treatment well    Behavior During Therapy  WFL for tasks assessed/performed       Past Medical History:  Diagnosis Date  . Anxiety   . Cancer [facility])    right breast cancer  . Chronic rhinitis   . Depression   . Family history of adverse reaction to anesthesia   . Family history of breast cancer 12/13/2016  . Family history of lung cancer 12/13/2016  . High cholesterol   . HPV in female   . Malignant neoplasm of upper-outer quadrant of right breast in female, estrogen receptor positive ([place]) 12/11/2016  . PONV (postoperative nausea and vomiting)   . Vertigo    last flare 2016    Past Surgical History:  Procedure Laterality Date  . BREAST BIOPSY Right 2018   triple negative IDC grade 3 tumor  . BREAST LUMPECTOMY WITH RADIOACTIVE SEED AND SENTINEL LYMPH NODE BIOPSY Right 12/28/2016   Procedure: RIGHT BREAST LUMPECTOMY WITH RADIOACTIVE SEED AND SENTINEL LYMPH NODE MAPPING ERAS PATHWAY;  Surgeon: [name], MD;  Location: [place];  Service: General;  Laterality: Right;  PEC BLOCK  . BREAST RECONSTRUCTION WITH PLACEMENT OF TISSUE EXPANDER AND ALLODERM Right 08/08/2017   Procedure: RIGHT BREAST RECONSTRUCTION WITH PLACEMENT OF TISSUE EXPANDER AND ALLODERM;  Surgeon: Irene Limbo, MD;  Location: [place];  Service: Plastics;  Laterality: Right;  . NIPPLE SPARING MASTECTOMY Right 08/08/2017   Procedure: RIGHT NIPPLE  SPARING MASTECTOMY ERAS PATHWAY;  Surgeon: [name], MD;  Location: [place];  Service: General;  Laterality: Right;  . PORT-A-CATH REMOVAL N/A 08/08/2017   Procedure: REMOVAL PORT-A-CATH;  Surgeon: [name], MD;  Location: [place];  Service: General;  Laterality: N/A;  . PORTACATH PLACEMENT Left 01/18/2017   Procedure: INSERTION PORT-A-CATH;  Surgeon: [name], MD;  Location: [place];  Service: General;  Laterality: Left;  . WISDOM TOOTH EXTRACTION      There were no vitals filed for this visit.  Subjective Assessment - 09/23/17 1353    Subjective  I still do not have full ROM. I wore my compression sleeve so you could see it.     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  No/denies    Pain Score  0-No pain         [facility] PT Assessment - 09/23/17 0001      AROM   Right Shoulder Flexion  160 Degrees  Right Shoulder ABduction  143 Degrees                   [facility] Adult PT Treatment/Exercise - 09/23/17 0001      Manual Therapy   Passive ROM  to right shoulder into flexion, abduction, horizontal abduction and external rotation to tolerance                  PT Long Term Goals - 09/16/17 1424      PT LONG TERM GOAL #1   Title  Pt will be able to independently verbalize lymphedema risk reduction practices    Time  4    Period  Weeks    Status  Achieved      PT LONG TERM GOAL #2   Title  Pt will demonstrate 165 degrees of right shoulder flexion to allow her to reach items on shelf    Baseline  108, 09/16/17- 148    Time  4    Period  Weeks    Status  On-going      PT LONG TERM  GOAL #3   Title  Pt will demonstrate 165 degrees of right shoulder abduction to allow her to reach items out to sides    Baseline  60, 09/16/17- 102    Time  4    Period  Weeks    Status  On-going      PT LONG TERM GOAL #4   Title  Pt will be independent in a home exercise program for continued strengthening and stretching    Time  4    Status  On-going      PT LONG TERM GOAL #5   Title  Pt will report a 50% improvement in swelling in right lateral trunk to allow improved comfort    Baseline  09/16/17- 75%    Time  4    Period  Weeks    Status  Achieved            Plan - 09/23/17 1430    Clinical Impression Statement  Pt is continuing to have tightness with right shoulder abduction ROM. Continued with PROM to this shoulder today and pt was able to achieve full R shoulder PROM by end of session. Pt has been doing her home exercise program and reports independence with recently added exercises. Encouraged pt to continue with these at home.     Rehab Potential  Good    Clinical Impairments Affecting Rehab Potential  previous hx of right breast cancer    PT Frequency  2x / week    PT Duration  4 weeks    PT Treatment/Interventions  ADLs/Self Care Home Management;Patient/family education;Manual lymph drainage;Manual techniques;Orthotic Fit/Training;Therapeutic activities;Therapeutic exercise;Passive range of motion;Scar mobilization;Taping;Vasopneumatic Device    PT Next Visit Plan  continue with ROM stretching and strengtheing to UE. assess indep with supine scap series  3 way arm raises with weights  and isometrics.  progress strength as she is able     PT Home Exercise Plan  supine dowel exercises, wall stretches     Consulted and Agree with Plan of Care  Patient       Patient will benefit from skilled therapeutic intervention in order to improve the following deficits and impairments:  Increased fascial restricitons, Pain, Decreased scar mobility, Postural dysfunction, Decreased  range of motion, Decreased strength, Impaired UE functional use, Increased edema  Visit Diagnosis: Stiffness of right shoulder, not elsewhere classified  Acute pain of right shoulder  Abnormal posture     Problem List Patient Active Problem List   Diagnosis Date Noted  . Breast cancer of upper-outer quadrant of right female breast ([place]) 08/08/2017  . Port catheter in place 01/25/2017  . Preoperative clearance 12/24/2016  . Palpitations 12/24/2016  . Hyperlipidemia 12/24/2016  . Family history of early CAD 12/24/2016  . Genetic testing 12/20/2016  . Family history of breast cancer 12/13/2016  . Family history of lung cancer 12/13/2016  . Malignant neoplasm of upper-outer quadrant of right breast in female, estrogen receptor positive ([place]) 12/11/2016    Allyson Sabal [facility] 09/23/2017, 2:32 PM  [place], [place], [address] Phone: [phone]   Fax:  [phone]  Name: Lorraine Dean MRN: [id number] Date of Birth: Jul 10, 1965  Manus Gunning, PT 09/23/17 2:32 PM

## 2017-09-27 ENCOUNTER — Ambulatory Visit: Payer: BLUE CROSS/BLUE SHIELD | Admitting: Physical Therapy

## 2017-09-27 ENCOUNTER — Encounter: Payer: Self-pay | Admitting: Physical Therapy

## 2017-09-27 DIAGNOSIS — M25511 Pain in right shoulder: Secondary | ICD-10-CM

## 2017-09-27 DIAGNOSIS — M25611 Stiffness of right shoulder, not elsewhere classified: Secondary | ICD-10-CM | POA: Diagnosis not present

## 2017-09-27 DIAGNOSIS — R293 Abnormal posture: Secondary | ICD-10-CM

## 2017-09-27 DIAGNOSIS — M6281 Muscle weakness (generalized): Secondary | ICD-10-CM

## 2017-09-27 NOTE — Therapy (Signed)
[place], [place], [address] Phone: [phone]   Fax:  [phone]  Physical Therapy Treatment  Patient Details  Name: Lorraine Dean MRN: [id number] Date of Birth: 05/31/1965 Referring Provider: Thimmappa   Encounter Date: 09/27/2017  PT End of Session - 09/27/17 1157    Visit Number  8    Number of Visits  9    Date for PT Re-Evaluation  10/02/17    PT Start Time  1107    PT Stop Time  1148    PT Time Calculation (min)  41 min    Activity Tolerance  Patient tolerated treatment well    Behavior During Therapy  [facility] for tasks assessed/performed       Past Medical History:  Diagnosis Date  . Anxiety   . Cancer [facility])    right breast cancer  . Chronic rhinitis   . Depression   . Family history of adverse reaction to anesthesia   . Family history of breast cancer 12/13/2016  . Family history of lung cancer 12/13/2016  . High cholesterol   . HPV in female   . Malignant neoplasm of upper-outer quadrant of right breast in female, estrogen receptor positive ([place]) 12/11/2016  . PONV (postoperative nausea and vomiting)   . Vertigo    last flare 2016    Past Surgical History:  Procedure Laterality Date  . BREAST BIOPSY Right 2018   triple negative IDC grade 3 tumor  . BREAST LUMPECTOMY WITH RADIOACTIVE SEED AND SENTINEL LYMPH NODE BIOPSY Right 12/28/2016   Procedure: RIGHT BREAST LUMPECTOMY WITH RADIOACTIVE SEED AND SENTINEL LYMPH NODE MAPPING ERAS PATHWAY;  Surgeon: [name], MD;  Location: [place];  Service: General;  Laterality: Right;  PEC BLOCK  . BREAST RECONSTRUCTION WITH PLACEMENT OF TISSUE EXPANDER AND ALLODERM Right 08/08/2017   Procedure: RIGHT BREAST RECONSTRUCTION WITH PLACEMENT OF TISSUE EXPANDER AND ALLODERM;  Surgeon: Irene Limbo, MD;  Location: [place];  Service: Plastics;  Laterality: Right;  . NIPPLE SPARING MASTECTOMY Right 08/08/2017   Procedure: RIGHT NIPPLE  SPARING MASTECTOMY ERAS PATHWAY;  Surgeon: [name], MD;  Location: [place];  Service: General;  Laterality: Right;  . PORT-A-CATH REMOVAL N/A 08/08/2017   Procedure: REMOVAL PORT-A-CATH;  Surgeon: [name], MD;  Location: [place];  Service: General;  Laterality: N/A;  . PORTACATH PLACEMENT Left 01/18/2017   Procedure: INSERTION PORT-A-CATH;  Surgeon: [name], MD;  Location: [place];  Service: General;  Laterality: Left;  . WISDOM TOOTH EXTRACTION      There were no vitals filed for this visit.  Subjective Assessment - 09/27/17 1110    Subjective  My shoulder was hurting this morning and it is still hurting a little. I think it is the way I slept.     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  Yes    Pain Score  2     Pain Location  Shoulder    Pain Orientation  Right    Pain Descriptors / Indicators  Aching    Pain  Type  Acute pain         [facility] PT Assessment - 09/27/17 0001      AROM   Right Shoulder Flexion  163 Degrees    Right Shoulder ABduction  160 Degrees                   [facility] Adult PT Treatment/Exercise - 09/27/17 0001      Shoulder Exercises: Supine   Horizontal ABduction  Strengthening;Both;10 reps;Theraband pt returned threapist demo    Theraband Level (Shoulder Horizontal ABduction)  Level 2 (Red)    External Rotation  Strengthening;Both;10 reps;Theraband pt returned therapist demo    Theraband Level (Shoulder External Rotation)  Level 2 (Red)    Flexion  Strengthening;Both;10 reps;Theraband narrow and wide grip, pt returned therapist demo    Theraband Level (Shoulder  Flexion)  Level 2 (Red)    Other Supine Exercises  D2 x 10 reps bilaterally with red band - pt returned therapist demo      Shoulder Exercises: Seated   Row  Strengthening;Both;10 reps;Theraband pt returned therapist demo    Theraband Level (Shoulder Row)  Level 2 (Red)      Shoulder Exercises: Sidelying   External Rotation  Strengthening;Right;10 reps;Weights x 2 sets, pt returned therapist demo    External Rotation Weight (lbs)  2    ABduction  Strengthening;Right;10 reps;Weights x 2 sets, started with 2lb but that was too much    ABduction Weight (lbs)  1      Shoulder Exercises: Standing   Other Standing Exercises  doorway stretch x 30 sec holds x 3 to relieve pec tightness- pt returned therapist demo      Shoulder Exercises: Pulleys   Flexion  2 minutes    ABduction  2 minutes      Shoulder Exercises: Therapy Ball   Flexion  Both;10 reps yellow ball, deep stretch at the top       Manual Therapy   Passive ROM  to right shoulder into flexion, abduction, horizontal abduction and external rotation to tolerance                  PT Long Term Goals - 09/16/17 1424      PT LONG TERM GOAL #1   Title  Pt will be able to independently verbalize lymphedema risk reduction practices    Time  4    Period  Weeks    Status  Achieved      PT LONG TERM GOAL #2   Title  Pt will demonstrate 165 degrees of right shoulder flexion to allow her to reach items on shelf    Baseline  108, 09/16/17- 148    Time  4    Period  Weeks    Status  On-going      PT LONG TERM GOAL #3   Title  Pt will demonstrate 165 degrees of right shoulder abduction to allow her to reach items out to sides    Baseline  60, 09/16/17- 102    Time  4    Period  Weeks    Status  On-going      PT LONG TERM GOAL #4   Title  Pt will be independent in a home exercise program for continued strengthening and stretching    Time  4    Status  On-going      PT LONG TERM GOAL #5   Title  Pt will report a 50%  improvement in swelling in right  lateral trunk to allow improved comfort    Baseline  09/16/17- 75%    Time  4    Period  Weeks    Status  Achieved            Plan - 09/27/17 1158    Clinical Impression Statement  Pt was having tightness in right pec today as well as some pain in right shoulder which she attributes to how she slept. Issued pt pec stretch for right shoulder and also instructed pt in new strengthening exercises. Upgraded supine scap from yellow band to red band.     Rehab Potential  Good    Clinical Impairments Affecting Rehab Potential  previous hx of right breast cancer    PT Frequency  2x / week    PT Duration  4 weeks    PT Treatment/Interventions  ADLs/Self Care Home Management;Patient/family education;Manual lymph drainage;Manual techniques;Orthotic Fit/Training;Therapeutic activities;Therapeutic exercise;Passive range of motion;Scar mobilization;Taping;Vasopneumatic Device    PT Next Visit Plan  continue with ROM stretching and strengtheing to UE. assess indep with supine scap series  3 way arm raises with weights  and isometrics.  progress strength as she is able     PT Home Exercise Plan  supine dowel exercises, wall stretches, doorway stretch, s/l ER and abduction with weights    Consulted and Agree with Plan of Care  Patient       Patient will benefit from skilled therapeutic intervention in order to improve the following deficits and impairments:  Increased fascial restricitons, Pain, Decreased scar mobility, Postural dysfunction, Decreased range of motion, Decreased strength, Impaired UE functional use, Increased edema  Visit Diagnosis: Stiffness of right shoulder, not elsewhere classified  Acute pain of right shoulder  Abnormal posture  Muscle weakness (generalized)     Problem List Patient Active Problem List   Diagnosis Date Noted  . Breast cancer of upper-outer quadrant of right female breast ([place]) 08/08/2017  . Port catheter in place  01/25/2017  . Preoperative clearance 12/24/2016  . Palpitations 12/24/2016  . Hyperlipidemia 12/24/2016  . Family history of early CAD 12/24/2016  . Genetic testing 12/20/2016  . Family history of breast cancer 12/13/2016  . Family history of lung cancer 12/13/2016  . Malignant neoplasm of upper-outer quadrant of right breast in female, estrogen receptor positive ([place]) 12/11/2016    Allyson Sabal [facility] 09/27/2017, 12:00 PM  [place], [place], [address] Phone: [phone]   Fax:  [phone]  Name: Lorraine Dean MRN: [id number] Date of Birth: 05/09/1965  Manus Gunning, PT 09/27/17 12:00 PM

## 2017-10-02 ENCOUNTER — Encounter: Payer: Self-pay | Admitting: Physical Therapy

## 2017-10-02 ENCOUNTER — Ambulatory Visit: Payer: BLUE CROSS/BLUE SHIELD | Admitting: Physical Therapy

## 2017-10-02 DIAGNOSIS — M25611 Stiffness of right shoulder, not elsewhere classified: Secondary | ICD-10-CM | POA: Diagnosis not present

## 2017-10-02 DIAGNOSIS — M25511 Pain in right shoulder: Secondary | ICD-10-CM

## 2017-10-02 DIAGNOSIS — M6281 Muscle weakness (generalized): Secondary | ICD-10-CM

## 2017-10-02 DIAGNOSIS — R293 Abnormal posture: Secondary | ICD-10-CM

## 2017-10-02 NOTE — Therapy (Signed)
[place], [place], [address] Phone: [phone]   Fax:  [phone]  Physical Therapy Treatment  Patient Details  Name: Lorraine Dean MRN: [id number] Date of Birth: March 03, 1966 Referring Provider: Thimmappa   Encounter Date: 10/02/2017  PT End of Session - 10/02/17 1619    Visit Number  9    Number of Visits  13    Date for PT Re-Evaluation  10/30/17    PT Start Time  5176    PT Stop Time  1518    PT Time Calculation (min)  42 min    Activity Tolerance  Patient tolerated treatment well    Behavior During Therapy  [facility] for tasks assessed/performed       Past Medical History:  Diagnosis Date  . Anxiety   . Cancer [facility])    right breast cancer  . Chronic rhinitis   . Depression   . Family history of adverse reaction to anesthesia   . Family history of breast cancer 12/13/2016  . Family history of lung cancer 12/13/2016  . High cholesterol   . HPV in female   . Malignant neoplasm of upper-outer quadrant of right breast in female, estrogen receptor positive ([place]) 12/11/2016  . PONV (postoperative nausea and vomiting)   . Vertigo    last flare 2016    Past Surgical History:  Procedure Laterality Date  . BREAST BIOPSY Right 2018   triple negative IDC grade 3 tumor  . BREAST LUMPECTOMY WITH RADIOACTIVE SEED AND SENTINEL LYMPH NODE BIOPSY Right 12/28/2016   Procedure: RIGHT BREAST LUMPECTOMY WITH RADIOACTIVE SEED AND SENTINEL LYMPH NODE MAPPING ERAS PATHWAY;  Surgeon: [name], MD;  Location: [place];  Service: General;  Laterality: Right;  PEC BLOCK  . BREAST RECONSTRUCTION WITH PLACEMENT OF TISSUE EXPANDER AND ALLODERM Right 08/08/2017   Procedure: RIGHT BREAST RECONSTRUCTION WITH PLACEMENT OF TISSUE EXPANDER AND ALLODERM;  Surgeon: Irene Limbo, MD;  Location: [place];  Service: Plastics;  Laterality: Right;  . NIPPLE SPARING MASTECTOMY Right 08/08/2017   Procedure: RIGHT  NIPPLE SPARING MASTECTOMY ERAS PATHWAY;  Surgeon: [name], MD;  Location: [place];  Service: General;  Laterality: Right;  . PORT-A-CATH REMOVAL N/A 08/08/2017   Procedure: REMOVAL PORT-A-CATH;  Surgeon: [name], MD;  Location: [place];  Service: General;  Laterality: N/A;  . PORTACATH PLACEMENT Left 01/18/2017   Procedure: INSERTION PORT-A-CATH;  Surgeon: [name], MD;  Location: [place];  Service: General;  Laterality: Left;  . WISDOM TOOTH EXTRACTION      There were no vitals filed for this visit.  Subjective Assessment - 10/02/17 1447    Subjective  My shoulder is not as good as the left side. I have not been as limited. I did not do as much over the holiday weekend.     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  No/denies    Pain Score  0-No pain         [facility] PT Assessment - 10/02/17 0001      AROM  Right Shoulder Flexion  160 Degrees    Right Shoulder ABduction  162 Degrees                   [facility] Adult PT Treatment/Exercise - 10/02/17 0001      Exercises   Exercises  Other Exercises    Other Exercises   Instructed pt in Strength ABC exercises today: pt held all stretches for 15 sec and performed all exercises x 10 reps each with 2 lb weights, pt required min verbal cues to perform exercises correctly- issued this as pt's HEP       Manual Therapy   Passive ROM  to right shoulder with gentle pulling into flexion, abduction, external rotation to tolerance                  PT Long Term Goals - 10/02/17 1451      PT LONG TERM GOAL #1   Title  Pt will be able to  independently verbalize lymphedema risk reduction practices    Time  4    Period  Weeks    Status  Achieved      PT LONG TERM GOAL #2   Title  Pt will demonstrate 165 degrees of right shoulder flexion to allow her to reach items on shelf    Baseline  108, 09/16/17- 148, 10/02/17- 160    Time  4    Period  Weeks    Status  On-going      PT LONG TERM GOAL #3   Title  Pt will demonstrate 165 degrees of right shoulder abduction to allow her to reach items out to sides    Baseline  60, 09/16/17- 102, 10/02/17- 162    Time  4    Period  Weeks    Status  On-going      PT LONG TERM GOAL #4   Title  Pt will be independent in a home exercise program for continued strengthening and stretching    Time  4    Period  Weeks    Status  On-going      PT LONG TERM GOAL #5   Title  Pt will report a 50% improvement in swelling in right lateral trunk to allow improved comfort    Baseline  09/16/17- 75%    Time  4    Period  Weeks    Status  Achieved            Plan - 10/02/17 1620    Clinical Impression Statement  Reassessed pt's right shoulder ROM today. She has not improved since last week and is still lacking about 10 degrees from left shoulder ROM. Pt is also still having some discomfort in the right shoulder. Instructed pt in Strength ABC program today and added that to her home exercise program. Pt would benefit from additional skilled PT services to meet her goals for therapy, decrease shoulder discomfort and progress pt towards independence with her home exercise program.     Rehab Potential  Good    Clinical Impairments Affecting Rehab Potential  previous hx of right breast cancer    PT Frequency  2x / week    PT Duration  4 weeks    PT Treatment/Interventions  ADLs/Self Care Home Management;Patient/family education;Manual lymph drainage;Manual techniques;Orthotic Fit/Training;Therapeutic activities;Therapeutic exercise;Passive range of motion;Scar mobilization;Taping;Vasopneumatic Device     PT Next Visit Plan  assess indep with Strength ABC, assess shoulder ROM and discomfort- continue PROM and stretching to R shoulder  PT Home Exercise Plan  supine dowel exercises, wall stretches, doorway stretch, s/l ER and abduction with weights, strength ABC    Consulted and Agree with Plan of Care  Patient       Patient will benefit from skilled therapeutic intervention in order to improve the following deficits and impairments:  Increased fascial restricitons, Pain, Decreased scar mobility, Postural dysfunction, Decreased range of motion, Decreased strength, Impaired UE functional use, Increased edema  Visit Diagnosis: Stiffness of right shoulder, not elsewhere classified  Acute pain of right shoulder  Abnormal posture  Muscle weakness (generalized)     Problem List Patient Active Problem List   Diagnosis Date Noted  . Breast cancer of upper-outer quadrant of right female breast ([place]) 08/08/2017  . Port catheter in place 01/25/2017  . Preoperative clearance 12/24/2016  . Palpitations 12/24/2016  . Hyperlipidemia 12/24/2016  . Family history of early CAD 12/24/2016  . Genetic testing 12/20/2016  . Family history of breast cancer 12/13/2016  . Family history of lung cancer 12/13/2016  . Malignant neoplasm of upper-outer quadrant of right breast in female, estrogen receptor positive ([place]) 12/11/2016    Allyson Sabal [facility] 10/02/2017, 4:24 PM  [place], [place], [address] Phone: [phone]   Fax:  [phone]  Name: Lorraine Dean MRN: [id number] Date of Birth: [birth year]  Manus Gunning, PT 10/02/17 4:25 PM

## 2017-10-04 ENCOUNTER — Encounter: Payer: BLUE CROSS/BLUE SHIELD | Admitting: Physical Therapy

## 2017-10-07 ENCOUNTER — Encounter: Payer: Self-pay | Admitting: Physical Therapy

## 2017-10-07 ENCOUNTER — Ambulatory Visit: Payer: BLUE CROSS/BLUE SHIELD | Attending: Plastic Surgery | Admitting: Physical Therapy

## 2017-10-07 DIAGNOSIS — M25511 Pain in right shoulder: Secondary | ICD-10-CM | POA: Insufficient documentation

## 2017-10-07 DIAGNOSIS — R600 Localized edema: Secondary | ICD-10-CM | POA: Insufficient documentation

## 2017-10-07 DIAGNOSIS — M25611 Stiffness of right shoulder, not elsewhere classified: Secondary | ICD-10-CM | POA: Diagnosis present

## 2017-10-07 DIAGNOSIS — R293 Abnormal posture: Secondary | ICD-10-CM | POA: Insufficient documentation

## 2017-10-07 DIAGNOSIS — M6281 Muscle weakness (generalized): Secondary | ICD-10-CM | POA: Insufficient documentation

## 2017-10-07 NOTE — Therapy (Signed)
[place], [place], [address] Phone: [phone]   Fax:  [phone]  Physical Therapy Treatment  Patient Details  Name: Lorraine Dean MRN: [id number] Date of Birth: 07-Sep-1965 Referring Provider: Thimmappa   Encounter Date: 10/07/2017  PT End of Session - 10/07/17 1527    Visit Number  10    Number of Visits  13    Date for PT Re-Evaluation  10/30/17    PT Start Time  4580    PT Stop Time  1515    PT Time Calculation (min)  42 min    Activity Tolerance  Patient tolerated treatment well    Behavior During Therapy  [facility] for tasks assessed/performed       Past Medical History:  Diagnosis Date  . Anxiety   . Cancer [facility])    right breast cancer  . Chronic rhinitis   . Depression   . Family history of adverse reaction to anesthesia   . Family history of breast cancer 12/13/2016  . Family history of lung cancer 12/13/2016  . High cholesterol   . HPV in female   . Malignant neoplasm of upper-outer quadrant of right breast in female, estrogen receptor positive ([place]) 12/11/2016  . PONV (postoperative nausea and vomiting)   . Vertigo    last flare 2016    Past Surgical History:  Procedure Laterality Date  . BREAST BIOPSY Right 2018   triple negative IDC grade 3 tumor  . BREAST LUMPECTOMY WITH RADIOACTIVE SEED AND SENTINEL LYMPH NODE BIOPSY Right 12/28/2016   Procedure: RIGHT BREAST LUMPECTOMY WITH RADIOACTIVE SEED AND SENTINEL LYMPH NODE MAPPING ERAS PATHWAY;  Surgeon: [name], MD;  Location: [place];  Service: General;  Laterality: Right;  PEC BLOCK  . BREAST RECONSTRUCTION WITH PLACEMENT OF TISSUE EXPANDER AND ALLODERM Right 08/08/2017   Procedure: RIGHT BREAST RECONSTRUCTION WITH PLACEMENT OF TISSUE EXPANDER AND ALLODERM;  Surgeon: Irene Limbo, MD;  Location: [place];  Service: Plastics;  Laterality: Right;  . NIPPLE SPARING MASTECTOMY Right 08/08/2017   Procedure: RIGHT  NIPPLE SPARING MASTECTOMY ERAS PATHWAY;  Surgeon: [name], MD;  Location: [place];  Service: General;  Laterality: Right;  . PORT-A-CATH REMOVAL N/A 08/08/2017   Procedure: REMOVAL PORT-A-CATH;  Surgeon: [name], MD;  Location: [place];  Service: General;  Laterality: N/A;  . PORTACATH PLACEMENT Left 01/18/2017   Procedure: INSERTION PORT-A-CATH;  Surgeon: [name], MD;  Location: [place];  Service: General;  Laterality: Left;  . WISDOM TOOTH EXTRACTION      There were no vitals filed for this visit.  Subjective Assessment - 10/07/17 1435    Subjective  The new exercises are going ok. I am trying to find my weights to do them with. I get tired quickly.     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  No/denies    Pain Score  0-No pain         [facility] PT Assessment - 10/07/17 0001      AROM   Right Shoulder Flexion  161  Degrees    Right Shoulder ABduction  169 Degrees                   [facility] Adult PT Treatment/Exercise - 10/07/17 0001      Shoulder Exercises: Supine   Horizontal ABduction  Strengthening;Both;10 reps;Theraband pt returned threapist demo    Theraband Level (Shoulder Horizontal ABduction)  Level 3 (Green)    External Rotation  Strengthening;Both;10 reps;Theraband pt returned therapist demo    Theraband Level (Shoulder External Rotation)  Level 3 (Green)    Flexion  Strengthening;Both;10 reps;Theraband narrow and wide grip, pt returned therapist demo    Theraband Level (Shoulder Flexion)  Level 3 (Green)    Other Supine Exercises  D2 x 10 reps bilaterally with green band - pt returned  therapist demo      Shoulder Exercises: Stretch   Other Shoulder Stretches  Instructed pt in the following stretches and issued as part of HEP: supine over foam roll with arms outstretched x 30 sec x 4, doorway stretch x1 x 30 sec holds, open book x 2 reps with 30 sec holds all to decrease pec tightness      Manual Therapy   Passive ROM  to right shoulder with gentle pulling into flexion, abduction, external rotation to tolerance                  PT Long Term Goals - 10/02/17 1451      PT LONG TERM GOAL #1   Title  Pt will be able to independently verbalize lymphedema risk reduction practices    Time  4    Period  Weeks    Status  Achieved      PT LONG TERM GOAL #2   Title  Pt will demonstrate 165 degrees of right shoulder flexion to allow her to reach items on shelf    Baseline  108, 09/16/17- 148, 10/02/17- 160    Time  4    Period  Weeks    Status  On-going      PT LONG TERM GOAL #3   Title  Pt will demonstrate 165 degrees of right shoulder abduction to allow her to reach items out to sides    Baseline  60, 09/16/17- 102, 10/02/17- 162    Time  4    Period  Weeks    Status  On-going      PT LONG TERM GOAL #4   Title  Pt will be independent in a home exercise program for continued strengthening and stretching    Time  4    Period  Weeks    Status  On-going      PT LONG TERM GOAL #5   Title  Pt will report a 50% improvement in swelling in right lateral trunk to allow improved comfort    Baseline  09/16/17- 75%    Time  4    Period  Weeks    Status  Achieved            Plan - 10/07/17 1527    Clinical Impression Statement  Pt continues to demonstrate improvement in right shoulder abduction ROM. Pt continues to have pain in right shoulder joint with end range of motion accompanied with some clicking which pt states is not painful. She feels independent in her home exercise program so did not review today. Instructed pt in numerous pec stretches today since pt  still feels limited by tightness.     Rehab Potential  Good  Clinical Impairments Affecting Rehab Potential  previous hx of right breast cancer    PT Frequency  2x / week    PT Duration  4 weeks    PT Treatment/Interventions  ADLs/Self Care Home Management;Patient/family education;Manual lymph drainage;Manual techniques;Orthotic Fit/Training;Therapeutic activities;Therapeutic exercise;Passive range of motion;Scar mobilization;Taping;Vasopneumatic Device    PT Next Visit Plan  assess shoulder ROM and discomfort- continue PROM and stretching to R shoulder    PT Home Exercise Plan  supine dowel exercises, wall stretches, doorway stretch, s/l ER and abduction with weights, strength ABC    Consulted and Agree with Plan of Care  Patient       Patient will benefit from skilled therapeutic intervention in order to improve the following deficits and impairments:  Increased fascial restricitons, Pain, Decreased scar mobility, Postural dysfunction, Decreased range of motion, Decreased strength, Impaired UE functional use, Increased edema  Visit Diagnosis: Stiffness of right shoulder, not elsewhere classified  Acute pain of right shoulder  Abnormal posture  Muscle weakness (generalized)     Problem List Patient Active Problem List   Diagnosis Date Noted  . Breast cancer of upper-outer quadrant of right female breast ([place]) 08/08/2017  . Port catheter in place 01/25/2017  . Preoperative clearance 12/24/2016  . Palpitations 12/24/2016  . Hyperlipidemia 12/24/2016  . Family history of early CAD 12/24/2016  . Genetic testing 12/20/2016  . Family history of breast cancer 12/13/2016  . Family history of lung cancer 12/13/2016  . Malignant neoplasm of upper-outer quadrant of right breast in female, estrogen receptor positive ([place]) 12/11/2016    Allyson Sabal [facility] 10/07/2017, 3:30 PM  [place], [place],  [address] Phone: [phone]   Fax:  [phone]  Name: Lorraine Dean MRN: [id number] Date of Birth: [birth year]  Manus Gunning, PT 10/07/17 3:30 PM

## 2017-10-15 ENCOUNTER — Ambulatory Visit: Payer: BLUE CROSS/BLUE SHIELD | Admitting: Physical Therapy

## 2017-10-18 ENCOUNTER — Ambulatory Visit: Payer: BLUE CROSS/BLUE SHIELD | Admitting: Physical Therapy

## 2017-10-18 ENCOUNTER — Encounter: Payer: Self-pay | Admitting: Physical Therapy

## 2017-10-18 DIAGNOSIS — R600 Localized edema: Secondary | ICD-10-CM

## 2017-10-18 DIAGNOSIS — M25611 Stiffness of right shoulder, not elsewhere classified: Secondary | ICD-10-CM

## 2017-10-18 DIAGNOSIS — R293 Abnormal posture: Secondary | ICD-10-CM

## 2017-10-18 DIAGNOSIS — M6281 Muscle weakness (generalized): Secondary | ICD-10-CM

## 2017-10-18 DIAGNOSIS — M25511 Pain in right shoulder: Secondary | ICD-10-CM

## 2017-10-18 NOTE — Therapy (Signed)
[place], [place], [address] Phone: [phone]   Fax:  [phone]  Physical Therapy Treatment  Patient Details  Name: CALISTA CRAIN MRN: [id number] Date of Birth: 1965/06/06 Referring Provider: Thimmappa   Encounter Date: 10/18/2017  PT End of Session - 10/18/17 0833    Visit Number  11    Number of Visits  13    Date for PT Re-Evaluation  10/30/17    PT Start Time  0800 pt only needed 30 minutes     PT Stop Time  0833    PT Time Calculation (min)  33 min    Activity Tolerance  Patient tolerated treatment well    Behavior During Therapy  [facility] for tasks assessed/performed       Past Medical History:  Diagnosis Date  . Anxiety   . Cancer [facility])    right breast cancer  . Chronic rhinitis   . Depression   . Family history of adverse reaction to anesthesia   . Family history of breast cancer 12/13/2016  . Family history of lung cancer 12/13/2016  . High cholesterol   . HPV in female   . Malignant neoplasm of upper-outer quadrant of right breast in female, estrogen receptor positive ([place]) 12/11/2016  . PONV (postoperative nausea and vomiting)   . Vertigo    last flare 2016    Past Surgical History:  Procedure Laterality Date  . BREAST BIOPSY Right 2018   triple negative IDC grade 3 tumor  . BREAST LUMPECTOMY WITH RADIOACTIVE SEED AND SENTINEL LYMPH NODE BIOPSY Right 12/28/2016   Procedure: RIGHT BREAST LUMPECTOMY WITH RADIOACTIVE SEED AND SENTINEL LYMPH NODE MAPPING ERAS PATHWAY;  Surgeon: [name], MD;  Location: [place];  Service: General;  Laterality: Right;  PEC BLOCK  . BREAST RECONSTRUCTION WITH PLACEMENT OF TISSUE EXPANDER AND ALLODERM Right 08/08/2017   Procedure: RIGHT BREAST RECONSTRUCTION WITH PLACEMENT OF TISSUE EXPANDER AND ALLODERM;  Surgeon: Irene Limbo, MD;  Location: [place];  Service: Plastics;  Laterality: Right;  . NIPPLE SPARING MASTECTOMY Right  08/08/2017   Procedure: RIGHT NIPPLE SPARING MASTECTOMY ERAS PATHWAY;  Surgeon: [name], MD;  Location: [place];  Service: General;  Laterality: Right;  . PORT-A-CATH REMOVAL N/A 08/08/2017   Procedure: REMOVAL PORT-A-CATH;  Surgeon: [name], MD;  Location: [place];  Service: General;  Laterality: N/A;  . PORTACATH PLACEMENT Left 01/18/2017   Procedure: INSERTION PORT-A-CATH;  Surgeon: [name], MD;  Location: [place];  Service: General;  Laterality: Left;  . WISDOM TOOTH EXTRACTION      There were no vitals filed for this visit.  Subjective Assessment - 10/18/17 0805    Subjective  "It's going ok"  Pt states she is not having pain except for the joint pain that she started noticing when she started the tamoxofin and the lipitor.  ( she thinks it might be the lipitor) . She is working with the doctor on that.  She walks at work whenever she can.  She is not interested in doing yoga.  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  No/denies         [facility] PT Assessment - 10/18/17 0001      AROM   Right Shoulder Flexion  167 Degrees    Right Shoulder ABduction  165 Degrees           Quick Dash - 10/18/17 0001    Open a tight or new jar  No difficulty    Do heavy household chores (wash walls, wash floors)  Mild difficulty    Carry a shopping bag or briefcase  No difficulty    Wash your back  No difficulty    Use a knife to cut  food  No difficulty    Recreational activities in which you take some force or impact through your arm, shoulder, or hand (golf, hammering, tennis)  Moderate difficulty    During the past week, to what extent has your arm, shoulder or hand problem interfered with your normal social activities with family, friends, neighbors, or groups?  Not at all    During the past week, to what extent has your arm, shoulder or hand problem limited your work or other regular daily activities  Slightly    Arm, shoulder, or hand pain.  Mild    Tingling (pins and needles) in your arm, shoulder, or hand  None    Difficulty Sleeping  Mild difficulty    DASH Score  13.64 %                     PT Education - 10/18/17 0835    Education provided  Yes    Education Details  [employer] program     Person(s) Educated  Patient    Methods  Explanation;Handout;Verbal cues    Comprehension  Verbalized understanding          PT Long Term Goals - 10/18/17 0815      PT LONG TERM GOAL #1   Title  Pt will be able to independently verbalize lymphedema risk reduction practices    Status  Achieved      PT LONG TERM GOAL #2   Title  Pt will demonstrate 165 degrees of right shoulder flexion to allow her to reach items on shelf    Baseline  108, 09/16/17- 148, 10/02/17- 160, 10/18/2017 167    Status  Achieved      PT LONG TERM GOAL #3   Title  Pt will demonstrate 165 degrees of right shoulder abduction to allow her to reach items out to sides    Baseline  60, 09/16/17- 102, 10/02/17- 162, 10/18/2017     Status  Achieved      PT LONG TERM GOAL #4   Title  Pt will be independent in a home exercise program for continued strengthening and stretching    Baseline  pt is doing a walking, stretching and strengthening program     Status  Achieved      PT LONG TERM GOAL #5   Title  Pt will report a 50% improvement in swelling in right lateral trunk to allow improved comfort    Status  Achieved             Plan - 10/18/17 0833    Clinical Impression  Statement  Pt has done very well and has met physical theapy goals.  She knows what to do to follow up at home.  She has a compression sleeve for long airplane travel  She was eduated [facility] after completion and healing from her next surgery  She is ready to discharge from PT but knows that she can call us with questions     PT Next Visit Plan  discharge     Consulted and Agree with Plan of Care  Patient       Patient will benefit from skilled therapeutic intervention in order to improve the following deficits and impairments:     Visit Diagnosis: Stiffness of right shoulder, not elsewhere classified  Acute pain of right shoulder  Abnormal posture  Muscle weakness (generalized)  Localized edema     Problem List Patient Active Problem List   Diagnosis Date Noted  . Breast cancer of upper-outer quadrant of right female breast ([place]) 08/08/2017  . Port catheter in place 01/25/2017  . Preoperative clearance 12/24/2016  . Palpitations 12/24/2016  . Hyperlipidemia 12/24/2016  . Family history of early CAD 12/24/2016  . Genetic testing 12/20/2016  . Family history of breast cancer 12/13/2016  . Family history of lung cancer 12/13/2016  . Malignant neoplasm of upper-outer quadrant of right breast in female, estrogen receptor positive ([place]) 12/11/2016   PHYSICAL THERAPY DISCHARGE SUMMARY  Visits from Start of Care: 11  Current functional level related to goals / functional outcomes: indpendent    Remaining deficits: Discomfort at expander in right chest    Education / Equipment: Lymphedema risk reduction, home exercise  Plan: Patient agrees to discharge.  Patient goals were met. Patient is being discharged due to being pleased with the current functional level.  ?????    Donato Heinz. Owens Shark PT  Norwood Levo 10/18/2017, 9:34 AM  [place], [place], [address] Phone: [phone]   Fax:  [phone]  Name: INDONESIA MCKEOUGH MRN: [id number] Date of Birth: [birth year]

## 2017-10-21 ENCOUNTER — Ambulatory Visit: Payer: BLUE CROSS/BLUE SHIELD | Admitting: Physical Therapy

## 2017-10-28 ENCOUNTER — Ambulatory Visit: Payer: BLUE CROSS/BLUE SHIELD | Admitting: Physical Therapy

## 2017-11-04 ENCOUNTER — Encounter: Payer: BLUE CROSS/BLUE SHIELD | Admitting: Physical Therapy

## 2017-11-06 ENCOUNTER — Telehealth: Payer: Self-pay

## 2017-11-06 NOTE — Telephone Encounter (Signed)
Spoke with pt reminding of SCP visit with NP on 11/13/17 at 10 am.  Pt said she will come to appt.

## 2017-11-13 ENCOUNTER — Other Ambulatory Visit: Payer: Self-pay | Admitting: Adult Health

## 2017-11-13 ENCOUNTER — Inpatient Hospital Stay: Payer: BLUE CROSS/BLUE SHIELD | Attending: Hematology and Oncology | Admitting: Adult Health

## 2017-11-13 ENCOUNTER — Encounter: Payer: Self-pay | Admitting: Adult Health

## 2017-11-13 ENCOUNTER — Telehealth: Payer: Self-pay | Admitting: Hematology and Oncology

## 2017-11-13 VITALS — BP 125/69 | HR 71 | Temp 98.5°F | Resp 18 | Ht 60.5 in | Wt 106.2 lb

## 2017-11-13 DIAGNOSIS — Z171 Estrogen receptor negative status [ER-]: Secondary | ICD-10-CM | POA: Insufficient documentation

## 2017-11-13 DIAGNOSIS — C50411 Malignant neoplasm of upper-outer quadrant of right female breast: Secondary | ICD-10-CM | POA: Insufficient documentation

## 2017-11-13 DIAGNOSIS — Z79810 Long term (current) use of selective estrogen receptor modulators (SERMs): Secondary | ICD-10-CM | POA: Diagnosis not present

## 2017-11-13 DIAGNOSIS — Z1239 Encounter for other screening for malignant neoplasm of breast: Secondary | ICD-10-CM

## 2017-11-13 DIAGNOSIS — Z170 Estrogen receptor positive status [ER+]: Secondary | ICD-10-CM

## 2017-11-13 NOTE — Progress Notes (Signed)
CLINIC:  Survivorship   REASON FOR VISIT:  Routine follow-up post-treatment for a recent history of breast cancer.  BRIEF ONCOLOGIC HISTORY:    Malignant neoplasm of upper-outer quadrant of right breast in female, estrogen receptor positive ([place])   12/05/2016 Initial Diagnosis    Screening detected calcifications Rt breast spanning 1.5 cm, U/S measured a lesion 1.2 cm size, Biopsy revealed IDC Grade 3, ER/PR Negative, Her 2 Neg Ratio: 1.05; Ki 67: 20%; T1bN0 Stage 1A Clinical stage      12/24/2016 Genetic Testing    The patient had genetic testing due to a personal and family history of breast cancer.  The [facility] Common Hereditary Cancer Panel + Melanoma panel was sent out. The Hereditary Gene Panel + Melanoma Panel offered by [facility] includes sequencing and/or deletion duplication testing of the following 53 genes: APC, ATM, AXIN2, BAP1, BARD1, BMPR1A, BRCA1, BRCA2, BRIP1, CDH1, CDK4, CDKN2A (p14ARF), CDKN2A (p16INK4a), CHEK2, CTNNA1, DICER1, EPCAM (Deletion/duplication testing only), GREM1 (promoter region deletion/duplication testing only), KIT, MC1R, MEN1, MLH1, MSH2, MSH3, MSH6, MUTYH, NBN, NF1, NHTL1, PALB2, PDGFRA, PMS2, POLD1, POLE, POT1, PTEN, RAD50, RAD51C, RAD51D, RB1, SDHB, SDHC, SDHD, SMAD4, SMARCA4. STK11, TERT, TP53, TSC1, TSC2, and VHL.  The following genes were evaluated for sequence changes only: MITF, SDHA and HOXB13 c.251G>A variant only.  Results: No pathogenic mutations identified. 3 VU'sS in BRCA1 c.2967T>A (p.Phe989Leu) , MSH6 c.2857G>A (p.Glu953Lys), and TERT c.902G>A (p.Arg301His) were identified.  The date of this test result is 12/24/2016.        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      09/2017 -  Anti-estrogen oral therapy    Tamoxifen daily       INTERVAL HISTORY:  Lorraine Dean presents to the [facility] today for our initial meeting to review her survivorship care plan detailing her treatment course for breast cancer, as well as monitoring long-term side effects of that treatment, education regarding health maintenance, screening, and overall wellness and health promotion.     Overall, Lorraine Dean reports feeling quite well.  She is taking Tamoxifen daily and has mild joint aches, but is also taking Lovastatin and isn't sure which one is contributing to her aches and pains.  She is doing well otherwise and is anticipating breast surgery in 12/2017 to remove her breast expander and place the implant.      REVIEW OF SYSTEMS:  Review of Systems  Constitutional: Negative for appetite change, chills and fatigue.  HENT:   Negative for hearing loss, lump/mass, nosebleeds, sore throat and trouble swallowing.   Eyes: Negative for eye problems and icterus.  Respiratory: Negative for chest tightness, cough and shortness of breath.   Cardiovascular: Negative for chest pain, leg swelling and palpitations.  Gastrointestinal: Negative for abdominal distention, abdominal pain, constipation, diarrhea, nausea and vomiting.  Endocrine: Negative for hot flashes.  Skin: Negative for itching.  Neurological: Negative for dizziness, extremity weakness, headaches and numbness.  Hematological: Negative for adenopathy. Does not bruise/bleed easily.  Psychiatric/Behavioral: Negative for depression. The patient is not nervous/anxious.   Breast: Denies any new nodularity, masses, tenderness, nipple changes, or nipple discharge.      ONCOLOGY TREATMENT TEAM:  1. Surgeon:  Dr. Marlou Starks at [facility] 2. Medical Oncologist: Dr. Lindi Adie  3. Radiation Oncologist: Dr. Sondra Come    PAST MEDICAL/SURGICAL HISTORY:  Past Medical History:  Diagnosis Date  . Anxiety   . Cancer [facility])    right breast cancer  . Chronic rhinitis   . Depression   . Family history of adverse reaction to anesthesia   . Family history of breast cancer 12/13/2016  . Family history of lung cancer 12/13/2016  . High cholesterol   . HPV in female   . Malignant neoplasm of upper-outer quadrant of right breast in female, estrogen receptor positive ([place]) 12/11/2016  . PONV (postoperative nausea and vomiting)   . Vertigo    last flare 2016   Past Surgical History:  Procedure Laterality Date  . BREAST BIOPSY Right 2018   triple negative IDC grade 3 tumor  . BREAST LUMPECTOMY WITH RADIOACTIVE SEED AND SENTINEL LYMPH NODE BIOPSY Right 12/28/2016   Procedure: RIGHT BREAST LUMPECTOMY WITH RADIOACTIVE SEED AND SENTINEL LYMPH NODE MAPPING ERAS PATHWAY;  Surgeon: [name], MD;  Location: [place];  Service: General;  Laterality: Right;  PEC BLOCK  . BREAST RECONSTRUCTION WITH PLACEMENT OF TISSUE EXPANDER AND ALLODERM Right 08/08/2017   Procedure: RIGHT BREAST RECONSTRUCTION WITH PLACEMENT OF TISSUE EXPANDER AND ALLODERM;  Surgeon: Irene Limbo, MD;  Location: [place];  Service: Plastics;  Laterality: Right;  . NIPPLE SPARING MASTECTOMY Right 08/08/2017   Procedure: RIGHT NIPPLE SPARING MASTECTOMY ERAS PATHWAY;  Surgeon: [name], MD;  Location: [place];  Service: General;  Laterality: Right;  . PORT-A-CATH REMOVAL N/A 08/08/2017   Procedure: REMOVAL PORT-A-CATH;  Surgeon: [name], MD;  Location: [place];  Service: General;  Laterality: N/A;  . PORTACATH PLACEMENT Left 01/18/2017   Procedure: INSERTION PORT-A-CATH;  Surgeon: [name], MD;  Location: [place];  Service: General;  Laterality: Left;  . WISDOM TOOTH EXTRACTION       ALLERGIES:  No Known  Allergies   CURRENT MEDICATIONS:  Outpatient Encounter Medications as of 11/13/2017  Medication Sig  . albuterol (PROVENTIL HFA;VENTOLIN HFA) 108 (90 Base) MCG/ACT inhaler Inhale 1-2 puffs into the lungs every 4 (four) hours as needed for wheezing or shortness of breath.  Marland Kitchen aspirin EC 81 MG tablet Take 81 mg by mouth 3 (three) times a week.   Marland Kitchen atorvastatin (LIPITOR) 10 MG tablet Take 10 mg by mouth every evening.  . Biotin w/ Vitamins C & E (HAIR/SKIN/NAILS PO) Take 1 tablet by mouth every evening.  . Calcium Carb-Cholecalciferol (CALCIUM 600+D3 PO) Take 1 tablet by mouth every evening.  . Cholecalciferol (VITAMIN D-3) 5000 units TABS Take 5,000 Units by mouth every evening.  Marland Kitchen GARCINIA CAMBOGIA-CHROMIUM PO Take 1 tablet by mouth 2 (two) times a week. Saturday & Sunday  . Krill Oil 350 MG CAPS Take 350 mg by mouth daily.  . mometasone (NASONEX) 50 MCG/ACT nasal spray 2 SPRAYS IN EACH NOSTRIL ONCE A DAY IF NEEDED NASALLY  . Multiple Vitamin (MULTIVITAMIN WITH MINERALS) TABS tablet Take 1 tablet by mouth every evening.  . Multiple Vitamins-Minerals (OCUVITE ADULT FORMULA) CAPS Take by mouth.  . Multiple Vitamins-Minerals (OCUVITE PO) Take 1 tablet by mouth every evening.  Marland Kitchen  naproxen sodium (ALEVE) 220 MG tablet Take 220-440 mg by mouth 2 (two) times daily as needed (for pain.).  Marland Kitchen Plant Sterols and Stanols (CHOLEST OFF PO) Take 900 mg by mouth every evening.  . potassium chloride (K-DUR) 10 MEQ tablet Take 10 mEq by mouth daily as needed (for leg cramping.).  Marland Kitchen Probiotic Product (ALIGN PO) Take 1 capsule by mouth every evening.  . tamoxifen (NOLVADEX) 20 MG tablet Take 1 tablet (20 mg total) by mouth daily.  . [DISCONTINUED] HYDROcodone-acetaminophen (NORCO/VICODIN) 5-325 MG tablet Take 1-2 tablets by mouth every 6 (six) hours as needed for moderate pain or severe pain. (Patient not taking: Reported on 11/13/2017)  . [DISCONTINUED] methocarbamol (ROBAXIN) 750 MG tablet Take 1 tablet (750 mg  total) by mouth 4 (four) times daily as needed (use for muscle cramps/pain). (Patient not taking: Reported on 11/13/2017)  . [DISCONTINUED] prochlorperazine (COMPAZINE) 10 MG tablet Take 1 tablet (10 mg total) by mouth every 6 (six) hours as needed (Nausea or vomiting). (Patient not taking: Reported on 07/24/2017)  . [DISCONTINUED] sulfamethoxazole-trimethoprim (BACTRIM) 400-80 MG tablet Take 1 tablet by mouth 2 (two) times daily. (Patient not taking: Reported on 11/13/2017)   No facility-administered encounter medications on file as of 11/13/2017.      ONCOLOGIC FAMILY HISTORY:  Family History  Problem Relation Age of Onset  . Heart disease Father 70       CABG  . Breast cancer Maternal Aunt 60  . Lung cancer Maternal Aunt        60's. unsure if it was a primary LG or a met from breast recurrence.  She had a brain met as well.  She died at 44.  . Lung cancer Paternal Aunt        dx. late 12's, died in 20's.  Marland Kitchen Heart disease Paternal Grandmother   . Heart disease Paternal Grandfather      GENETIC COUNSELING/TESTING: See above  SOCIAL HISTORY:  Social History   Socioeconomic History  . Marital status: Single    Spouse name: Not on file  . Number of children: Not on file  . Years of education: Not on file  . Highest education level: Not on file  Occupational History  . Not on file  Social Needs  . Financial resource strain: Not on file  . Food insecurity:    Worry: Not on file    Inability: Not on file  . Transportation needs:    Medical: Not on file    Non-medical: Not on file  Tobacco Use  . Smoking status: Never Smoker  . Smokeless tobacco: Never Used  Substance and Sexual Activity  . Alcohol use: Yes    Comment: social  . Drug use: No  . Sexual activity: Yes    Birth control/protection: None    Comment: Intercourse 1 month ago was on the pill,then stoppedl  Lifestyle  . Physical activity:    Days per week: Not on file    Minutes per session: Not on file  .  Stress: Not on file  Relationships  . Social connections:    Talks on phone: Not on file    Gets together: Not on file    Attends religious service: Not on file    Active member of club or organization: Not on file    Attends meetings of clubs or organizations: Not on file    Relationship status: Not on file  . Intimate partner violence:    Fear of current or ex partner:  Not on file    Emotionally abused: Not on file    Physically abused: Not on file    Forced sexual activity: Not on file  Other Topics Concern  . Not on file  Social History Narrative  . Not on file     PHYSICAL EXAMINATION:  Vital Signs:   Vitals:   11/13/17 0957  BP: 125/69  Pulse: 71  Resp: 18  Temp: 98.5 F (36.9 C)  SpO2: 100%   Filed Weights   11/13/17 0957  Weight: 106 lb 3.2 oz (48.2 kg)   General: Well-nourished, well-appearing female in no acute distress.  She is unaccompanied today.   HEENT: Head is normocephalic.  Pupils equal and reactive to light. Conjunctivae clear without exudate.  Sclerae anicteric. Oral mucosa is pink, moist.  Oropharynx is pink without lesions or erythema.  Lymph: No cervical, supraclavicular, or infraclavicular lymphadenopathy noted on palpation.  Cardiovascular: Regular rate and rhythm.Marland Kitchen Respiratory: Clear to auscultation bilaterally. Chest expansion symmetric; breathing non-labored.  Breast: right breast s/p mastectomy and expander placement, no swelling, nodules, or masses, left breast without nodules, masses, skin or nipple changes.   GI: Abdomen soft and round; non-tender, non-distended. Bowel sounds normoactive.  GU: Deferred.  Neuro: No focal deficits. Steady gait.  Psych: Mood and affect normal and appropriate for situation.  Extremities: No edema. MSK: No focal spinal tenderness to palpation.  Full range of motion in bilateral upper extremities Skin: Warm and dry.  LABORATORY DATA:  None for this visit.  DIAGNOSTIC IMAGING:  None for this visit.       ASSESSMENT AND PLAN:  Ms.. Dean is a pleasant 52 y.o. female with Stage IB right breast triple negative breast cancer diagnosed in 12/2016 followed by a right breast DCIS ER/PR positive diagnosed in 08/2017 (while she was receiving chemotherapy), treated with lumpectomy then mastectomy, adjuvant chemotherapy, and anti-estrogen therapy with Tamoxifen beginning in 09/2017.  She presents to the Survivorship Clinic for our initial meeting and routine follow-up post-completion of treatment for breast cancer.    1. Stage IB and 0 right breast cancer:  Lorraine Dean is continuing to recover from definitive treatment for breast cancer. She will follow-up with her medical oncologist, Dr. Lindi Adie in 6 months with history and physical exam per surveillance protocol.  She will continue her anti-estrogen therapy with Tamoxifen. Thus far, she is tolerating the Tamoxifen well, with minimal side effects. She was instructed to make Dr. Lindi Adie or myself aware if she begins to experience any worsening side effects of the medication and I could see her back in clinic to help manage those side effects, as needed. Today, a comprehensive survivorship care plan and treatment summary was reviewed with the patient today detailing her breast cancer diagnosis, treatment course, potential late/long-term effects of treatment, appropriate follow-up care with recommendations for the future, and patient education resources.  A copy of this summary, along with a letter will be sent to the patient's primary care provider via mail/fax/In Basket message after today's visit.    I am unclear when her last mammogram was on her left breast, and she is not the best historian.  She gets her mammograms from [facility] and there is limited scans in epic that I can see.  We will f/u with them.    2. Arthralgias: This could be from Lovastatin or Tamoxifen.  I counseled her to take a holiday from lovastatin for a couple of weeks, see if they improve.  If so,  she should follow up  with her PCP, if not, she should restart the Lovastatin and f/u with [place] about taking a holiday from Tamoxifen.  I counseled her that yoga and tai chi can help with her joint aches and pains.    3. Bone health:  Given Lorraine Dean's history of breast cancer, she is at slight risk for bone demineralization. I counseled her that Tamoxifen has a protective effect on her bones.  She was given education on specific activities to promote bone health.  4. Cancer screening:  Due to Lorraine Dean's history and her age, she should receive screening for skin cancers, colon cancer, and gynecologic cancers.  The information and recommendations are listed on the patient's comprehensive care plan/treatment summary and were reviewed in detail with the patient.    5. Health maintenance and wellness promotion: Lorraine Dean was encouraged to consume 5-7 servings of fruits and vegetables per day. We reviewed the "Nutrition Rainbow" handout, as well as the handout "Take Control of Your Health and Reduce Your Cancer Risk" from the [place].  She was also encouraged to engage in moderate to vigorous exercise for 30 minutes per day most days of the week. We discussed the [employer] fitness program, which is designed for cancer survivors to help them become more physically fit after cancer treatments.  She was instructed to limit her alcohol consumption and continue to abstain from tobacco use.     6. Support services/counseling: It is not uncommon for this period of the patient's cancer care trajectory to be one of many emotions and stressors.  We discussed an opportunity for her to participate in the next session of [facility] ("Finding Your New Normal") support group series designed for patients after they have completed treatment.   Lorraine Dean was encouraged to take advantage of our many other support services programs, support groups, and/or counseling in coping with her new life as a cancer  survivor after completing anti-cancer treatment.  She was given information regarding our available services and encouraged to contact me with any questions or for help enrolling in any of our support group/programs.    Dispo:   -Return to cancer center in 6 months with Dr. Lindi Adie  -Mammogram due in ? Will followup with [facility] -Follow up with Dr. Marlou Starks, likely in about a year -She is welcome to return back to the Survivorship Clinic at any time; no additional follow-up needed at this time.  -Consider referral back to survivorship as a long-term survivor for continued surveillance  A total of (30) minutes of face-to-face time was spent with this patient with greater than 50% of that time in counseling and care-coordination.   Gardenia Phlegm, [place] [phone]   Note: PRIMARY CARE PROVIDER [name], [place] [phone]

## 2017-11-13 NOTE — Telephone Encounter (Signed)
Gave patient avs and calendar of upcoming appts.  °

## 2017-11-27 ENCOUNTER — Telehealth: Payer: Self-pay

## 2017-11-27 NOTE — Telephone Encounter (Signed)
Returned patient's call regarding upcoming reconstructive surgery.  Patient has concerns regarding the fat cells that are being removed from abdomen to breast increasing her chance for re occurrence of breast cancer.  I informed patient any time there are cells involved there is always a chance of mutation of cells.  Patient voiced understanding.  Patient requested that Dr. Lindi Adie follow up with his recommendation once returning to office.

## 2017-12-02 ENCOUNTER — Telehealth: Payer: Self-pay

## 2017-12-02 NOTE — Telephone Encounter (Signed)
Outgoing call to patient per recent telephone note with Genia Hotter.  Pt was concerned about upcoming reconstructive surgery and use of fat cells from abd to go to breast and chance of re occurance of breast cancer.  Per Dr Lindi Adie, "No increase in breast cancer risk because breast cancer from ducts and glands not fat cells".  Pt voiced understanding. No other needs per pt at this time.

## 2017-12-16 ENCOUNTER — Other Ambulatory Visit: Payer: Self-pay

## 2017-12-16 ENCOUNTER — Encounter (HOSPITAL_BASED_OUTPATIENT_CLINIC_OR_DEPARTMENT_OTHER): Payer: Self-pay | Admitting: *Deleted

## 2017-12-16 NOTE — Progress Notes (Signed)
Ensure pre surgery drink given with instructions to complete by 0415 dos, surgical soap given with instructions, pt verbalized understanding. 

## 2017-12-20 ENCOUNTER — Encounter (HOSPITAL_BASED_OUTPATIENT_CLINIC_OR_DEPARTMENT_OTHER): Payer: Self-pay

## 2017-12-20 ENCOUNTER — Ambulatory Visit (HOSPITAL_BASED_OUTPATIENT_CLINIC_OR_DEPARTMENT_OTHER)
Admission: RE | Admit: 2017-12-20 | Discharge: 2017-12-20 | Disposition: A | Discharge status: Home / Self Care | Payer: BLUE CROSS/BLUE SHIELD | Source: Ambulatory Visit | Attending: Plastic Surgery | Admitting: Plastic Surgery

## 2017-12-20 ENCOUNTER — Ambulatory Visit (HOSPITAL_BASED_OUTPATIENT_CLINIC_OR_DEPARTMENT_OTHER): Payer: BLUE CROSS/BLUE SHIELD | Admitting: Anesthesiology

## 2017-12-20 ENCOUNTER — Encounter (HOSPITAL_BASED_OUTPATIENT_CLINIC_OR_DEPARTMENT_OTHER)
Admission: RE | Disposition: A | Discharge status: Home / Self Care | Payer: Self-pay | Source: Ambulatory Visit | Attending: Plastic Surgery

## 2017-12-20 ENCOUNTER — Other Ambulatory Visit: Payer: Self-pay

## 2017-12-20 DIAGNOSIS — Z9011 Acquired absence of right breast and nipple: Secondary | ICD-10-CM | POA: Insufficient documentation

## 2017-12-20 DIAGNOSIS — N651 Disproportion of reconstructed breast: Secondary | ICD-10-CM | POA: Insufficient documentation

## 2017-12-20 DIAGNOSIS — Z853 Personal history of malignant neoplasm of breast: Secondary | ICD-10-CM | POA: Diagnosis not present

## 2017-12-20 DIAGNOSIS — Z9221 Personal history of antineoplastic chemotherapy: Secondary | ICD-10-CM | POA: Insufficient documentation

## 2017-12-20 HISTORY — PX: LIPOSUCTION WITH LIPOFILLING: SHX6436

## 2017-12-20 HISTORY — PX: REMOVAL OF TISSUE EXPANDER AND PLACEMENT OF IMPLANT: SHX6457

## 2017-12-20 HISTORY — PX: BREAST ENHANCEMENT SURGERY: SHX7

## 2017-12-20 HISTORY — DX: Cardiac murmur, unspecified: R01.1

## 2017-12-20 SURGERY — REMOVAL, TISSUE EXPANDER, BREAST, WITH IMPLANT INSERTION
Anesthesia: General | Site: Breast | Laterality: Right

## 2017-12-20 MED ORDER — MIDAZOLAM HCL 2 MG/2ML IJ SOLN
1.0000 mg | INTRAMUSCULAR | Status: DC | PRN
  Administered 2017-12-20: 2 mg via INTRAVENOUS

## 2017-12-20 MED ORDER — ACETAMINOPHEN 500 MG PO TABS
ORAL_TABLET | ORAL | Status: AC
Start: 2017-12-20 — End: ?
  Filled 2017-12-20: qty 2

## 2017-12-20 MED ORDER — PROMETHAZINE HCL 25 MG/ML IJ SOLN
INTRAMUSCULAR | Status: DC | PRN
  Administered 2017-12-20: 6.25 mg via INTRAVENOUS

## 2017-12-20 MED ORDER — LACTATED RINGERS IV SOLN: INTRAVENOUS | Status: DC

## 2017-12-20 MED ORDER — CHLORHEXIDINE GLUCONATE CLOTH 2 % EX PADS: 6.0000 | MEDICATED_PAD | Freq: Once | CUTANEOUS | Status: DC

## 2017-12-20 MED ORDER — MIDAZOLAM HCL 2 MG/2ML IJ SOLN
INTRAMUSCULAR | Status: AC
  Filled 2017-12-20: qty 2

## 2017-12-20 MED ORDER — PHENYLEPHRINE HCL 10 MG/ML IJ SOLN
INTRAMUSCULAR | Status: DC | PRN
  Administered 2017-12-20 (×3): 120 ug via INTRAVENOUS
  Administered 2017-12-20: 40 ug via INTRAVENOUS

## 2017-12-20 MED ORDER — 0.9 % SODIUM CHLORIDE (POUR BTL) OPTIME
TOPICAL | Status: DC | PRN
  Administered 2017-12-20: 600 mL

## 2017-12-20 MED ORDER — EPHEDRINE SULFATE 50 MG/ML IJ SOLN
INTRAMUSCULAR | Status: DC | PRN
  Administered 2017-12-20 (×3): 10 mg via INTRAVENOUS

## 2017-12-20 MED ORDER — GABAPENTIN 300 MG PO CAPS
300.0000 mg | ORAL_CAPSULE | ORAL | Status: AC
  Administered 2017-12-20: 300 mg via ORAL

## 2017-12-20 MED ORDER — CEFAZOLIN SODIUM-DEXTROSE 2-4 GM/100ML-% IV SOLN
INTRAVENOUS | Status: AC
  Filled 2017-12-20: qty 100

## 2017-12-20 MED ORDER — SUGAMMADEX SODIUM 200 MG/2ML IV SOLN
INTRAVENOUS | Status: DC | PRN
  Administered 2017-12-20: 200 mg via INTRAVENOUS

## 2017-12-20 MED ORDER — BUPIVACAINE-EPINEPHRINE (PF) 0.25% -1:200000 IJ SOLN
INTRAMUSCULAR | Status: AC
  Filled 2017-12-20: qty 30

## 2017-12-20 MED ORDER — LIDOCAINE HCL (PF) 1 % IJ SOLN
INTRAMUSCULAR | Status: AC
  Filled 2017-12-20: qty 60

## 2017-12-20 MED ORDER — LIDOCAINE HCL (CARDIAC) PF 100 MG/5ML IV SOSY
PREFILLED_SYRINGE | INTRAVENOUS | Status: DC | PRN
  Administered 2017-12-20: 80 mg via INTRAVENOUS

## 2017-12-20 MED ORDER — EPINEPHRINE 30 MG/30ML IJ SOLN
INTRAMUSCULAR | Status: AC
  Filled 2017-12-20: qty 1

## 2017-12-20 MED ORDER — ACETAMINOPHEN 500 MG PO TABS
1000.0000 mg | ORAL_TABLET | ORAL | Status: AC
  Administered 2017-12-20: 1000 mg via ORAL

## 2017-12-20 MED ORDER — FENTANYL CITRATE (PF) 100 MCG/2ML IJ SOLN: 25.0000 ug | INTRAMUSCULAR | Status: DC | PRN

## 2017-12-20 MED ORDER — SODIUM BICARBONATE 4 % IV SOLN
INTRAVENOUS | Status: AC
Start: 2017-12-20 — End: ?
  Filled 2017-12-20: qty 10

## 2017-12-20 MED ORDER — FENTANYL CITRATE (PF) 100 MCG/2ML IJ SOLN
50.0000 ug | INTRAMUSCULAR | Status: DC | PRN
  Administered 2017-12-20 (×2): 50 ug via INTRAVENOUS

## 2017-12-20 MED ORDER — DEXAMETHASONE SODIUM PHOSPHATE 4 MG/ML IJ SOLN
INTRAMUSCULAR | Status: DC | PRN
Start: 2017-12-20 — End: 2017-12-20
  Administered 2017-12-20: 5 mg via INTRAVENOUS

## 2017-12-20 MED ORDER — CELECOXIB 200 MG PO CAPS
200.0000 mg | ORAL_CAPSULE | ORAL | Status: AC
  Administered 2017-12-20: 200 mg via ORAL

## 2017-12-20 MED ORDER — ONDANSETRON HCL 4 MG/2ML IJ SOLN
INTRAMUSCULAR | Status: AC
  Filled 2017-12-20: qty 2

## 2017-12-20 MED ORDER — GABAPENTIN 300 MG PO CAPS
ORAL_CAPSULE | ORAL | Status: AC
  Filled 2017-12-20: qty 1

## 2017-12-20 MED ORDER — BUPIVACAINE HCL (PF) 0.25 % IJ SOLN
INTRAMUSCULAR | Status: AC
  Filled 2017-12-20: qty 30

## 2017-12-20 MED ORDER — PROMETHAZINE HCL 25 MG/ML IJ SOLN
INTRAMUSCULAR | Status: AC
  Filled 2017-12-20: qty 1

## 2017-12-20 MED ORDER — BUPIVACAINE-EPINEPHRINE (PF) 0.25% -1:200000 IJ SOLN
INTRAMUSCULAR | Status: DC | PRN
  Administered 2017-12-20: 22 mL

## 2017-12-20 MED ORDER — HYDROCODONE-ACETAMINOPHEN 5-325 MG PO TABS: 1.0000 | ORAL_TABLET | ORAL | 0 refills | Status: DC | PRN

## 2017-12-20 MED ORDER — CELECOXIB 200 MG PO CAPS
ORAL_CAPSULE | ORAL | Status: AC
  Filled 2017-12-20: qty 1

## 2017-12-20 MED ORDER — METOCLOPRAMIDE HCL 5 MG/ML IJ SOLN: 10.0000 mg | Freq: Once | INTRAMUSCULAR | Status: DC | PRN

## 2017-12-20 MED ORDER — PROPOFOL 10 MG/ML IV BOLUS
INTRAVENOUS | Status: DC | PRN
  Administered 2017-12-20: 150 mg via INTRAVENOUS

## 2017-12-20 MED ORDER — ONDANSETRON HCL 4 MG/2ML IJ SOLN
INTRAMUSCULAR | Status: DC | PRN
  Administered 2017-12-20: 4 mg via INTRAVENOUS

## 2017-12-20 MED ORDER — DEXAMETHASONE SODIUM PHOSPHATE 10 MG/ML IJ SOLN
INTRAMUSCULAR | Status: AC
  Filled 2017-12-20: qty 1

## 2017-12-20 MED ORDER — SODIUM CHLORIDE 0.9 % IV SOLN
INTRAVENOUS | Status: DC | PRN
  Administered 2017-12-20: 1000 mL

## 2017-12-20 MED ORDER — PROPOFOL 10 MG/ML IV BOLUS
INTRAVENOUS | Status: AC
  Filled 2017-12-20: qty 20

## 2017-12-20 MED ORDER — ROCURONIUM BROMIDE 100 MG/10ML IV SOLN
INTRAVENOUS | Status: DC | PRN
  Administered 2017-12-20: 50 mg via INTRAVENOUS

## 2017-12-20 MED ORDER — SULFAMETHOXAZOLE-TRIMETHOPRIM 800-160 MG PO TABS: 1.0000 | ORAL_TABLET | Freq: Two times a day (BID) | ORAL | 0 refills | Status: DC

## 2017-12-20 MED ORDER — SCOPOLAMINE 1 MG/3DAYS TD PT72
1.0000 | MEDICATED_PATCH | Freq: Once | TRANSDERMAL | Status: AC | PRN
  Administered 2017-12-20: 1 via TRANSDERMAL

## 2017-12-20 MED ORDER — FENTANYL CITRATE (PF) 100 MCG/2ML IJ SOLN
INTRAMUSCULAR | Status: AC
  Filled 2017-12-20: qty 2

## 2017-12-20 MED ORDER — LIDOCAINE 2% (20 MG/ML) 5 ML SYRINGE
INTRAMUSCULAR | Status: AC
  Filled 2017-12-20: qty 5

## 2017-12-20 MED ORDER — SODIUM BICARBONATE 4 % IV SOLN
INTRAVENOUS | Status: DC | PRN
  Administered 2017-12-20: 300 mL via INTRAMUSCULAR

## 2017-12-20 MED ORDER — CEFAZOLIN SODIUM-DEXTROSE 2-4 GM/100ML-% IV SOLN
2.0000 g | INTRAVENOUS | Status: AC
  Administered 2017-12-20: 2 g via INTRAVENOUS

## 2017-12-20 MED ORDER — MEPERIDINE HCL 25 MG/ML IJ SOLN: 6.2500 mg | INTRAMUSCULAR | Status: DC | PRN

## 2017-12-20 MED ORDER — LACTATED RINGERS IV SOLN
INTRAVENOUS | Status: DC
  Administered 2017-12-20 (×2): via INTRAVENOUS

## 2017-12-20 MED ORDER — METHOCARBAMOL 500 MG PO TABS: 500.0000 mg | ORAL_TABLET | Freq: Three times a day (TID) | ORAL | 0 refills | Status: DC | PRN

## 2017-12-20 SURGICAL SUPPLY — 94 items
BAG DECANTER FOR FLEXI CONT (MISCELLANEOUS) ×3 IMPLANT
BANDAGE ACE 6X5 VEL STRL LF (GAUZE/BANDAGES/DRESSINGS) IMPLANT
BINDER ABDOMINAL 10 UNV 27-48 (MISCELLANEOUS) ×3 IMPLANT
BINDER ABDOMINAL 12 SM 30-45 (SOFTGOODS) IMPLANT
BINDER BREAST LRG (GAUZE/BANDAGES/DRESSINGS) ×3 IMPLANT
BINDER BREAST MEDIUM (GAUZE/BANDAGES/DRESSINGS) IMPLANT
BINDER BREAST XLRG (GAUZE/BANDAGES/DRESSINGS) IMPLANT
BINDER BREAST XXLRG (GAUZE/BANDAGES/DRESSINGS) IMPLANT
BLADE SURG 10 STRL SS (BLADE) ×6 IMPLANT
BLADE SURG 11 STRL SS (BLADE) ×3 IMPLANT
BLADE SURG 15 STRL LF DISP TIS (BLADE) ×4 IMPLANT
BLADE SURG 15 STRL SS (BLADE) ×2
BNDG GAUZE ELAST 4 BULKY (GAUZE/BANDAGES/DRESSINGS) ×6 IMPLANT
CANISTER LIPO FAT HARVEST (MISCELLANEOUS) ×3 IMPLANT
CANISTER SUCT 1200ML W/VALVE (MISCELLANEOUS) ×3 IMPLANT
CHLORAPREP W/TINT 26ML (MISCELLANEOUS) ×3 IMPLANT
COVER BACK TABLE 60X90IN (DRAPES) ×3 IMPLANT
COVER MAYO STAND STRL (DRAPES) ×6 IMPLANT
DECANTER SPIKE VIAL GLASS SM (MISCELLANEOUS) ×3 IMPLANT
DERMABOND ADVANCED (GAUZE/BANDAGES/DRESSINGS) ×4
DERMABOND ADVANCED .7 DNX12 (GAUZE/BANDAGES/DRESSINGS) ×8 IMPLANT
DRAIN CHANNEL 15F RND FF W/TCR (WOUND CARE) IMPLANT
DRAPE TOP ARMCOVERS (MISCELLANEOUS) ×3 IMPLANT
DRAPE U-SHAPE 76X120 STRL (DRAPES) ×3 IMPLANT
DRSG PAD ABDOMINAL 8X10 ST (GAUZE/BANDAGES/DRESSINGS) ×6 IMPLANT
DRSG TEGADERM 2-3/8X2-3/4 SM (GAUZE/BANDAGES/DRESSINGS) IMPLANT
ELECT BLADE 4.0 EZ CLEAN MEGAD (MISCELLANEOUS) ×3
ELECT BLADE 6.5 EXT (BLADE) ×3 IMPLANT
ELECT COATED BLADE 2.86 ST (ELECTRODE) ×3 IMPLANT
ELECT REM PT RETURN 9FT ADLT (ELECTROSURGICAL) ×3
ELECTRODE BLDE 4.0 EZ CLN MEGD (MISCELLANEOUS) ×2 IMPLANT
ELECTRODE REM PT RTRN 9FT ADLT (ELECTROSURGICAL) ×2 IMPLANT
EVACUATOR SILICONE 100CC (DRAIN) ×6 IMPLANT
GLOVE BIO SURGEON STRL SZ 6 (GLOVE) ×6 IMPLANT
GLOVE BIO SURGEON STRL SZ7 (GLOVE) ×3 IMPLANT
GLOVE BIOGEL PI IND STRL 7.0 (GLOVE) ×2 IMPLANT
GLOVE BIOGEL PI INDICATOR 7.0 (GLOVE) ×1
GOWN STRL REUS W/ TWL LRG LVL3 (GOWN DISPOSABLE) ×4 IMPLANT
GOWN STRL REUS W/ TWL XL LVL3 (GOWN DISPOSABLE) ×2 IMPLANT
GOWN STRL REUS W/TWL LRG LVL3 (GOWN DISPOSABLE) ×2
GOWN STRL REUS W/TWL XL LVL3 (GOWN DISPOSABLE) ×1
IMPL BREAST GEL 140CC (Breast) ×2 IMPLANT
IMPL BREAST SILICONE 335CC (Breast) ×2 IMPLANT
IMPLANT BREAST GEL 140CC (Breast) ×3 IMPLANT
IMPLANT BREAST SILICONE 335CC (Breast) ×3 IMPLANT
IV NS 1000ML (IV SOLUTION)
IV NS 1000ML BAXH (IV SOLUTION) IMPLANT
IV NS 500ML (IV SOLUTION) ×1
IV NS 500ML BAXH (IV SOLUTION) ×2 IMPLANT
KIT FILL SYSTEM UNIVERSAL (SET/KITS/TRAYS/PACK) IMPLANT
LINER CANISTER 1000CC FLEX (MISCELLANEOUS) ×3 IMPLANT
MARKER SKIN DUAL TIP RULER LAB (MISCELLANEOUS) IMPLANT
NDL SAFETY ECLIPSE 18X1.5 (NEEDLE) ×4 IMPLANT
NEEDLE HYPO 18GX1.5 SHARP (NEEDLE) ×2
NEEDLE HYPO 25X1 1.5 SAFETY (NEEDLE) ×3 IMPLANT
NS IRRIG 1000ML POUR BTL (IV SOLUTION) ×3 IMPLANT
PACK BASIN DAY SURGERY FS (CUSTOM PROCEDURE TRAY) ×3 IMPLANT
PAD ALCOHOL SWAB (MISCELLANEOUS) ×6 IMPLANT
PENCIL BUTTON HOLSTER BLD 10FT (ELECTRODE) ×3 IMPLANT
PIN SAFETY STERILE (MISCELLANEOUS) IMPLANT
SHEET MEDIUM DRAPE 40X70 STRL (DRAPES) ×3 IMPLANT
SIZER BREAST GEL REUSE 140CC (SIZER) ×3
SIZER BREAST REUSE 125CC (SIZER) ×1
SIZER BREAST REUSE 335CC (SIZER) ×1
SIZER BREAST REUSE 345CC (SIZER) ×3
SIZER BRST GEL REUSE 140CC (SIZER) ×2 IMPLANT
SIZER BRST REUSE 10.25 125CC (SIZER) ×2 IMPLANT
SIZER BRST REUSE 345CC (SIZER) ×2 IMPLANT
SIZER BRST REUSE P4.8X 335CC (SIZER) ×2 IMPLANT
SLEEVE SCD COMPRESS KNEE MED (MISCELLANEOUS) ×3 IMPLANT
SLEEVE SURGEON STRL (DRAPES) ×3 IMPLANT
SPONGE LAP 18X18 RF (DISPOSABLE) ×9 IMPLANT
STAPLER VISISTAT 35W (STAPLE) ×3 IMPLANT
STRIP CLOSURE SKIN 1/2X4 (GAUZE/BANDAGES/DRESSINGS) IMPLANT
SUT ETHILON 2 0 FS 18 (SUTURE) IMPLANT
SUT MNCRL AB 4-0 PS2 18 (SUTURE) ×6 IMPLANT
SUT PDS AB 2-0 CT2 27 (SUTURE) ×3 IMPLANT
SUT SILK 2 0 SH (SUTURE) IMPLANT
SUT VIC AB 3-0 PS1 18 (SUTURE)
SUT VIC AB 3-0 PS1 18XBRD (SUTURE) IMPLANT
SUT VIC AB 3-0 SH 27 (SUTURE) ×2
SUT VIC AB 3-0 SH 27X BRD (SUTURE) ×4 IMPLANT
SUT VICRYL 4-0 PS2 18IN ABS (SUTURE) ×6 IMPLANT
SYR 10ML LL (SYRINGE) ×12 IMPLANT
SYR 50ML LL SCALE MARK (SYRINGE) ×6 IMPLANT
SYR BULB IRRIGATION 50ML (SYRINGE) ×6 IMPLANT
SYR CONTROL 10ML LL (SYRINGE) ×3 IMPLANT
SYR TB 1ML LL NO SAFETY (SYRINGE) IMPLANT
TOWEL GREEN STERILE FF (TOWEL DISPOSABLE) ×6 IMPLANT
TUBE CONNECTING 20X1/4 (TUBING) ×6 IMPLANT
TUBING INFILTRATION IT-10001 (TUBING) ×3 IMPLANT
TUBING SET GRADUATE ASPIR 12FT (MISCELLANEOUS) ×3 IMPLANT
UNDERPAD 30X30 (UNDERPADS AND DIAPERS) ×6 IMPLANT
YANKAUER SUCT BULB TIP NO VENT (SUCTIONS) ×3 IMPLANT

## 2017-12-20 NOTE — H&P (Signed)
Subjective:     Patient ID: Lorraine Dean is a 52 y.o. female.  HPI  4.5 months post op right NSM with immediate expander ADM reconstruction.   Presented 2018 with screening detected calcifications spanning 1.5 cm. Biopsy with triple negative IDC. Underwent lumpectomy 12/2016 with pathology 1.2 cm IDC, broadly 0.1 cm to the posterior margin, 0/5 SLN.   Adjuvant chemotherapy completed this 2.8.19. Developed new mass adjacent to lumpectomy site during this time. Recent MMG with new calcification and biopsy with DCIS, ER/PR+. [place]el last visit had MRI which noted 2.6 cm of linear NME extending lateral and posterior to the biopsy site. Now post mastectomy with final pathology no residual disease.  Plan tamoxifen for 10 y duration.  Genetics negative.  Prior to lumpectomy B cup, desires larger. Noted some loss volume post lumpectomy. Right mastectomy 257 g. No weight recorded on lumpectomy.   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 Lives alone.     Objective:   Physical Exam  Cardiovascular: Normal rate, regular rhythm and normal heart sounds.   Pulmonary/Chest: Effort normal and breath sounds normal.  Abdominal: Soft.   Chest: soft scar maturing NAC healed SN to nipple R 19 L 20 cm CW R 12 BW L 15 Nipple to IMF R 7 L 8 cm Soft tissue pinch on left 6 cm     Assessment:     Right breast ca UOQ ER- s/p lumpectomy Adjuvant chemotherapy DCIS right breast S/p right NSM, prepectoral TE/ADM (Alloderm) reconstruction    Plan:      Patient reports desire to be bigger than current. Counseled size will be limited by her BW. Plan removal right TE and placement implant. Plan capacity filled smooth round silicone. Over left plan augmentation for symmetry. Reviewed IMF incision on left. Plan subfascial placement. Reviewed subglandular associated with higher risk CC and incidence of this. Discussed breast tissue will develop ptosis with aging on left may require additional surgery for  this.  We reviewed implant catalogue and counseled cannot be significantly larger than present on right based on BW.  Reviewed purpose lipofilling for thickening flaps, prevent visible rippling. Reviewed need for compression and asked to purchase Spanx type garment for use post op. Discussed variable take graft, may need to repeat, fat necrosis that presents as lumps.   [id number] 300 ml tissue expander placed, fill volume 280 ml saline.    Irene Limbo, MD [facility] [phone], pin [phone]

## 2017-12-20 NOTE — Anesthesia Preprocedure Evaluation (Addendum)
Anesthesia Evaluation  Patient identified by MRN, date of birth, ID band Patient awake    Reviewed: Allergy & Precautions, NPO status , Patient's Chart, lab work & pertinent test results  History of Anesthesia Complications (+) PONV  Airway Mallampati: II  TM Distance: <3 FB Neck ROM: Full    Dental no notable dental hx.    Pulmonary neg pulmonary ROS,    Pulmonary exam normal breath sounds clear to auscultation       Cardiovascular negative cardio ROS Normal cardiovascular exam Rhythm:Regular Rate:Normal     Neuro/Psych negative neurological ROS  negative psych ROS   GI/Hepatic negative GI ROS, Neg liver ROS,   Endo/Other  negative endocrine ROS  Renal/GU negative Renal ROS  negative genitourinary   Musculoskeletal negative musculoskeletal ROS (+)   Abdominal   Peds negative pediatric ROS (+)  Hematology negative hematology ROS (+)   Anesthesia Other Findings   Reproductive/Obstetrics negative OB ROS                            Anesthesia Physical Anesthesia Plan  ASA: II  Anesthesia Plan: General   Post-op Pain Management:    Induction: Intravenous  PONV Risk Score and Plan: 4 or greater and Ondansetron, Dexamethasone, Midazolam and Scopolamine patch - Pre-op  Airway Management Planned: LMA and Oral ETT  Additional Equipment:   Intra-op Plan:   Post-operative Plan: Extubation in OR  Informed Consent: I have reviewed the patients History and Physical, chart, labs and discussed the procedure including the risks, benefits and alternatives for the proposed anesthesia with the patient or authorized representative who has indicated his/her understanding and acceptance.   Dental advisory given  Plan Discussed with: CRNA  Anesthesia Plan Comments:         Anesthesia Quick Evaluation

## 2017-12-20 NOTE — Discharge Instructions (Signed)

## 2017-12-20 NOTE — Op Note (Signed)
Operative Note   DATE OF OPERATION: 8.16.19  LOCATION: [facility]-outpatient  SURGICAL DIVISION: Plastic Surgery  PREOPERATIVE DIAGNOSES:  1. History breast cancer 2. Acquired absence breast 3. Asymmetry native and reconstructed breast  POSTOPERATIVE DIAGNOSES:  same  PROCEDURE:  1. Removal right tissue expander and placement silicone implant 2. Lipofilling from abdomen to right chest 3. Left breast augmentation for symmetry  SURGEON: Irene Limbo MD MBA  ASSISTANT: none  ANESTHESIA:  General.   EBL: 50 ml  COMPLICATIONS: None immediate.   INDICATIONS FOR PROCEDURE:  The patient, Lorraine Dean, is a 52 y.o. female born on 06/17/1965, is here for second stage breast reconstruction following right nipple sparing mastectomy and immediate prepectoral expander ADM reconstruction.   FINDINGS: Completed incorporation ADM noted. 60 ml fat infiltrated over right chest. RIGHT Natrelle Inspira Smooth Round Full Projection 335 ml implant, REF SRF-335 SN [id number] LEFT Natrelle Inspira Smooth Round Low Projection 140 ml implant placed, SRL-140 SN [id number]  DESCRIPTION OF PROCEDURE:  The patient's operative site was marked with the patient in the preoperative area to mark sternal notch, chest midline, breast meridians, and anterior axlllary lines. Supra and infraumbilical abdomen marked for liposuction. The patient was taken to the operating room. SCDs were placed and IV antibiotics were given. The patient's operative site was prepped and draped in a sterile fashion. A time out was performed and all information was confirmed to be correct. Left nipple shield Tegaderm placed. Incision made in right lateral breast mastectomy scar and carried through superficial fascia to ADM. ADM incised. Expander removed. Full incorporation ADM noted. Sizer placed. I then directed attention to left breast. Inframammary fold incision made and carried to pectoralis muscle. Subfascial dissection completed to  accommodate implant. The inframammary fold was stabilized with interrupted 2-0 PDS from superficial fascia of caudal incision to chest wall. Sizer placed and patient brought to sitting position. High profile 335 ml selected for right chest and low profile 140 ml implant selected for left breast. The patient was returned to supine position.  Stab incision made over bilateralabdomenand tumescent fluid infiltrated over supra and infraumbilical RCVELFY,[id number] tumescent infiltrated. Power assisted liposuction performed to endpoint symmetric contour and soft tissue thickness, total lipoaspirate250 ml. The fat was then washed and prepared by gravity for infiltration. Harvested fat was then infiltrated in subcutaneous plane throughout total envelope mastectomy flap on right chest.  Each cavity irrigated with bacitracin, Ancef, gentamicinsolution.Hemostasis ensured.Each cavity then irrigated with Betadine. The implant was placed inrightchest andimplant orientation ensured. Closure completed with 3-0 vicryl to close superficial fascia and ADM over implant. 4-0 vicryl used to close dermis followed by 4-0 monocryl subcuticular. Implant placed in left breast.Closure completedin similar fashion.Abdomen incisions approximated with simple 4-0 monocryl stitch.Tissue adhesive applied to breast incisions. Dry dressing applied, followed by breastbinder and abdominal compression garment.  The patient was allowed to wake from anesthesia, extubated and taken to the recovery room in satisfactory condition.   SPECIMENS: none  DRAINS: none  Irene Limbo, MD [facility] Plastic & Reconstructive Surgery [phone], pin [phone]

## 2017-12-20 NOTE — Anesthesia Postprocedure Evaluation (Signed)
Anesthesia Post Note  Patient: Lorraine Dean  Procedure(s) Performed: REMOVAL OF TISSUE EXPANDER AND PLACEMENT OF IMPLANT (Right Breast) LEFT AUGMENTATION FOR SYMMETRY (Left Breast) LIPOFILLING FROM ABDOMEN TO RIGHT CHEST (Right Breast)     Patient location during evaluation: PACU Anesthesia Type: General Level of consciousness: awake and alert Pain management: pain level controlled Vital Signs Assessment: post-procedure vital signs reviewed and stable Respiratory status: spontaneous breathing, nonlabored ventilation, respiratory function stable and patient connected to nasal cannula oxygen Cardiovascular status: blood pressure returned to baseline and stable Postop Assessment: no apparent nausea or vomiting Anesthetic complications: no    Last Vitals:  Vitals:   12/20/17 1030 12/20/17 1102  BP: 109/65 124/61  Pulse: 68 76  Resp: 20 16  Temp:  36.7 C  SpO2: 100% 100%    Last Pain:  Vitals:   12/20/17 1102  TempSrc:   PainSc: 0-No pain                 Montez Hageman

## 2017-12-20 NOTE — Transfer of Care (Signed)
Immediate Anesthesia Transfer of Care Note  Patient: Lorraine Dean  Procedure(s) Performed: REMOVAL OF TISSUE EXPANDER AND PLACEMENT OF IMPLANT (Right Breast) LEFT AUGMENTATION FOR SYMMETRY (Left Breast) LIPOFILLING FROM ABDOMEN TO RIGHT CHEST (Right Breast)  Patient Location: PACU  Anesthesia Type:General  Level of Consciousness: awake, alert  and oriented  Airway & Oxygen Therapy: Patient Spontanous Breathing and Patient connected to face mask oxygen  Post-op Assessment: Report given to RN and Post -op Vital signs reviewed and stable  Post vital signs: Reviewed and stable  Last Vitals:  Vitals Value Taken Time  BP    Temp    Pulse 82 12/20/2017  9:40 AM  Resp 8 12/20/2017  9:40 AM  SpO2 100 % 12/20/2017  9:40 AM    Last Pain:  Vitals:   12/20/17 0636  TempSrc: Oral  PainSc: 0-No pain         Complications: No apparent anesthesia complications

## 2017-12-20 NOTE — Anesthesia Procedure Notes (Signed)
Procedure Name: Intubation Performed by: Verita Lamb, CRNA Pre-anesthesia Checklist: Patient identified, Emergency Drugs available, Suction available, Patient being monitored and Timeout performed Patient Re-evaluated:Patient Re-evaluated prior to induction Oxygen Delivery Method: Circle system utilized Preoxygenation: Pre-oxygenation with 100% oxygen Induction Type: IV induction Ventilation: Mask ventilation without difficulty Laryngoscope Size: Miller and 2 Grade View: Grade III Tube type: Oral Tube size: 7.0 mm Number of attempts: 1 Airway Equipment and Method: Stylet Placement Confirmation: positive ETCO2,  ETT inserted through vocal cords under direct vision,  CO2 detector and breath sounds checked- equal and bilateral Secured at: 21 cm Tube secured with: Tape Dental Injury: Teeth and Oropharynx as per pre-operative assessment

## 2017-12-23 ENCOUNTER — Encounter (HOSPITAL_BASED_OUTPATIENT_CLINIC_OR_DEPARTMENT_OTHER): Payer: Self-pay | Admitting: Plastic Surgery

## 2018-05-12 ENCOUNTER — Telehealth: Payer: Self-pay | Admitting: Hematology and Oncology

## 2018-05-12 NOTE — Telephone Encounter (Signed)
Called pt re appt change due to VG spoke with patient and confirmed appt

## 2018-05-14 NOTE — Progress Notes (Signed)
Patient Care Team: [name], MD as PCP - General (Family Medicine) Nicholas Lose, MD as Consulting Physician (Hematology and Oncology) [name], MD as Consulting Physician (General Surgery) Gery Pray, MD as Consulting Physician (Radiation Oncology) Gardenia Phlegm, NP as Nurse Practitioner (Hematology and Oncology)  DIAGNOSIS:    ICD-10-CM   1. Malignant neoplasm of upper-outer quadrant of right breast in female, estrogen receptor positive ([place]) C50.411 MM DIAG BREAST TOMO UNI LEFT   Z17.0     SUMMARY OF ONCOLOGIC HISTORY:   Malignant neoplasm of upper-outer quadrant of right breast in female, estrogen receptor positive ([place])   12/05/2016 Initial Diagnosis    Screening detected calcifications Rt breast spanning 1.5 cm, U/S measured a lesion 1.2 cm size, Biopsy revealed IDC Grade 3, ER/PR Negative, Her 2 Neg Ratio: 1.05; Ki 67: 20%; T1bN0 Stage 1A Clinical stage    12/24/2016 Genetic Testing    The patient had genetic testing due to a personal and family history of breast cancer.  The [facility] Common Hereditary Cancer Panel + Melanoma panel was sent out. The Hereditary Gene Panel + Melanoma Panel offered by [facility] includes sequencing and/or deletion duplication testing of the following 53 genes: APC, ATM, AXIN2, BAP1, BARD1, BMPR1A, BRCA1, BRCA2, BRIP1, CDH1, CDK4, CDKN2A (p14ARF), CDKN2A (p16INK4a), CHEK2, CTNNA1, DICER1, EPCAM (Deletion/duplication testing only), GREM1 (promoter region deletion/duplication testing only), KIT, MC1R, MEN1, MLH1, MSH2, MSH3, MSH6, MUTYH, NBN, NF1, NHTL1, PALB2, PDGFRA, PMS2, POLD1, POLE, POT1, PTEN, RAD50, RAD51C, RAD51D, RB1, SDHB, SDHC, SDHD, SMAD4, SMARCA4. STK11, TERT, TP53, TSC1, TSC2, and VHL.  The following genes were evaluated for sequence changes only: MITF, SDHA and HOXB13 c.251G>A variant only.  Results: No pathogenic mutations identified. 3 VU'sS in BRCA1 c.2967T>A (p.Phe989Leu) , MSH6 c.2857G>A (p.Glu953Lys), and TERT  c.902G>A (p.Arg301His) were identified.  The date of this test result is 12/24/2016.      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 Breast reconstruction 12/20/2017    09/2017 -  Anti-estrogen oral therapy    Tamoxifen daily     CHIEF COMPLIANT: Follow-up of Tamoxifen therapy  INTERVAL HISTORY: Lorraine Dean is a 53 y.o. 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 She is currently on anti-estrogen with Tamoxifen. She presents to the clinic today alone. She had a reconstruction in 12/2017 that is healing well. She reports mild hot flashes at night. She has tolerable muscle and joint pain, which she attributes to taking Lipitor. She notes abdominal cramps for which she is seeing her gynecologist. She reports her anxiety has slightly improved recently. Her next mammogram will be in a few weeks. She reviewed her medication list with me.   REVIEW OF SYSTEMS:   Constitutional: Denies fevers, chills or abnormal weight loss (+) mild hot flashes Eyes: Denies blurriness of vision Ears, nose, mouth, throat, and face: Denies mucositis or sore throat Respiratory: Denies cough,  dyspnea or wheezes Cardiovascular: Denies palpitation, chest discomfort Gastrointestinal:  Denies nausea, heartburn or change in bowel habits (+) abd cramps Skin: Denies abnormal skin rashes MSK: (+) muscle and joint pain Lymphatics: Denies new lymphadenopathy or  easy bruising Neurological:Denies numbness, tingling or new weaknesses Behavioral/Psych: Mood is stable, no new changes (+) anxiety Extremities: No lower extremity edema Breast: denies any pain or lumps or nodules in either breasts All other systems were reviewed with the patient and are negative.  I have reviewed the past medical history, past surgical history, social history and family history with the patient and they are unchanged from previous note.  ALLERGIES:  has No Known Allergies.  MEDICATIONS:  Current Outpatient Medications  Medication Sig Dispense Refill  . aspirin EC 81 MG tablet Take 81 mg by mouth 3 (three) times a week.     Marland Kitchen atorvastatin (LIPITOR) 10 MG tablet Take 10 mg by mouth every evening.    . Biotin w/ Vitamins C & E (HAIR/SKIN/NAILS PO) Take 1 tablet by mouth every evening.    . Calcium Carb-Cholecalciferol (CALCIUM 600+D3 PO) Take 1 tablet by mouth every evening.    . Cholecalciferol (VITAMIN D-3) 5000 units TABS Take 5,000 Units by mouth every evening.    Marland Kitchen GARCINIA CAMBOGIA-CHROMIUM PO Take 1 tablet by mouth 2 (two) times a week. Saturday & Sunday    . Krill Oil 350 MG CAPS Take 350 mg by mouth daily.    . mometasone (NASONEX) 50 MCG/ACT nasal spray 2 SPRAYS IN EACH NOSTRIL ONCE A DAY IF NEEDED NASALLY  2  . Multiple Vitamins-Minerals (OCUVITE ADULT FORMULA) CAPS Take by mouth.    . Multiple Vitamins-Minerals (OCUVITE PO) Take 1 tablet by mouth every evening.    . naproxen sodium (ALEVE) 220 MG tablet Take 220-440 mg by mouth 2 (two) times daily as needed (for pain.).    Marland Kitchen Probiotic Product (ALIGN PO) Take 1 capsule by mouth every evening.    . tamoxifen (NOLVADEX) 20 MG tablet Take 1 tablet (20 mg total) by mouth daily. 90 tablet 3   No current facility-administered medications for this visit.     PHYSICAL EXAMINATION: ECOG PERFORMANCE STATUS: 1 - Symptomatic but completely ambulatory  Vitals:   05/15/18 1100  BP: 131/61  Pulse: 76  Resp: 19  Temp:  98.6 F (37 C)  SpO2: 100%   Filed Weights   05/15/18 1100  Weight: 108 lb 6.4 oz (49.2 kg)    GENERAL:alert, no distress and comfortable SKIN: skin color, texture, turgor are normal, no rashes or significant lesions EYES: normal, Conjunctiva are pink and non-injected, sclera clear OROPHARYNX:no exudate, no erythema and lips, buccal mucosa, and tongue normal  NECK: supple, thyroid normal size, non-tender, without nodularity LYMPH:  no palpable lymphadenopathy in the cervical, axillary or inguinal LUNGS: clear to auscultation and percussion with normal breathing effort HEART: regular rate & rhythm and no murmurs and no lower extremity edema ABDOMEN:abdomen soft, non-tender and normal bowel sounds MUSCULOSKELETAL:no cyanosis of digits and no clubbing  NEURO: alert & oriented x 3 with fluent speech, no focal motor/sensory deficits EXTREMITIES: No lower extremity edema BREAST: No palpable masses or nodules in either right or left breasts.  Right breast has been reconstructed and without any nodularity.  No palpable axillary supraclavicular or infraclavicular adenopathy no breast tenderness or nipple discharge. (exam performed in the presence of a chaperone)  LABORATORY DATA:  I have reviewed the data as listed CMP Latest Ref Rng & Units 08/01/2017 06/14/2017 06/07/2017  Glucose 65 - 99 mg/dL 101(H) 85 108  BUN 6 - 20 mg/dL '10 8 10  '$ Creatinine 0.44 - 1.00 mg/dL 0.65 0.64 0.73  Sodium 135 - 145 mmol/L 140 142 143  Potassium 3.5 - 5.1 mmol/L 4.2 3.7 3.5  Chloride 101 - 111 mmol/L 104 107 108  CO2 22 - 32 mmol/L '25 25 24  '$ Calcium 8.9 - 10.3 mg/dL 9.2 9.3 9.3  Total Protein 6.4 - 8.3 g/dL - 6.4 6.5  Total Bilirubin 0.2 - 1.2 mg/dL - 0.2 0.2  Alkaline Phos 40 - 150 U/L - 70 71  AST 5 - 34 U/L - 23 24  ALT 0 - 55 U/L - 25 29    Lab Results  Component Value Date   WBC 7.3 08/01/2017   HGB 14.0 08/01/2017   HCT 42.6 08/01/2017   MCV 89.5 08/01/2017   PLT 184 08/01/2017   NEUTROABS 2.3  06/14/2017    ASSESSMENT & PLAN:  Malignant neoplasm of upper-outer quadrant of right breast in female, estrogen receptor positive ([place]) 12/28/2016: Right lumpectomy: IDC grade 2, 1.2 cm, broadly 0.1 cm to the posterior margin, 0/5 lymph nodes negative, ER 0%, PR 0%, HER-2 negative ratio 1.05, Ki-67 5%, T1 cN0 stage IB AJCC 8  Recommendation: 1. Adjuvant chemotherapy with dose dense Adriamycin and Cytoxan every 2 weeks 4 followed by weekly Taxol 12 completed 06/14/2017 2. followed by adjuvant radiation therapy ---------------------------------------------------------------------- New diagnosis of DCIS estrogen receptor positive:  08/08/2017 right mastectomy: No evidence of residual breast cancer.  Current treatment: Tamoxifen 20 mg daily started 08/14/2017  Tamoxifen toxicities: Mild hot flashes Denies any arthralgias or myalgias  Breast cancer surveillance: 1.  Breast exam 05/15/2018: No palpable lumps or nodules of concern 2.  Mammogram on the left breast will be scheduled 06/05/2018  Return to clinic in 6 months for follow-up    Orders Placed This Encounter  Procedures  . MM DIAG BREAST TOMO UNI LEFT    Standing Status:   Future    Standing Expiration Date:   05/16/2019    Order Specific Question:   Reason for Exam (SYMPTOM  OR DIAGNOSIS REQUIRED)    Answer:   Left mammogram with H/O right breast cancer    Order Specific Question:   Is the patient pregnant?    Answer:   No    Order Specific Question:   Preferred imaging location?    Answer:   External    Comments:   [facility]   The patient has a good understanding of the overall plan. she agrees with it. she will call with any problems that may develop before the next visit here.  Nicholas Lose, MD 05/15/2018   I, Molly Dorshimer, am acting as scribe for Nicholas Lose, MD.  I have reviewed the above documentation for accuracy and completeness, and I agree with the above.

## 2018-05-15 ENCOUNTER — Telehealth: Payer: Self-pay | Admitting: Hematology and Oncology

## 2018-05-15 ENCOUNTER — Inpatient Hospital Stay: Payer: BLUE CROSS/BLUE SHIELD | Attending: Hematology and Oncology | Admitting: Hematology and Oncology

## 2018-05-15 DIAGNOSIS — C50411 Malignant neoplasm of upper-outer quadrant of right female breast: Secondary | ICD-10-CM | POA: Diagnosis present

## 2018-05-15 DIAGNOSIS — Z9011 Acquired absence of right breast and nipple: Secondary | ICD-10-CM

## 2018-05-15 DIAGNOSIS — Z79810 Long term (current) use of selective estrogen receptor modulators (SERMs): Secondary | ICD-10-CM | POA: Diagnosis not present

## 2018-05-15 DIAGNOSIS — Z170 Estrogen receptor positive status [ER+]: Secondary | ICD-10-CM

## 2018-05-15 DIAGNOSIS — Z9221 Personal history of antineoplastic chemotherapy: Secondary | ICD-10-CM | POA: Insufficient documentation

## 2018-05-15 NOTE — Assessment & Plan Note (Addendum)
12/28/2016: Right lumpectomy: IDC grade 2, 1.2 cm, broadly 0.1 cm to the posterior margin, 0/5 lymph nodes negative, ER 0%, PR 0%, HER-2 negative ratio 1.05, Ki-67 5%, T1 cN0 stage IB AJCC 8  Recommendation: 1. Adjuvant chemotherapy with dose dense Adriamycin and Cytoxan every 2 weeks 4 followed by weekly Taxol 12 completed 06/14/2017 2. followed by adjuvant radiation therapy ---------------------------------------------------------------------- New diagnosis of DCIS estrogen receptor positive:  08/08/2017 right mastectomy: No evidence of residual breast cancer.  Current treatment: Tamoxifen 20 mg daily started 08/14/2017  Tamoxifen toxicities: Mild hot flashes Denies any arthralgias or myalgias  Breast cancer surveillance: 1.  Breast exam 05/15/2018: No palpable lumps or nodules of concern 2.  Mammogram on the left breast will be scheduled 06/05/2018  Return to clinic in 6 months for follow-up

## 2018-05-15 NOTE — Telephone Encounter (Signed)
Gave avs and calendar also faxed mammo

## 2018-05-15 NOTE — Telephone Encounter (Signed)
Gave avs and calendar ° °

## 2018-05-22 ENCOUNTER — Encounter: Payer: Self-pay | Admitting: Hematology and Oncology

## 2018-06-17 ENCOUNTER — Encounter: Payer: Self-pay | Admitting: Hematology and Oncology

## 2018-06-17 ENCOUNTER — Other Ambulatory Visit: Payer: Self-pay | Admitting: Nurse Practitioner

## 2018-07-24 ENCOUNTER — Other Ambulatory Visit: Payer: Self-pay | Admitting: Hematology and Oncology

## 2018-09-19 IMAGING — CR DG CHEST 2V
2 series · 2 of 2 positions shown · non-contrast
Comparison: 01/18/2017

CLINICAL DATA: Cough and fever. Currently undergoing chemotherapy
for breast cancer.

EXAM:
CHEST  2 VIEW

[w chest pa]
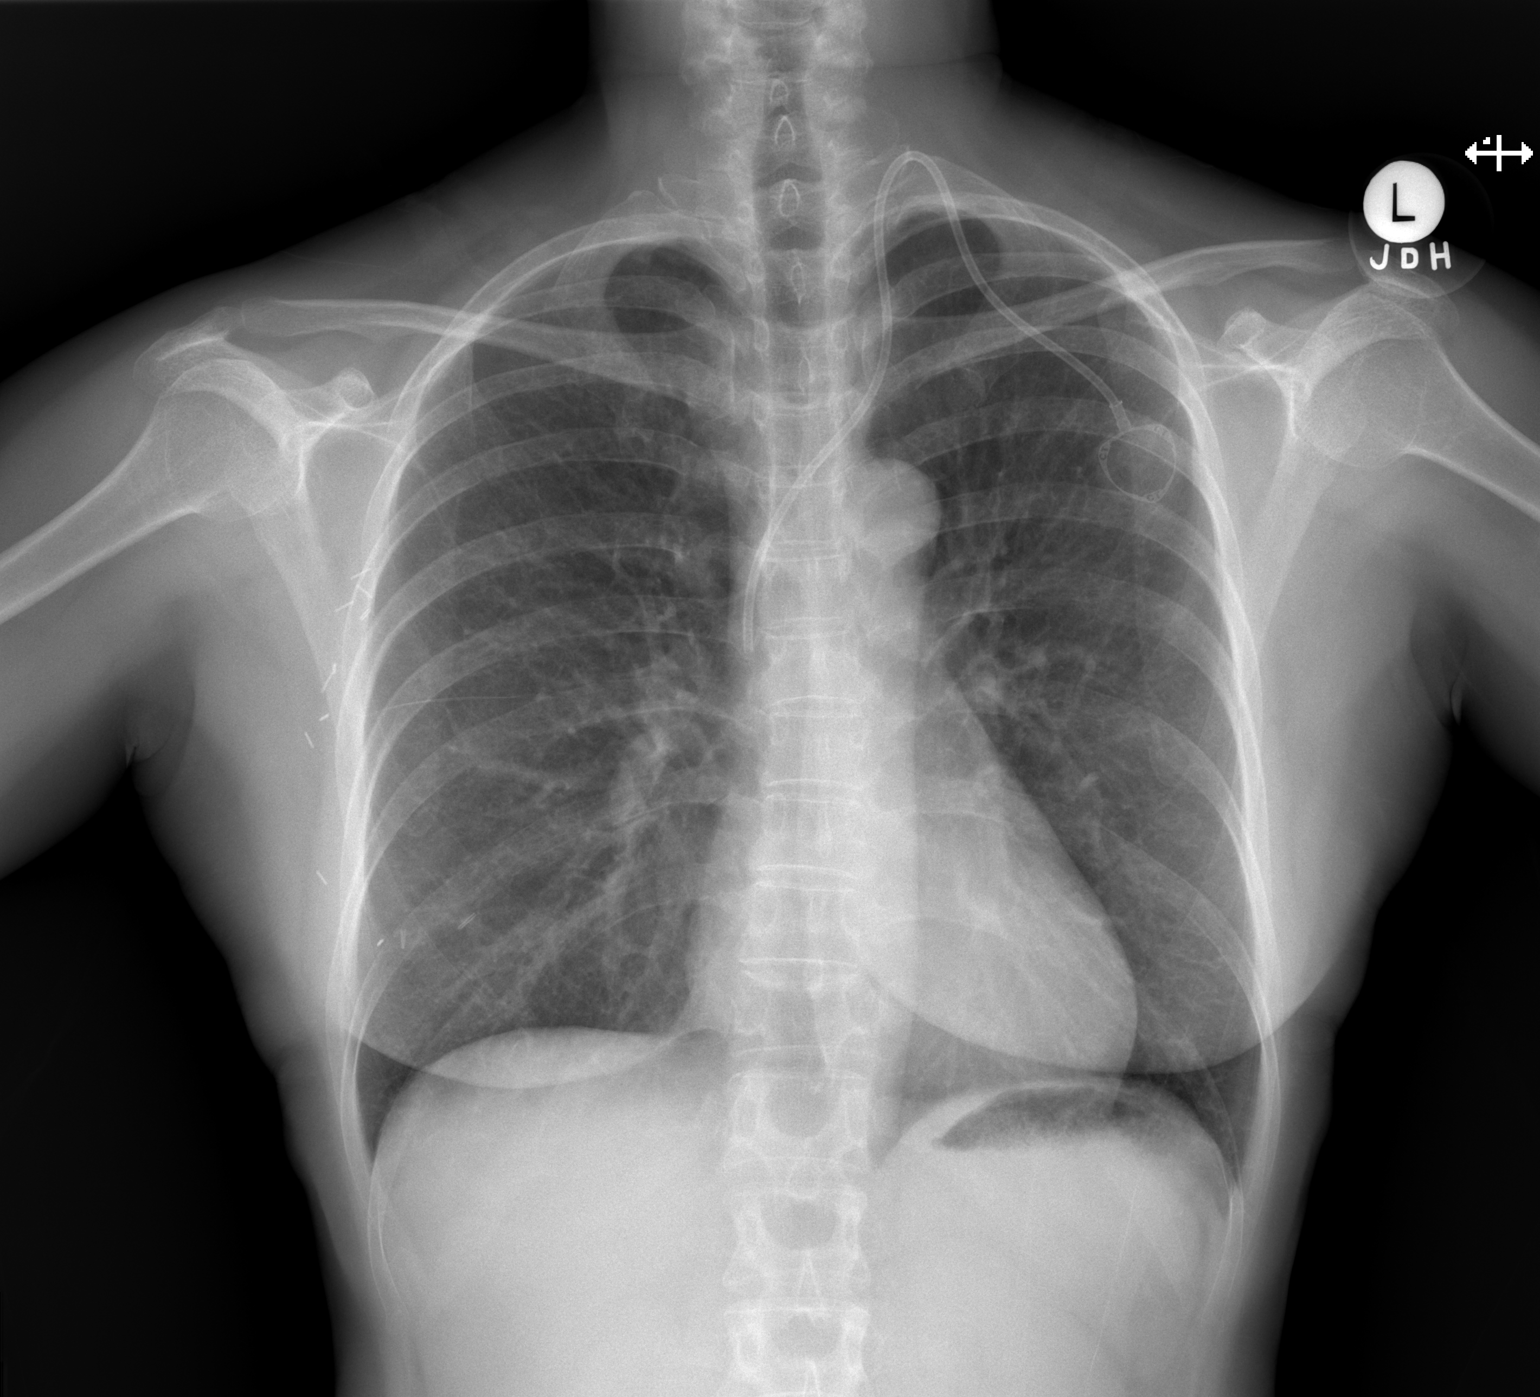

[w chest lat]
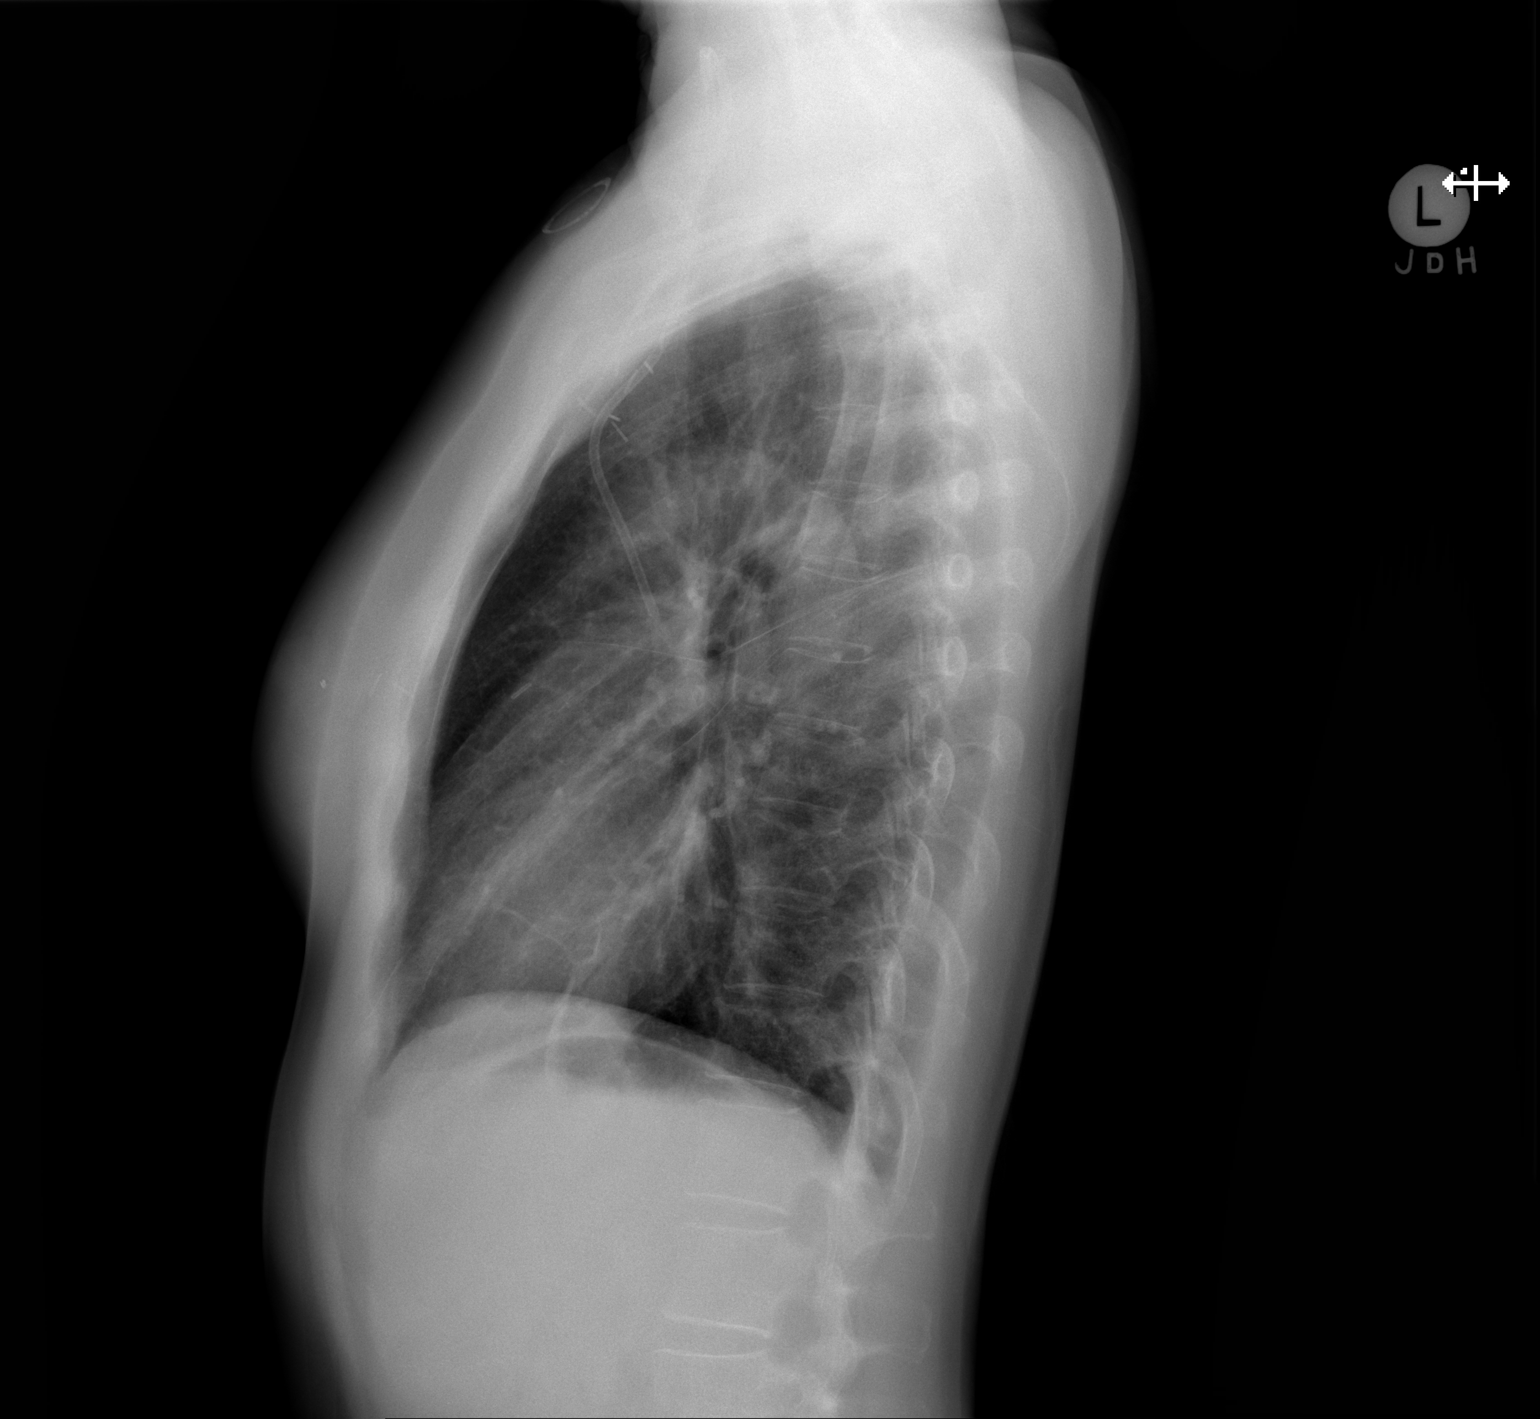

[2 of 2 positions shown; findings below may reference images not displayed]

FINDINGS: Heart size and pulmonary vascularity are normal. Power port appears
in good position. Slight probable linear scarring at one of the lung
bases on the lateral view. No consolidation or effusion. No acute
bone abnormality. Multiple surgical clips in the right breast.
IMPRESSION: No active cardiopulmonary disease.

## 2018-09-23 IMAGING — CR DG CHEST 2V
2 series · 2 of 2 positions shown · non-contrast
Comparison: PA and lateral chest 04/22/2017.

CLINICAL DATA: Fever and cough for 1 week. History of breast
carcinoma.

EXAM:
CHEST  2 VIEW

[w chest pa]
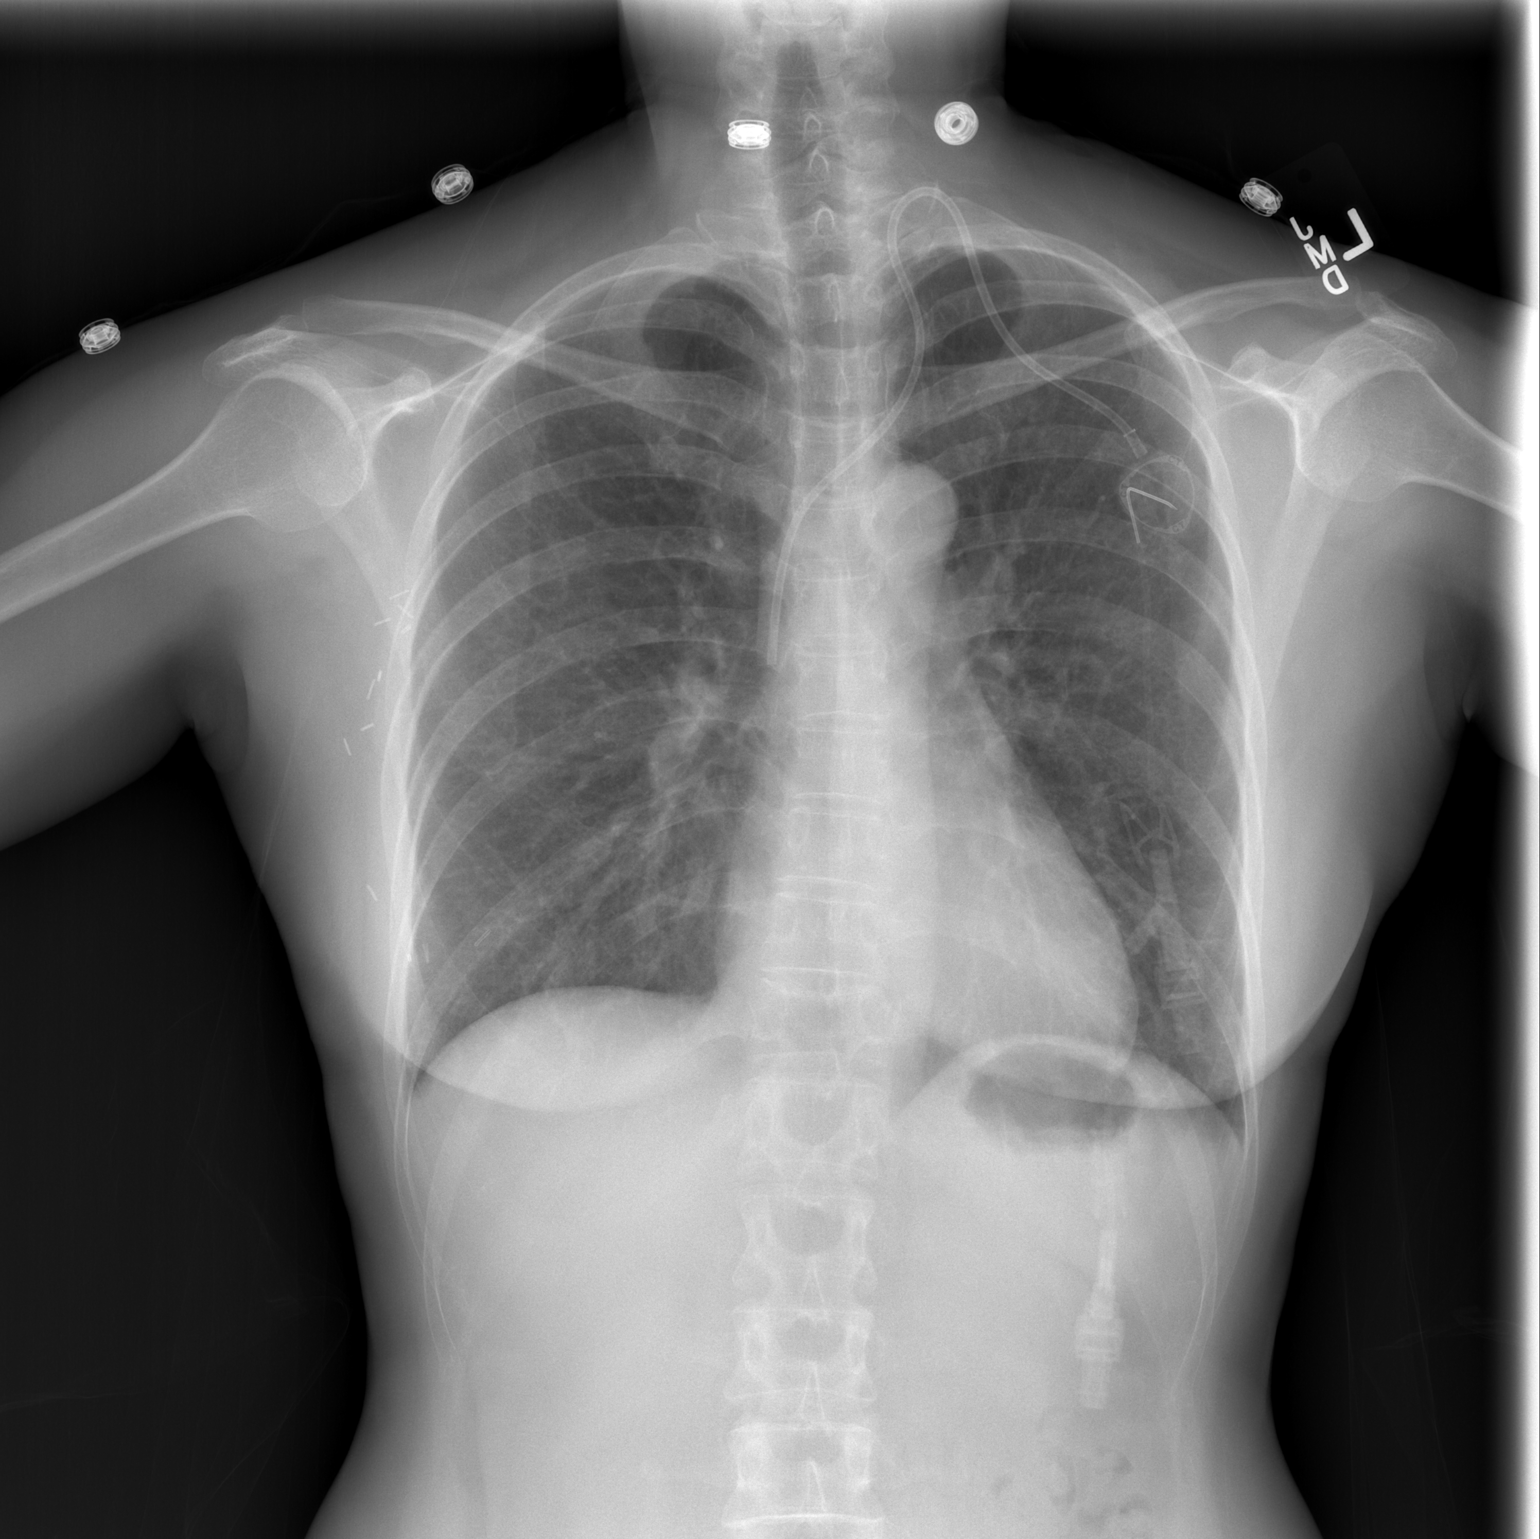

[w chest lat]
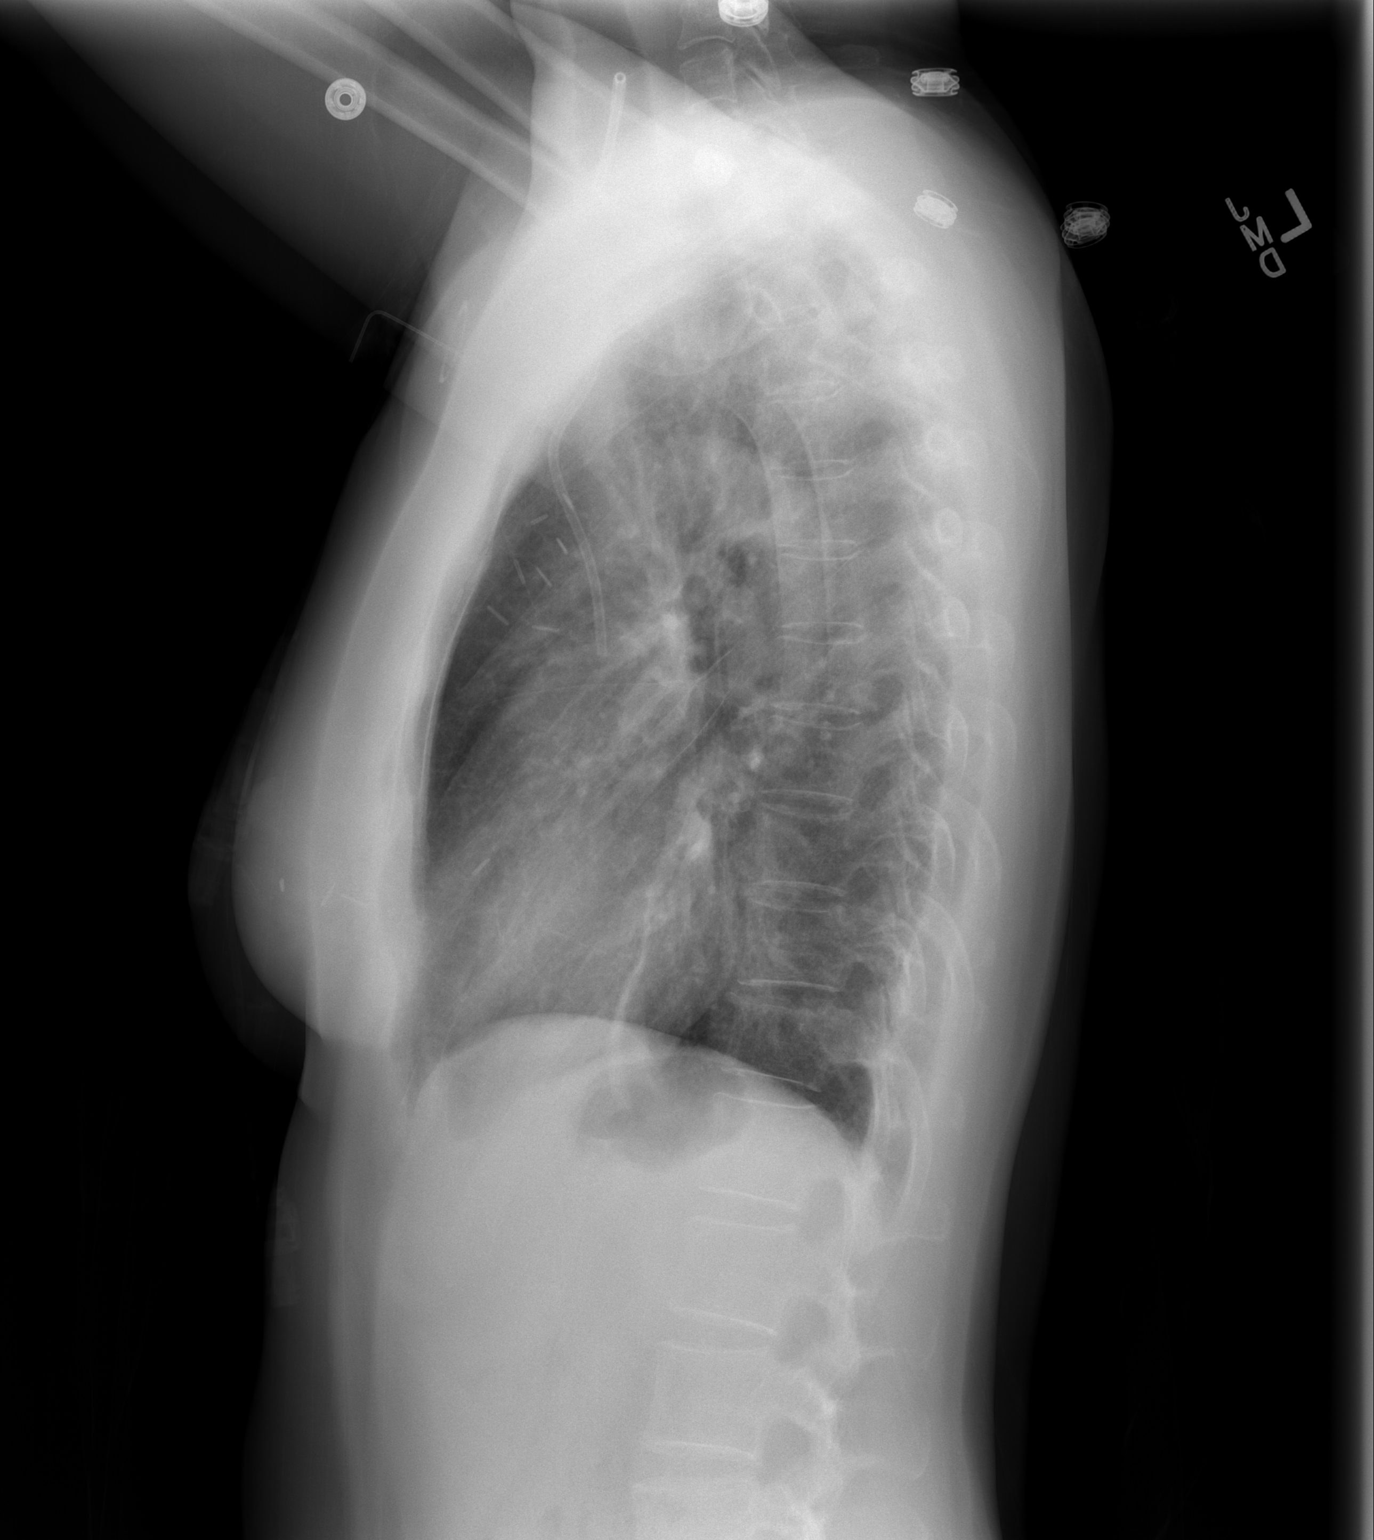

[2 of 2 positions shown; findings below may reference images not displayed]

FINDINGS: Port-A-Cath is unchanged. Postoperative change right breast and
axilla are again seen. Lungs are clear without consolidative
process, pneumothorax or effusion. No focal bony abnormality.
IMPRESSION: No acute disease.  Stable compared to prior exam.

## 2018-11-10 ENCOUNTER — Telehealth: Payer: Self-pay | Admitting: Hematology and Oncology

## 2018-11-10 NOTE — Assessment & Plan Note (Signed)
12/28/2016: Right lumpectomy: IDC grade 2, 1.2 cm, broadly 0.1 cm to the posterior margin, 0/5 lymph nodes negative, ER 0%, PR 0%, HER-2 negative ratio 1.05, Ki-67 5%, T1 cN0 stage IB AJCC 8  Recommendation: 1. Adjuvant chemotherapy with dose dense Adriamycin and Cytoxan every 2 weeks 4 followed by weekly Taxol 12 completed 06/14/2017 2. followed by adjuvant radiation therapy ---------------------------------------------------------------------- New diagnosis of DCIS estrogen receptor positive:  08/08/2017 right mastectomy: No evidence of residual breast cancer.  Current treatment: Tamoxifen 20 mg daily started 08/14/2017  Tamoxifen toxicities: Mild hot flashes Denies any arthralgias or myalgias  Breast cancer surveillance: 1.  Breast exam 05/15/2018: No palpable lumps or nodules of concern 2.  Mammogram 06/05/2018 at [facility]: Benign, breast density category D I discussed with the patient about periodic breast MRIs given her very high breast density.  Return to clinic in 6 months for follow-up

## 2018-11-10 NOTE — Telephone Encounter (Signed)
I left a message regarding video visit  °

## 2018-11-12 ENCOUNTER — Telehealth: Payer: Self-pay | Admitting: Hematology and Oncology

## 2018-11-12 NOTE — Telephone Encounter (Signed)
Contacted patient to verify mychart visit for pre reg 

## 2018-11-12 NOTE — Progress Notes (Signed)
HEMATOLOGY-ONCOLOGY MYCHART VIDEO VISIT PROGRESS NOTE  I connected with Lorraine Dean on 11/13/2018 at  3:15 PM EDT by MyChart video conference and verified that I am speaking with the correct person using two identifiers.  I discussed the limitations, risks, security and privacy concerns of performing an evaluation and management service by MyChart and the availability of in person appointments.  I also discussed with the patient that there may be a patient responsible charge related to this service. The patient expressed understanding and agreed to proceed.  Patient's Location: Home Physician Location: Clinic  CHIEF COMPLIANT: Follow-up of Tamoxifen therapy  INTERVAL HISTORY: Lorraine Dean is a 53 y.o. 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 She is currently on anti-estrogen therapy with tamoxifen. I last saw her 6 months ago. Mammogram on 05/22/18 showed no evidence of malignancy. She presents over MyChart today for 6 month follow-up.   Oncology History  Malignant neoplasm of upper-outer quadrant of right breast in female, estrogen receptor positive ([place])  12/05/2016 Initial Diagnosis   Screening detected calcifications Rt breast spanning 1.5 cm, U/S measured a lesion 1.2 cm size, Biopsy revealed IDC Grade 3, ER/PR Negative, Her 2 Neg Ratio: 1.05; Ki 67: 20%; T1bN0 Stage 1A Clinical stage   12/24/2016 Genetic Testing   The patient had genetic testing due to a personal and family history of breast cancer.  The [facility] Common Hereditary Cancer Panel + Melanoma panel was sent out. The Hereditary Gene Panel + Melanoma Panel offered by [facility] includes sequencing and/or deletion duplication testing of the following 53 genes: APC, ATM, AXIN2, BAP1, BARD1, BMPR1A, BRCA1, BRCA2, BRIP1, CDH1, CDK4, CDKN2A (p14ARF), CDKN2A (p16INK4a), CHEK2, CTNNA1, DICER1, EPCAM  (Deletion/duplication testing only), GREM1 (promoter region deletion/duplication testing only), KIT, MC1R, MEN1, MLH1, MSH2, MSH3, MSH6, MUTYH, NBN, NF1, NHTL1, PALB2, PDGFRA, PMS2, POLD1, POLE, POT1, PTEN, RAD50, RAD51C, RAD51D, RB1, SDHB, SDHC, SDHD, SMAD4, SMARCA4. STK11, TERT, TP53, TSC1, TSC2, and VHL.  The following genes were evaluated for sequence changes only: MITF, SDHA and HOXB13 c.251G>A variant only.  Results: No pathogenic mutations identified. 3 VU'sS in BRCA1 c.2967T>A (p.Phe989Leu) , MSH6 c.2857G>A (p.Glu953Lys), and TERT c.902G>A (p.Arg301His) were identified.  The date of this test result is 12/24/2016.     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 Breast reconstruction 12/20/2017   09/2017 -  Anti-estrogen oral therapy   Tamoxifen daily     REVIEW OF SYSTEMS:   Constitutional: Denies fevers, chills or abnormal weight loss Eyes: Denies blurriness of vision Ears, nose, mouth, throat, and face: Denies mucositis or sore throat Respiratory: Denies cough, dyspnea or wheezes Cardiovascular: Denies palpitation, chest discomfort Gastrointestinal:  Denies nausea, heartburn or change in bowel habits Skin: Denies abnormal skin rashes Lymphatics: Denies new lymphadenopathy or easy bruising Neurological:Denies numbness, tingling or new weaknesses Behavioral/Psych: Mood is stable, no new changes  Extremities: No lower extremity edema Breast: denies any pain or lumps  or nodules in either breasts All other systems were reviewed with the patient  and are negative.  Observations/Objective:  There were no vitals filed for this visit. There is no height or weight on file to calculate BMI.  I have reviewed the data as listed CMP Latest Ref Rng & Units 08/01/2017 06/14/2017 06/07/2017  Glucose 65 - 99 mg/dL 101(H) 85 108  BUN 6 - 20 mg/dL '10 8 10  '$ Creatinine 0.44 - 1.00 mg/dL 0.65 0.64 0.73  Sodium 135 - 145 mmol/L 140 142 143  Potassium 3.5 - 5.1 mmol/L 4.2 3.7 3.5  Chloride 101 - 111 mmol/L 104 107 108  CO2 22 - 32 mmol/L '25 25 24  '$ Calcium 8.9 - 10.3 mg/dL 9.2 9.3 9.3  Total Protein 6.4 - 8.3 g/dL - 6.4 6.5  Total Bilirubin 0.2 - 1.2 mg/dL - 0.2 0.2  Alkaline Phos 40 - 150 U/L - 70 71  AST 5 - 34 U/L - 23 24  ALT 0 - 55 U/L - 25 29    Lab Results  Component Value Date   WBC 7.3 08/01/2017   HGB 14.0 08/01/2017   HCT 42.6 08/01/2017   MCV 89.5 08/01/2017   PLT 184 08/01/2017   NEUTROABS 2.3 06/14/2017      Assessment Plan:  Malignant neoplasm of upper-outer quadrant of right breast in female, estrogen receptor positive ([place]) 12/28/2016: Right lumpectomy: IDC grade 2, 1.2 cm, broadly 0.1 cm to the posterior margin, 0/5 lymph nodes negative, ER 0%, PR 0%, HER-2 negative ratio 1.05, Ki-67 5%, T1 cN0 stage IB AJCC 8  Recommendation: 1. Adjuvant chemotherapy with dose dense Adriamycin and Cytoxan every 2 weeks 4 followed by weekly Taxol 12 completed 06/14/2017 2. followed by adjuvant radiation therapy ---------------------------------------------------------------------- New diagnosis of DCIS estrogen receptor positive:  08/08/2017 right mastectomy: No evidence of residual breast cancer.  Current treatment: Tamoxifen 20 mg daily started 08/14/2017  Tamoxifen toxicities: Mild hot flashes Denies any arthralgias or myalgias  Breast cancer surveillance: 1.  Breast exam 05/15/2018: No palpable lumps or nodules of concern 2.  Mammogram 06/05/2018 at [facility]: Benign, breast density category D I discussed with the patient about  periodic breast MRIs given her very high breast density.  Return to clinic in 6 months for follow-up  I discussed the assessment and treatment plan with the patient. The patient was provided an opportunity to ask questions and all were answered. The patient agreed with the plan and demonstrated an understanding of the instructions. The patient was advised to call back or seek an in-person evaluation if the symptoms worsen or if the condition fails to improve as anticipated.   I provided 15 minutes of face-to-face MyChart video visit time during this encounter.    Rulon Eisenmenger, MD 11/13/2018   I, Molly Dorshimer, am acting as scribe for Nicholas Lose, MD.  I have reviewed the above documentation for accuracy and completeness, and I agree with the above.

## 2018-11-13 ENCOUNTER — Inpatient Hospital Stay: Payer: BLUE CROSS/BLUE SHIELD | Attending: Hematology and Oncology | Admitting: Hematology and Oncology

## 2018-11-13 DIAGNOSIS — C50411 Malignant neoplasm of upper-outer quadrant of right female breast: Secondary | ICD-10-CM

## 2018-11-13 DIAGNOSIS — R232 Flushing: Secondary | ICD-10-CM | POA: Diagnosis not present

## 2018-11-13 DIAGNOSIS — Z9011 Acquired absence of right breast and nipple: Secondary | ICD-10-CM | POA: Diagnosis not present

## 2018-11-13 DIAGNOSIS — Z79899 Other long term (current) drug therapy: Secondary | ICD-10-CM | POA: Diagnosis not present

## 2018-11-13 DIAGNOSIS — Z170 Estrogen receptor positive status [ER+]: Secondary | ICD-10-CM

## 2018-11-13 DIAGNOSIS — Z803 Family history of malignant neoplasm of breast: Secondary | ICD-10-CM | POA: Diagnosis not present

## 2018-11-13 DIAGNOSIS — Z1231 Encounter for screening mammogram for malignant neoplasm of breast: Secondary | ICD-10-CM

## 2018-11-13 MED ORDER — TAMOXIFEN CITRATE 20 MG PO TABS: 20.0000 mg | ORAL_TABLET | Freq: Every day | ORAL | 3 refills | Status: DC

## 2018-12-08 IMAGING — CT CT ABD-PELV W/ CM
2 of 5 series · 14 of 46 positions shown, 16 images · IV contrast (APPLIED)
Comparison: No priors.

CLINICAL DATA: 52-year-old female with with history of breast
cancer in the upper outer quadrant of the right breast, status post
lumpectomy and chemotherapy.

EXAM:
CT CHEST, ABDOMEN, AND PELVIS WITH CONTRAST
TECHNIQUE: Multidetector CT imaging of the chest, abdomen and pelvis was
performed following the standard protocol during bolus
administration of intravenous contrast.
CONTRAST:  100mL [id number] IOPAMIDOL ([id number]) INJECTION 61%

[Series 2: cap with · axial · 0.64mm/px · z∈[-663,-163]mm · 11 of 120 slices shown, 13 images]
[im 10/120  soft-tissue]
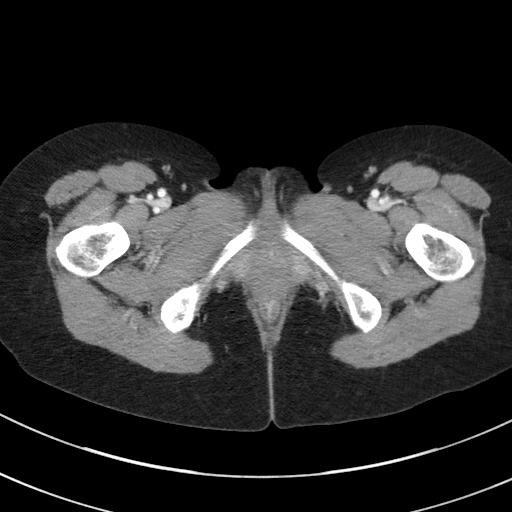
[im 10/120  bone]
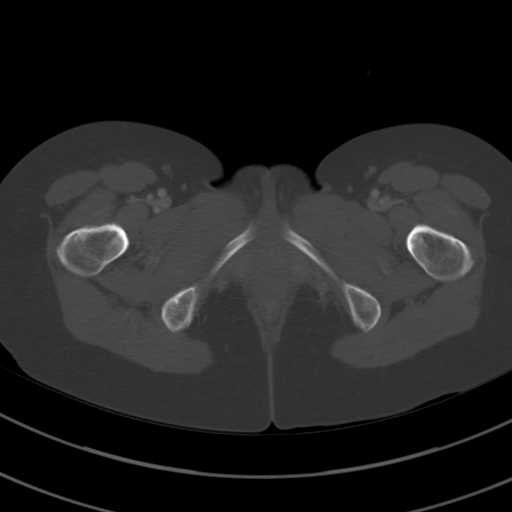
[im 20/120  soft-tissue]
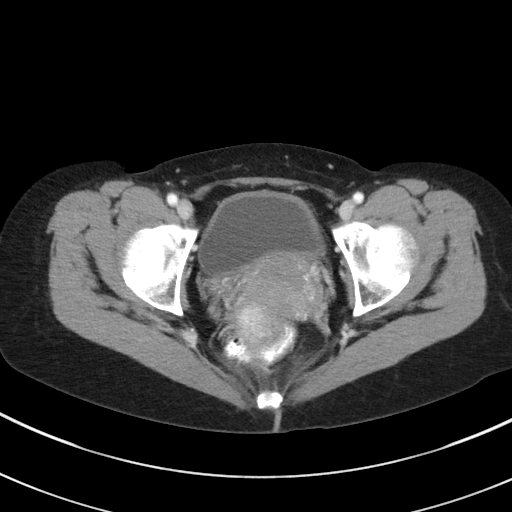
[im 30/120  soft-tissue]
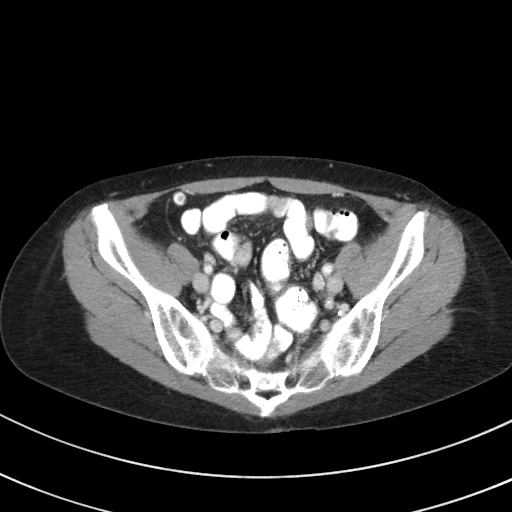
[im 40/120  soft-tissue]
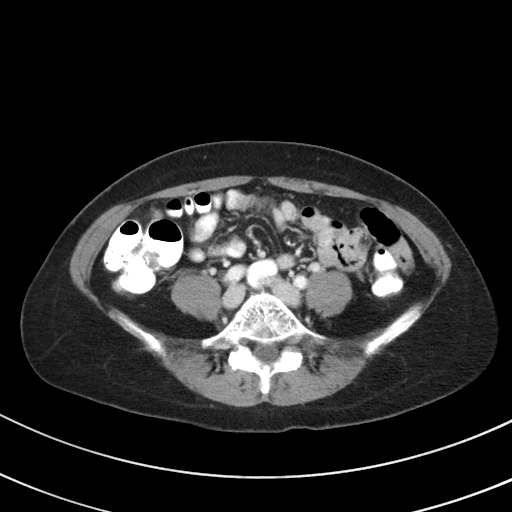
[im 50/120  soft-tissue]
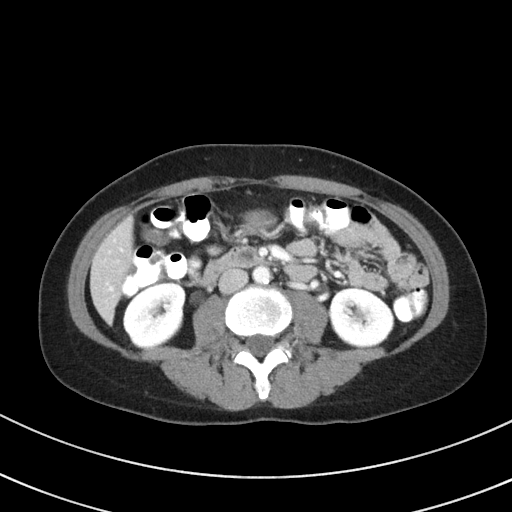
[im 60/120  soft-tissue]
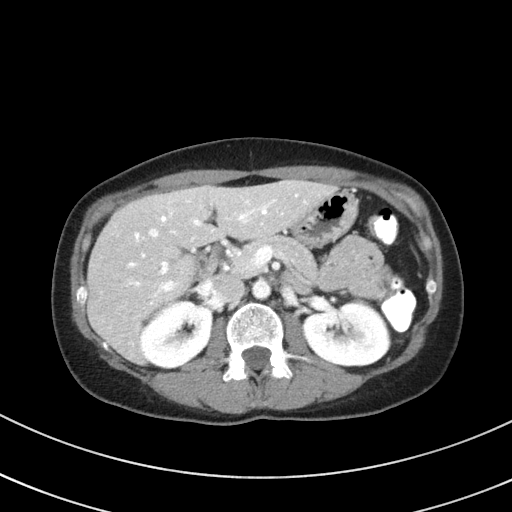
[im 70/120  soft-tissue]
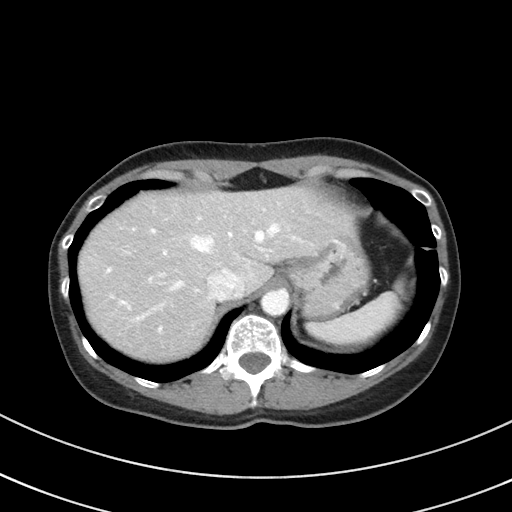
[im 80/120  soft-tissue]
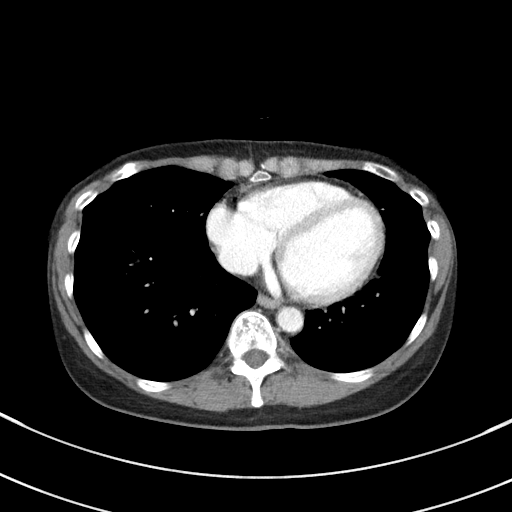
[im 90/120  soft-tissue]
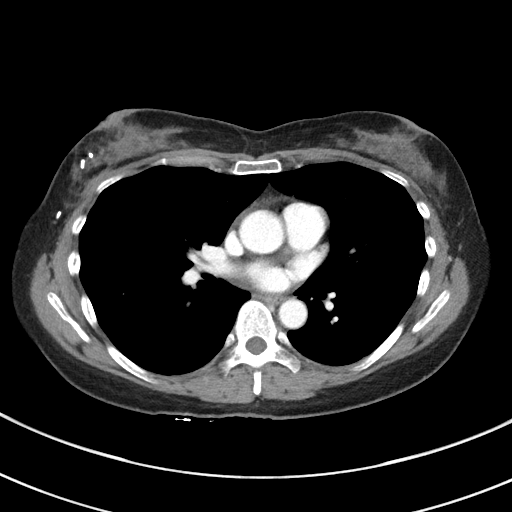
[im 90/120  bone]
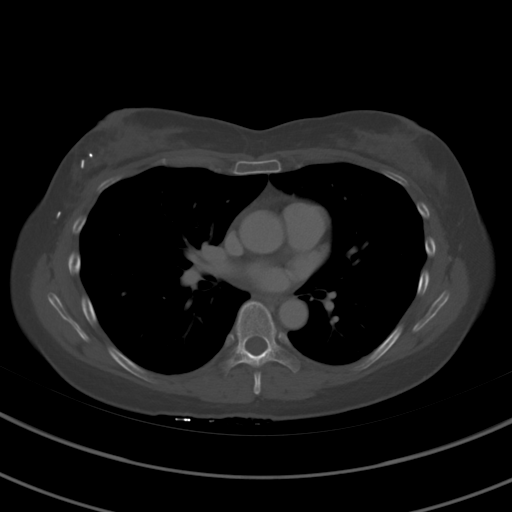
[im 100/120  soft-tissue]
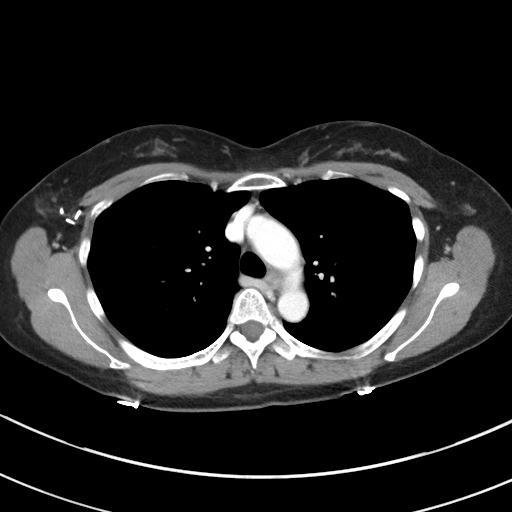
[im 110/120  soft-tissue]
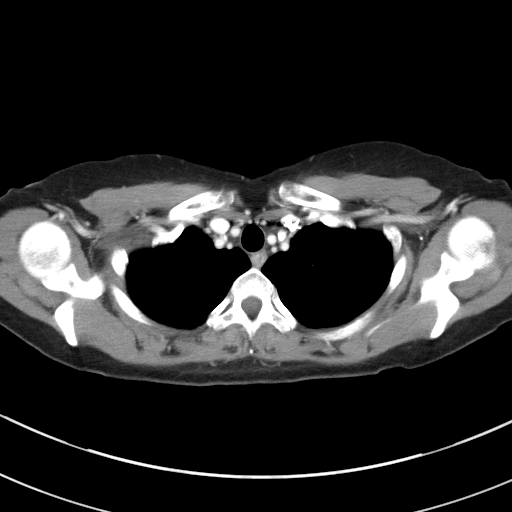

[Series 4: coronals · coronal · 0.70mm/px · 3 of 116 slices shown]
[im 39/116  soft-tissue]
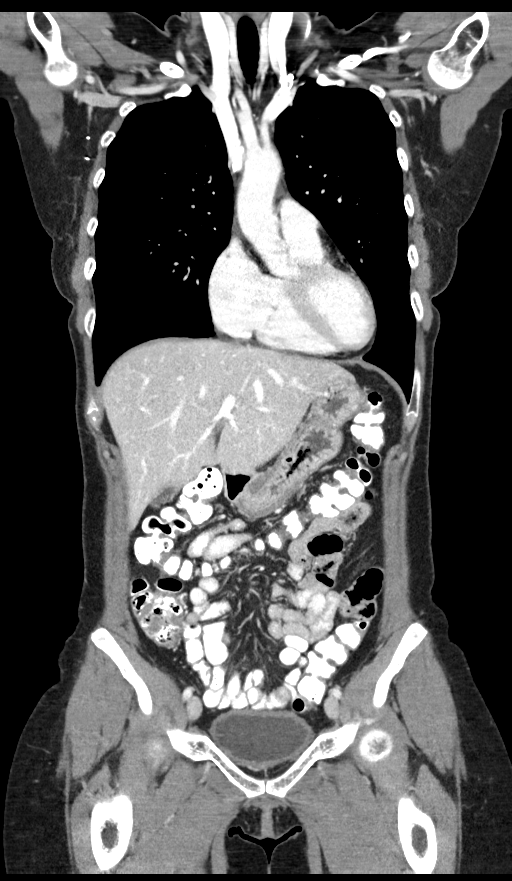
[im 52/116  soft-tissue]
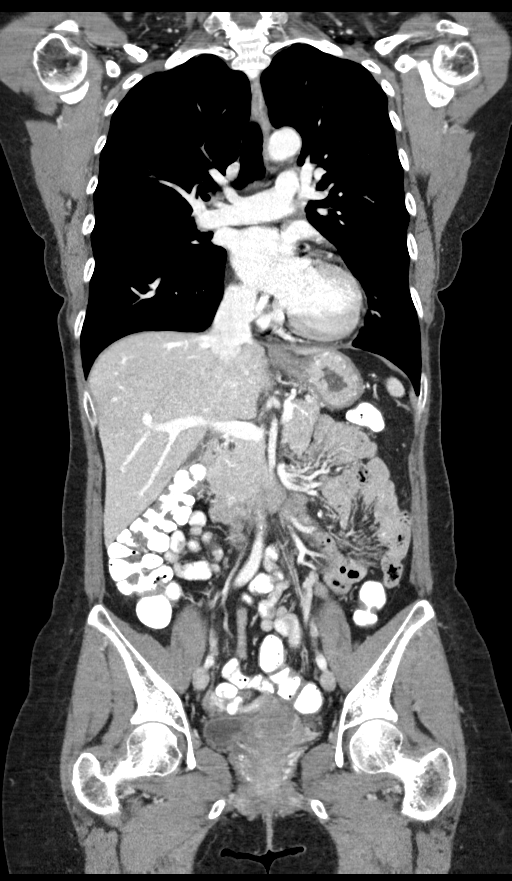
[im 64/116  soft-tissue]
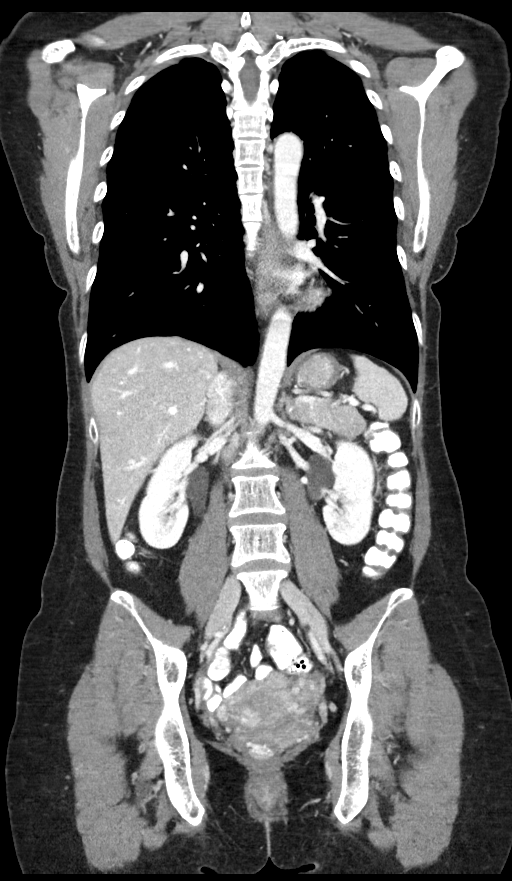

[14 of 46 positions shown; findings below may reference images not displayed]

FINDINGS: CT CHEST FINDINGS

Cardiovascular: Heart size is normal. There is no significant
pericardial fluid, thickening or pericardial calcification. No
atherosclerotic calcifications in the thoracic aorta or the coronary
arteries. Left-sided internal jugular single-lumen porta cath with
tip terminating in the distal superior vena cava.

Mediastinum/Nodes: No pathologically enlarged mediastinal or hilar
lymph nodes. Esophagus is unremarkable in appearance. No axillary
lymphadenopathy. Numerous surgical clips in the right axilla.

Lungs/Pleura: No suspicious appearing pulmonary nodules or masses.
No acute consolidative airspace disease. No pleural effusions.
Linear scarring in the lateral aspect of the left lower lobe.

Musculoskeletal: Postoperative changes of lumpectomy are noted in
the right breast. There are no aggressive appearing lytic or blastic
lesions noted in the visualized portions of the skeleton.

CT ABDOMEN PELVIS FINDINGS

Hepatobiliary: No suspicious cystic or solid hepatic lesions. No
intra or extrahepatic biliary ductal dilatation. Gallbladder is
normal in appearance.

Pancreas: No pancreatic mass. No pancreatic ductal dilatation. No
pancreatic or peripancreatic fluid or inflammatory changes.

Spleen: Unremarkable.

Adrenals/Urinary Tract: Bilateral kidneys and bilateral adrenal
glands are normal in appearance. No hydroureteronephrosis. Urinary
bladder is normal in appearance.

Stomach/Bowel: Normal appearance of the stomach. No pathologic
dilatation of small bowel or colon. Normal appendix.

Vascular/Lymphatic: Aortic atherosclerosis, without evidence of
aneurysm or dissection in the abdominal or pelvic vasculature. No
lymphadenopathy noted in the abdomen or pelvis.

Reproductive: Uterus is retroverted. Exophytic heterogeneously
enhancing lesion extending off the fundus of the uterus measuring
3.7 x 4.3 x 3.6 cm, likely to represent a fibroid. Bilateral ovaries
are unremarkable in appearance.

Other: No significant volume of ascites.  No pneumoperitoneum.

Musculoskeletal: There are no aggressive appearing lytic or blastic
lesions noted in the visualized portions of the skeleton.
IMPRESSION: 1. Status post lumpectomy in the right breast and right axillary
lymph node dissection. No findings to suggest metastatic disease in
the chest, abdomen or pelvis.
[DATE] x 4.3 x 3.6 cm lesion extending off the uterine fundus,
presumably a fibroid.

## 2019-05-25 ENCOUNTER — Encounter: Payer: Self-pay | Admitting: Hematology and Oncology

## 2019-07-13 ENCOUNTER — Ambulatory Visit: Payer: BC Managed Care – PPO | Attending: Internal Medicine

## 2019-07-13 DIAGNOSIS — Z23 Encounter for immunization: Secondary | ICD-10-CM | POA: Insufficient documentation

## 2019-07-13 NOTE — Progress Notes (Signed)
   Covid-19 Vaccination Clinic  Name:  LILIBET LINNEAR    MRN: [id number] DOB: [birth year]  07/13/2019  Ms. Girouard was observed post Covid-19 immunization for 15 minutes without incident. She was provided with Vaccine Information Sheet and instruction to access the V-Safe system.   Ms. Trevett was instructed to call 911 with any severe reactions post vaccine: Marland Kitchen Difficulty breathing  . Swelling of face and throat  . A fast heartbeat  . A bad rash all over body  . Dizziness and weakness   Immunizations Administered    Name Date Dose VIS Date Route   Pfizer COVID-19 Vaccine 07/13/2019 11:46 AM 0.3 mL 04/17/2019 Intramuscular   Manufacturer: [place]   Lot: [id number]   [place]: [id number]

## 2019-07-22 ENCOUNTER — Encounter: Payer: Self-pay | Admitting: General Practice

## 2019-08-05 ENCOUNTER — Other Ambulatory Visit: Payer: Self-pay

## 2019-08-05 ENCOUNTER — Ambulatory Visit: Payer: BC Managed Care – PPO | Attending: Internal Medicine

## 2019-08-05 DIAGNOSIS — Z23 Encounter for immunization: Secondary | ICD-10-CM

## 2019-08-05 NOTE — Progress Notes (Signed)
   Covid-19 Vaccination Clinic  Name:  Lorraine Dean    MRN: [id number] DOB: May 18, 1965  08/05/2019  [name] was observed post Covid-19 immunization for 15 minutes without incident. She was provided with Vaccine Information Sheet and instruction to access the V-Safe system.   [name] was instructed to call 911 with any severe reactions post vaccine: Marland Kitchen Difficulty breathing  . Swelling of face and throat  . A fast heartbeat  . A bad rash all over body  . Dizziness and weakness   Immunizations Administered    Name Date Dose VIS Date Route   Pfizer COVID-19 Vaccine 08/05/2019 12:57 PM 0.3 mL 04/17/2019 Intramuscular   Manufacturer: [place]   Lot: [id number]   [place]: [id number]

## 2019-11-03 ENCOUNTER — Ambulatory Visit
Admission: RE | Admit: 2019-11-03 | Discharge: 2019-11-03 | Disposition: A | Discharge status: Home / Self Care | Payer: BC Managed Care – PPO | Source: Ambulatory Visit | Attending: Hematology and Oncology | Admitting: Hematology and Oncology

## 2019-11-03 DIAGNOSIS — Z1231 Encounter for screening mammogram for malignant neoplasm of breast: Secondary | ICD-10-CM

## 2019-11-03 MED ORDER — GADOBUTROL 1 MMOL/ML IV SOLN
5.0000 mL | Freq: Once | INTRAVENOUS | Status: AC | PRN
  Administered 2019-11-03: 5 mL via INTRAVENOUS

## 2019-11-12 NOTE — Progress Notes (Signed)
Patient Care Team: [name], MD as PCP - General (Family Medicine) Nicholas Lose, MD as Consulting Physician (Hematology and Oncology) [name], MD as Consulting Physician (General Surgery) Gery Pray, MD as Consulting Physician (Radiation Oncology) Gardenia Phlegm, NP as Nurse Practitioner (Hematology and Oncology)  DIAGNOSIS:    ICD-10-CM   1. Malignant neoplasm of upper-outer quadrant of right breast in female, estrogen receptor positive ([place])  C50.411    Z17.0     SUMMARY OF ONCOLOGIC HISTORY: Oncology History  Malignant neoplasm of upper-outer quadrant of right breast in female, estrogen receptor positive ([place])  12/05/2016 Initial Diagnosis   Screening detected calcifications Rt breast spanning 1.5 cm, U/S measured a lesion 1.2 cm size, Biopsy revealed IDC Grade 3, ER/PR Negative, Her 2 Neg Ratio: 1.05; Ki 67: 20%; T1bN0 Stage 1A Clinical stage   12/24/2016 Genetic Testing   The patient had genetic testing due to a personal and family history of breast cancer.  The [facility] Common Hereditary Cancer Panel + Melanoma panel was sent out. The Hereditary Gene Panel + Melanoma Panel offered by [facility] includes sequencing and/or deletion duplication testing of the following 53 genes: APC, ATM, AXIN2, BAP1, BARD1, BMPR1A, BRCA1, BRCA2, BRIP1, CDH1, CDK4, CDKN2A (p14ARF), CDKN2A (p16INK4a), CHEK2, CTNNA1, DICER1, EPCAM (Deletion/duplication testing only), GREM1 (promoter region deletion/duplication testing only), KIT, MC1R, MEN1, MLH1, MSH2, MSH3, MSH6, MUTYH, NBN, NF1, NHTL1, PALB2, PDGFRA, PMS2, POLD1, POLE, POT1, PTEN, RAD50, RAD51C, RAD51D, RB1, SDHB, SDHC, SDHD, SMAD4, SMARCA4. STK11, TERT, TP53, TSC1, TSC2, and VHL.  The following genes were evaluated for sequence changes only: MITF, SDHA and HOXB13 c.251G>A variant only.  Results: No pathogenic mutations identified. 3 VU'sS in BRCA1 c.2967T>A (p.Phe989Leu) , MSH6 c.2857G>A (p.Glu953Lys), and TERT c.902G>A (p.Arg301His)  were identified.  The date of this test result is 12/24/2016.     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 Breast reconstruction 12/20/2017   09/2017 -  Anti-estrogen oral therapy   Tamoxifen daily     CHIEF COMPLIANT: Follow-up of right breast cancer on tamoxifen therapy  INTERVAL HISTORY: Lorraine Dean is a 54 y.o. 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 She presents to the clinic today for follow-up.    ALLERGIES:  has No Known Allergies.  MEDICATIONS:  Current Outpatient Medications  Medication Sig Dispense Refill  . aspirin EC 81 MG tablet Take 81 mg by mouth 3 (three) times a week.     Marland Kitchen atorvastatin (LIPITOR) 10 MG tablet Take 10 mg by mouth every evening.    . Biotin w/ Vitamins C & E (HAIR/SKIN/NAILS PO) Take 1 tablet by mouth every evening.    . Calcium Carb-Cholecalciferol (CALCIUM 600+D3 PO) Take 1 tablet by mouth every evening.    . Cholecalciferol (VITAMIN D-3) 5000 units TABS Take 5,000 Units by mouth every evening.    Marland Kitchen GARCINIA CAMBOGIA-CHROMIUM PO Take 1  tablet by mouth 2 (two) times a week. Saturday & Sunday    . Krill Oil 350 MG CAPS Take 350 mg by mouth daily.    . mometasone (NASONEX) 50 MCG/ACT nasal spray 2 SPRAYS IN EACH NOSTRIL ONCE A DAY IF NEEDED NASALLY  2  . Multiple Vitamins-Minerals (OCUVITE ADULT FORMULA) CAPS Take by mouth.    .  Multiple Vitamins-Minerals (OCUVITE PO) Take 1 tablet by mouth every evening.    . naproxen sodium (ALEVE) 220 MG tablet Take 220-440 mg by mouth 2 (two) times daily as needed (for pain.).    Marland Kitchen Probiotic Product (ALIGN PO) Take 1 capsule by mouth every evening.    . tamoxifen (NOLVADEX) 20 MG tablet Take 1 tablet (20 mg total) by mouth daily. 90 tablet 3   No current facility-administered medications for this visit.    PHYSICAL EXAMINATION: ECOG PERFORMANCE STATUS: 1 - Symptomatic but completely ambulatory  Vitals:   11/13/19 1208  BP: 128/65  Pulse: 80  Resp: 20  Temp: 98.7 F (37.1 C)  SpO2: 99%   Filed Weights   11/13/19 1208  Weight: 109 lb 6.4 oz (49.6 kg)    BREAST: No palpable masses or nodules in either right or left breasts. No palpable axillary supraclavicular or infraclavicular adenopathy no breast tenderness or nipple discharge. (exam performed in the presence of a chaperone)  LABORATORY DATA:  I have reviewed the data as listed CMP Latest Ref Rng & Units 08/01/2017 06/14/2017 06/07/2017  Glucose 65 - 99 mg/dL 101(H) 85 108  BUN 6 - 20 mg/dL '10 8 10  '$ Creatinine 0.44 - 1.00 mg/dL 0.65 0.64 0.73  Sodium 135 - 145 mmol/L 140 142 143  Potassium 3.5 - 5.1 mmol/L 4.2 3.7 3.5  Chloride 101 - 111 mmol/L 104 107 108  CO2 22 - 32 mmol/L '25 25 24  '$ Calcium 8.9 - 10.3 mg/dL 9.2 9.3 9.3  Total Protein 6.4 - 8.3 g/dL - 6.4 6.5  Total Bilirubin 0.2 - 1.2 mg/dL - 0.2 0.2  Alkaline Phos 40 - 150 U/L - 70 71  AST 5 - 34 U/L - 23 24  ALT 0 - 55 U/L - 25 29    Lab Results  Component Value Date   WBC 7.3 08/01/2017   HGB 14.0 08/01/2017   HCT 42.6 08/01/2017   MCV 89.5 08/01/2017   PLT 184 08/01/2017   NEUTROABS 2.3 06/14/2017    ASSESSMENT & PLAN:  Malignant neoplasm of upper-outer quadrant of right breast in female, estrogen receptor positive ([place]) 12/28/2016: Right lumpectomy: IDC grade 2, 1.2 cm, broadly 0.1 cm to the posterior margin, 0/5 lymph nodes negative, ER 0%, PR  0%, HER-2 negative ratio 1.05, Ki-67 5%, T1 cN0 stage IB AJCC 8  Recommendation: 1. Adjuvant chemotherapy with dose dense Adriamycin and Cytoxan every 2 weeks 4 followed by weekly Taxol 12 completed 06/14/2017 2. followed by adjuvant radiation therapy ---------------------------------------------------------------------- New diagnosis of DCIS estrogen receptor positive: 08/08/2017 right mastectomy: No evidence of residual breast cancer.  Current treatment:Tamoxifen 20 mg daily started 08/14/2017  Tamoxifen toxicities: Mild hot flashes Denies any arthralgias or myalgias  Breast cancer surveillance: 1.Breast exam 11/13/2019: No palpable lumps or nodules of concern 2.Mammogram 05/25/2019 at [facility]: Benign, breast density category D 3.  Breast MRI 11/03/2019: Benign postoperative changes, breast density category C   Return to clinic in 6 months for follow-up    No orders of the defined types were placed in this encounter.  The patient has a good understanding  of the overall plan. she agrees with it. she will call with any problems that may develop before the next visit here.  Total time spent: 20 mins including face to face time and time spent for planning, charting and coordination of care  Nicholas Lose, MD 11/13/2019  I, Cloyde Reams Dorshimer, am acting as scribe for Dr. Nicholas Lose.  I have reviewed the above documentation for accuracy and completeness, and I agree with the above.

## 2019-11-13 ENCOUNTER — Other Ambulatory Visit: Payer: Self-pay

## 2019-11-13 ENCOUNTER — Inpatient Hospital Stay: Payer: BC Managed Care – PPO | Attending: Hematology and Oncology | Admitting: Hematology and Oncology

## 2019-11-13 ENCOUNTER — Ambulatory Visit: Payer: BLUE CROSS/BLUE SHIELD | Admitting: Hematology and Oncology

## 2019-11-13 ENCOUNTER — Telehealth: Payer: Self-pay | Admitting: Hematology and Oncology

## 2019-11-13 DIAGNOSIS — Z7982 Long term (current) use of aspirin: Secondary | ICD-10-CM | POA: Insufficient documentation

## 2019-11-13 DIAGNOSIS — Z923 Personal history of irradiation: Secondary | ICD-10-CM | POA: Insufficient documentation

## 2019-11-13 DIAGNOSIS — Z170 Estrogen receptor positive status [ER+]: Secondary | ICD-10-CM | POA: Insufficient documentation

## 2019-11-13 DIAGNOSIS — Z9011 Acquired absence of right breast and nipple: Secondary | ICD-10-CM | POA: Diagnosis not present

## 2019-11-13 DIAGNOSIS — C50411 Malignant neoplasm of upper-outer quadrant of right female breast: Secondary | ICD-10-CM | POA: Insufficient documentation

## 2019-11-13 DIAGNOSIS — Z79810 Long term (current) use of selective estrogen receptor modulators (SERMs): Secondary | ICD-10-CM | POA: Diagnosis not present

## 2019-11-13 DIAGNOSIS — Z79899 Other long term (current) drug therapy: Secondary | ICD-10-CM | POA: Diagnosis not present

## 2019-11-13 DIAGNOSIS — Z9221 Personal history of antineoplastic chemotherapy: Secondary | ICD-10-CM | POA: Insufficient documentation

## 2019-11-13 MED ORDER — FOLIC ACID 1 MG PO TABS
1.0000 mg | ORAL_TABLET | Freq: Every day | ORAL | Status: AC
Start: 2019-11-13 — End: ?

## 2019-11-13 NOTE — Assessment & Plan Note (Signed)
12/28/2016: Right lumpectomy: IDC grade 2, 1.2 cm, broadly 0.1 cm to the posterior margin, 0/5 lymph nodes negative, ER 0%, PR 0%, HER-2 negative ratio 1.05, Ki-67 5%, T1 cN0 stage IB AJCC 8  Recommendation: 1. Adjuvant chemotherapy with dose dense Adriamycin and Cytoxan every 2 weeks 4 followed by weekly Taxol 12 completed 06/14/2017 2. followed by adjuvant radiation therapy ---------------------------------------------------------------------- New diagnosis of DCIS estrogen receptor positive: 08/08/2017 right mastectomy: No evidence of residual breast cancer.  Current treatment:Tamoxifen 20 mg daily started 08/14/2017  Tamoxifen toxicities: Mild hot flashes Denies any arthralgias or myalgias  Breast cancer surveillance: 1.Breast exam 11/13/2019: No palpable lumps or nodules of concern 2.Mammogram 05/25/2019 [facility]: Benign, breast density category D 3.  Breast MRI 11/03/2019: Benign postoperative changes, breast density category C   Return to clinic in 6 months for follow-up

## 2019-11-13 NOTE — Telephone Encounter (Signed)
Scheduled appts per 7/9 los. Pt declined print out of AVS and stated she would refer to mychart.

## 2019-12-12 ENCOUNTER — Other Ambulatory Visit: Payer: Self-pay | Admitting: Hematology and Oncology

## 2020-05-19 ENCOUNTER — Inpatient Hospital Stay: Payer: BC Managed Care – PPO | Admitting: Hematology and Oncology

## 2020-06-06 NOTE — Assessment & Plan Note (Signed)
12/28/2016: Right lumpectomy: IDC grade 2, 1.2 cm, broadly 0.1 cm to the posterior margin, 0/5 lymph nodes negative, ER 0%, PR 0%, HER-2 negative ratio 1.05, Ki-67 5%, T1 cN0 stage IB AJCC 8  Recommendation: 1. Adjuvant chemotherapy with dose dense Adriamycin and Cytoxan every 2 weeks 4 followed by weekly Taxol 12 completed 06/14/2017 2. followed by adjuvant radiation therapy ---------------------------------------------------------------------- New diagnosis of DCIS estrogen receptor positive: 08/08/2017 right mastectomy: No evidence of residual breast cancer.  Current treatment:Tamoxifen 20 mg daily started 08/14/2017  Tamoxifen toxicities: Mild hot flashes Denies any arthralgias or myalgias  Breast cancer surveillance: 1.Breast exam 06/06/2020: No palpable lumps or nodules of concern 2.Mammogram 6/29/21at [facility]: Benign, breast density category D 3.  Breast MRI 11/03/2019: Benign postoperative changes, breast density category C   Return to clinic in 6 months for follow-up

## 2020-06-06 NOTE — Progress Notes (Signed)
Patient Care Team: [name], MD as PCP - General (Family Medicine) Nicholas Lose, MD as Consulting Physician (Hematology and Oncology) [name], MD as Consulting Physician (General Surgery) Gery Pray, MD as Consulting Physician (Radiation Oncology) Gardenia Phlegm, NP as Nurse Practitioner (Hematology and Oncology)  DIAGNOSIS:    ICD-10-CM   1. Malignant neoplasm of upper-outer quadrant of right breast in female, estrogen receptor positive ([place])  C50.411 MR BREAST BILATERAL W [place] CAD   Z17.0     SUMMARY OF ONCOLOGIC HISTORY: Oncology History  Malignant neoplasm of upper-outer quadrant of right breast in female, estrogen receptor positive ([place])  12/05/2016 Initial Diagnosis   Screening detected calcifications Rt breast spanning 1.5 cm, U/S measured a lesion 1.2 cm size, Biopsy revealed IDC Grade 3, ER/PR Negative, Her 2 Neg Ratio: 1.05; Ki 67: 20%; T1bN0 Stage 1A Clinical stage   12/24/2016 Genetic Testing   The patient had genetic testing due to a personal and family history of breast cancer.  The [facility] Common Hereditary Cancer Panel + Melanoma panel was sent out. The Hereditary Gene Panel + Melanoma Panel offered by [facility] includes sequencing and/or deletion duplication testing of the following 53 genes: APC, ATM, AXIN2, BAP1, BARD1, BMPR1A, BRCA1, BRCA2, BRIP1, CDH1, CDK4, CDKN2A (p14ARF), CDKN2A (p16INK4a), CHEK2, CTNNA1, DICER1, EPCAM (Deletion/duplication testing only), GREM1 (promoter region deletion/duplication testing only), KIT, MC1R, MEN1, MLH1, MSH2, MSH3, MSH6, MUTYH, NBN, NF1, NHTL1, PALB2, PDGFRA, PMS2, POLD1, POLE, POT1, PTEN, RAD50, RAD51C, RAD51D, RB1, SDHB, SDHC, SDHD, SMAD4, SMARCA4. STK11, TERT, TP53, TSC1, TSC2, and VHL.  The following genes were evaluated for sequence changes only: MITF, SDHA and HOXB13 c.251G>A variant only.  Results: No pathogenic mutations identified. 3 VU'sS in BRCA1 c.2967T>A (p.Phe989Leu) , MSH6 c.2857G>A  (p.Glu953Lys), and TERT c.902G>A (p.Arg301His) were identified.  The date of this test result is 12/24/2016.     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 Breast reconstruction 12/20/2017   09/2017 -  Anti-estrogen oral therapy   Tamoxifen daily     CHIEF COMPLIANT: Follow-up of right breast cancer on tamoxifen therapy  INTERVAL HISTORY: Lorraine Dean is a 55 y.o. 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   ALLERGIES:  has No Known Allergies.  MEDICATIONS:  Current Outpatient Medications  Medication Sig Dispense Refill  . aspirin EC 81 MG tablet Take 81 mg by mouth 3 (three) times a week.     Marland Kitchen atorvastatin (LIPITOR) 10 MG tablet Take 10 mg by mouth every evening.    . Biotin w/ Vitamins C & E (HAIR/SKIN/NAILS PO) Take 1 tablet by mouth every evening.    . folic acid (FOLVITE) 1 MG tablet Take 1 tablet (1 mg total) by mouth daily.    [name] Oil 350 MG CAPS Take 350 mg by mouth daily.    Marland Kitchen  mometasone (NASONEX) 50 MCG/ACT nasal spray 2 SPRAYS IN EACH NOSTRIL ONCE A DAY IF NEEDED NASALLY  2  . Multiple Vitamins-Minerals (OCUVITE ADULT FORMULA) CAPS Take by mouth.    . Multiple Vitamins-Minerals (OCUVITE PO) Take 1 tablet by mouth every evening.    . naproxen sodium (ALEVE) 220 MG tablet Take 220-440 mg  by mouth 2 (two) times daily as needed (for pain.).    Marland Kitchen Probiotic Product (ALIGN PO) Take 1 capsule by mouth every evening.    . tamoxifen (NOLVADEX) 20 MG tablet TAKE 1 TABLET BY MOUTH EVERY DAY 30 tablet 11   No current facility-administered medications for this visit.    PHYSICAL EXAMINATION: ECOG PERFORMANCE STATUS: 1 - Symptomatic but completely ambulatory  Vitals:   06/07/20 0947  BP: 132/67  Pulse: 83  Resp: 16  Temp: (!) 97.5 F (36.4 C)  SpO2: 97%   Filed Weights   06/07/20 0947  Weight: 117 lb 6.4 oz (53.3 kg)    BREAST: No palpable masses or nodules in either right or left breasts. No palpable axillary supraclavicular or infraclavicular adenopathy no breast tenderness or nipple discharge. (exam performed in the presence of a chaperone)  LABORATORY DATA:  I have reviewed the data as listed CMP Latest Ref Rng & Units 08/01/2017 06/14/2017 06/07/2017  Glucose 65 - 99 mg/dL 101(H) 85 108  BUN 6 - 20 mg/dL $Remove'10 8 10  'YNkgZRB$ Creatinine 0.44 - 1.00 mg/dL 0.65 0.64 0.73  Sodium 135 - 145 mmol/L 140 142 143  Potassium 3.5 - 5.1 mmol/L 4.2 3.7 3.5  Chloride 101 - 111 mmol/L 104 107 108  CO2 22 - 32 mmol/L $RemoveB'25 25 24  'VTJTQwdT$ Calcium 8.9 - 10.3 mg/dL 9.2 9.3 9.3  Total Protein 6.4 - 8.3 g/dL - 6.4 6.5  Total Bilirubin 0.2 - 1.2 mg/dL - 0.2 0.2  Alkaline Phos 40 - 150 U/L - 70 71  AST 5 - 34 U/L - 23 24  ALT 0 - 55 U/L - 25 29    Lab Results  Component Value Date   WBC 7.3 08/01/2017   HGB 14.0 08/01/2017   HCT 42.6 08/01/2017   MCV 89.5 08/01/2017   PLT 184 08/01/2017   NEUTROABS 2.3 06/14/2017    ASSESSMENT & PLAN:  Malignant neoplasm of upper-outer quadrant of right breast in female, estrogen receptor positive ([place]) 12/28/2016: Right lumpectomy: IDC grade 2, 1.2 cm, broadly 0.1 cm to the posterior margin, 0/5 lymph nodes negative, ER 0%, PR 0%, HER-2 negative ratio 1.05, Ki-67 5%, T1 cN0 stage IB AJCC 8  Recommendation: 1. Adjuvant chemotherapy with dose dense Adriamycin and  Cytoxan every 2 weeks 4 followed by weekly Taxol 12 completed 06/14/2017 2. followed by adjuvant radiation therapy ---------------------------------------------------------------------- New diagnosis of DCIS estrogen receptor positive: 08/08/2017 right mastectomy: No evidence of residual breast cancer.  Current treatment:Tamoxifen 20 mg daily started 08/14/2017  Tamoxifen toxicities: Mild hot flashes Denies any arthralgias or myalgias  Breast cancer surveillance: 1.Breast exam 06/06/2020: No palpable lumps or nodules of concern 2.Mammogram 6/29/21at [facility]: Benign, breast density category D 3.  Breast MRI 11/03/2019: Benign postoperative changes, breast density category C Breast MRI will be ordered for 6 months from now.  After that we can do breast MRIs every other year.  Return to clinic in 6 months for follow-up    Orders Placed This Encounter  Procedures  . MR BREAST BILATERAL W [place] CAD    Standing Status:   Future    Standing Expiration Date:  06/07/2021    Order Specific Question:   If indicated for the ordered procedure, I authorize the administration of contrast media per Radiology protocol    Answer:   Yes    Order Specific Question:   What is the patient's sedation requirement?    Answer:   No Sedation    Order Specific Question:   Does the patient have a pacemaker or implanted devices?    Answer:   No    Order Specific Question:   Preferred imaging location?    Answer:   [facility] (table limit-550lbs)    Order Specific Question:   Release to patient    Answer:   Immediate   The patient has a good understanding of the overall plan. she agrees with it. she will call with any problems that may develop before the next visit here.  Total time spent: 20 mins including face to face time and time spent for planning, charting and coordination of care  Nicholas Lose, MD 06/07/2020  I, Cloyde Reams Dorshimer, am acting as scribe for Dr. Nicholas Lose.  I  have reviewed the above documentation for accuracy and completeness, and I agree with the above.

## 2020-06-07 ENCOUNTER — Other Ambulatory Visit: Payer: Self-pay

## 2020-06-07 ENCOUNTER — Telehealth: Payer: Self-pay | Admitting: Hematology and Oncology

## 2020-06-07 ENCOUNTER — Inpatient Hospital Stay: Payer: BC Managed Care – PPO | Attending: Hematology and Oncology | Admitting: Hematology and Oncology

## 2020-06-07 DIAGNOSIS — Z9221 Personal history of antineoplastic chemotherapy: Secondary | ICD-10-CM | POA: Diagnosis not present

## 2020-06-07 DIAGNOSIS — Z79899 Other long term (current) drug therapy: Secondary | ICD-10-CM | POA: Diagnosis not present

## 2020-06-07 DIAGNOSIS — C50411 Malignant neoplasm of upper-outer quadrant of right female breast: Secondary | ICD-10-CM | POA: Insufficient documentation

## 2020-06-07 DIAGNOSIS — Z7982 Long term (current) use of aspirin: Secondary | ICD-10-CM | POA: Insufficient documentation

## 2020-06-07 DIAGNOSIS — Z170 Estrogen receptor positive status [ER+]: Secondary | ICD-10-CM | POA: Diagnosis present

## 2020-06-07 DIAGNOSIS — Z79810 Long term (current) use of selective estrogen receptor modulators (SERMs): Secondary | ICD-10-CM | POA: Insufficient documentation

## 2020-06-07 DIAGNOSIS — Z9011 Acquired absence of right breast and nipple: Secondary | ICD-10-CM | POA: Insufficient documentation

## 2020-06-07 DIAGNOSIS — Z923 Personal history of irradiation: Secondary | ICD-10-CM | POA: Insufficient documentation

## 2020-06-07 NOTE — Telephone Encounter (Signed)
Scheduled appointment per 2/1 los. Spoke to patient who is aware of appointment date and time. Gave patient calendar print out.  

## 2020-06-09 ENCOUNTER — Ambulatory Visit: Payer: BC Managed Care – PPO | Admitting: Hematology and Oncology

## 2020-06-22 ENCOUNTER — Encounter: Payer: Self-pay | Admitting: Hematology and Oncology

## 2020-07-04 ENCOUNTER — Encounter: Payer: Self-pay | Admitting: Genetic Counselor

## 2020-07-04 NOTE — Progress Notes (Signed)
UPDATE:  The MSH6 c.2857G>A VUS was reclassified to "Likely Benign" on 06/28/2020. The change in variant classification was made as a result of re-review of the evidence in light of new variant interpretation guidelines and/or new information.

## 2020-09-30 ENCOUNTER — Other Ambulatory Visit: Payer: Self-pay

## 2020-09-30 MED ORDER — TAMOXIFEN CITRATE 20 MG PO TABS: 20.0000 mg | ORAL_TABLET | Freq: Every day | ORAL | 0 refills | Status: DC

## 2020-12-05 ENCOUNTER — Telehealth: Payer: Self-pay | Admitting: *Deleted

## 2020-12-05 ENCOUNTER — Other Ambulatory Visit: Payer: BC Managed Care – PPO

## 2020-12-05 NOTE — Telephone Encounter (Signed)
Notified of message below. She will call us on Monday, 8/8 to check on status

## 2020-12-05 NOTE — Telephone Encounter (Signed)
-----   Message from Gardenia Phlegm, NP sent at 12/02/2020  4:09 PM EDT ----- Regarding: RE: MRI Denial Please let the patient know that the soonest the insurance company was available to speak to [place] was august 4.   ----- Message ----- From: Rennis Harding, RN Sent: 12/02/2020   3:11 PM EDT To: Merril Abbe, LPN, Jesse Fall, RN, # Subject: MRI Denial                                     Hello All -   Pt called due to MRI scheduled for 8/1 being cancelled.   MRI is pending P2P from chart review.   Mendel Ryder, would you be able to help with P2P?   Thanks,  Kathlee Nations

## 2020-12-07 ENCOUNTER — Ambulatory Visit: Payer: BC Managed Care – PPO | Admitting: Hematology and Oncology

## 2020-12-08 ENCOUNTER — Telehealth: Payer: Self-pay | Admitting: Adult Health

## 2020-12-08 NOTE — Telephone Encounter (Signed)
Had missed call from Dr. Ellin Mayhew about patient Lorraine Dean breast MRI authorization.  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  He thanked me for the call and noted he would re-review the info and would let our office know in 24 hours what to do moving forward.     Wilber Bihari, NP

## 2020-12-13 ENCOUNTER — Telehealth: Payer: Self-pay | Admitting: *Deleted

## 2020-12-13 NOTE — Telephone Encounter (Signed)
Received VM from [facility] stating the prior "non cert was upheld" for MRI.

## 2020-12-15 ENCOUNTER — Telehealth: Payer: Self-pay | Admitting: Hematology and Oncology

## 2020-12-15 ENCOUNTER — Other Ambulatory Visit: Payer: Self-pay | Admitting: *Deleted

## 2020-12-15 ENCOUNTER — Telehealth: Payer: Self-pay | Admitting: *Deleted

## 2020-12-15 DIAGNOSIS — C50411 Malignant neoplasm of upper-outer quadrant of right female breast: Secondary | ICD-10-CM

## 2020-12-15 DIAGNOSIS — Z170 Estrogen receptor positive status [ER+]: Secondary | ICD-10-CM

## 2020-12-15 NOTE — Progress Notes (Signed)
No notes

## 2020-12-15 NOTE — Telephone Encounter (Signed)
Notified that breast MRI was denied. Mammogram ordered for February 2023 per Dr Lindi Adie. Will reschedule follow up with Dr Lindi Adie. Message to scheduler

## 2020-12-15 NOTE — Telephone Encounter (Signed)
Scheduled appt per 8/11 sch msg. Pt aware.

## 2020-12-18 NOTE — Assessment & Plan Note (Signed)
12/28/2016: Right lumpectomy: IDC grade 2, 1.2 cm, broadly 0.1 cm to the posterior margin, 0/5 lymph nodes negative, ER 0%, PR 0%, HER-2 negative ratio 1.05, Ki-67 5%, T1 cN0 stage IB AJCC 8  Recommendation: 1. Adjuvant chemotherapy with dose dense Adriamycin and Cytoxan every 2 weeks 4 followed by weekly Taxol 12 completed 06/14/2017 2. followed by adjuvant radiation therapy ---------------------------------------------------------------------- New diagnosis of DCIS estrogen receptor positive: 08/08/2017 right mastectomy: No evidence of residual breast cancer.  Current treatment:Tamoxifen 20 mg daily started 08/14/2017  Tamoxifen toxicities: Mild hot flashes Denies any arthralgias or myalgias  Breast cancer surveillance: 1.Breast exam1/31/2022: No palpable lumps or nodules of concern 2.Mammogram6/29/21at [facility]: Benign, breast density category D 3.Breast MRI 11/03/2019: Benign postoperative changes, breast density category C Breast MRI will be ordered for 6 months from now.  After that we can do breast MRIs every other year.  Return to clinic in 6 months for follow-up

## 2020-12-18 NOTE — Progress Notes (Signed)
Patient Care Team: [name], MD as PCP - General (Family Medicine) Nicholas Lose, MD as Consulting Physician (Hematology and Oncology) [name], MD as Consulting Physician (General Surgery) Gery Pray, MD as Consulting Physician (Radiation Oncology) Gardenia Phlegm, NP as Nurse Practitioner (Hematology and Oncology)  DIAGNOSIS:    ICD-10-CM   1. Malignant neoplasm of upper-outer quadrant of right breast in female, estrogen receptor positive ([place])  C50.411    Z17.0       SUMMARY OF ONCOLOGIC HISTORY: Oncology History  Malignant neoplasm of upper-outer quadrant of right breast in female, estrogen receptor positive ([place])  12/05/2016 Initial Diagnosis   Screening detected calcifications Rt breast spanning 1.5 cm, U/S measured a lesion 1.2 cm size, Biopsy revealed IDC Grade 3, ER/PR Negative, Her 2 Neg Ratio: 1.05; Ki 67: 20%; T1bN0 Stage 1A Clinical stage   12/24/2016 Genetic Testing   The patient had genetic testing due to a personal and family history of breast cancer.  The [facility] Common Hereditary Cancer Panel + Melanoma panel was sent out. The Hereditary Gene Panel + Melanoma Panel offered by [facility] includes sequencing and/or deletion duplication testing of the following 53 genes: APC, ATM, AXIN2, BAP1, BARD1, BMPR1A, BRCA1, BRCA2, BRIP1, CDH1, CDK4, CDKN2A (p14ARF), CDKN2A (p16INK4a), CHEK2, CTNNA1, DICER1, EPCAM (Deletion/duplication testing only), GREM1 (promoter region deletion/duplication testing only), KIT, MC1R, MEN1, MLH1, MSH2, MSH3, MSH6, MUTYH, NBN, NF1, NHTL1, PALB2, PDGFRA, PMS2, POLD1, POLE, POT1, PTEN, RAD50, RAD51C, RAD51D, RB1, SDHB, SDHC, SDHD, SMAD4, SMARCA4. STK11, TERT, TP53, TSC1, TSC2, and VHL.  The following genes were evaluated for sequence changes only: MITF, SDHA and HOXB13 c.251G>A variant only.  Results: No pathogenic mutations identified. 3 VU'sS in BRCA1 c.2967T>A (p.Phe989Leu) , MSH6 c.2857G>A (p.Glu953Lys), and TERT c.902G>A  (p.Arg301His) were identified.  The date of this test result is 12/24/2016.    UPDATE:  The MSH6 c.2857G>A VUS was reclassified to "Likely Benign" on 06/28/2020. The change in variant classification was made as a result of re-review of the evidence in light of new variant interpretation guidelines and/or new information.   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 Breast reconstruction 12/20/2017   09/2017 -  Anti-estrogen oral therapy   Tamoxifen daily     CHIEF COMPLIANT: Follow-up of right breast cancer on tamoxifen therapy  INTERVAL HISTORY: Lorraine Dean is a 55 y.o. 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 She is currently on anti-estrogen therapy with tamoxifen. She presents to the clinic today for follow-up.  She tells me that her gynecologist found multiple polyps in the uterus and is very concerned about the risk of uterine cancer.  She has had spotting as result of that.  However she has not had a menstrual cycle since chemotherapy was completed.  ALLERGIES:  has No Known Allergies.  MEDICATIONS:  Current Outpatient Medications  Medication Sig Dispense Refill   aspirin EC 81 MG tablet  Take 81 mg by mouth 3 (three) times a week.      atorvastatin (LIPITOR) 10 MG tablet Take 10 mg by mouth every evening.     Biotin w/ Vitamins C & E (HAIR/SKIN/NAILS PO) Take 1 tablet by mouth every evening.     folic  acid (FOLVITE) 1 MG tablet Take 1 tablet (1 mg total) by mouth daily.     Krill Oil 350 MG CAPS Take 350 mg by mouth daily.     mometasone (NASONEX) 50 MCG/ACT nasal spray 2 SPRAYS IN EACH NOSTRIL ONCE A DAY IF NEEDED NASALLY  2   Multiple Vitamins-Minerals (OCUVITE ADULT FORMULA) CAPS Take by mouth.     Multiple Vitamins-Minerals (OCUVITE PO) Take 1 tablet by mouth every evening.     naproxen sodium (ALEVE) 220 MG tablet Take 220-440 mg by mouth 2 (two) times daily as needed (for pain.).     Probiotic Product (ALIGN PO) Take 1 capsule by mouth every evening.     tamoxifen (NOLVADEX) 20 MG tablet Take 1 tablet (20 mg total) by mouth daily. 90 tablet 0   No current facility-administered medications for this visit.    PHYSICAL EXAMINATION: ECOG PERFORMANCE STATUS: 1 - Symptomatic but completely ambulatory  Vitals:   12/19/20 1118  BP: 124/64  Pulse: 91  Resp: 19  Temp: (!) 97.3 F (36.3 C)  SpO2: 98%   Filed Weights   12/19/20 1118  Weight: 117 lb 6.4 oz (53.3 kg)    BREAST: No palpable masses or nodules in either right or left breasts. No palpable axillary supraclavicular or infraclavicular adenopathy no breast tenderness or nipple discharge. (exam performed in the presence of a chaperone)  LABORATORY DATA:  I have reviewed the data as listed CMP Latest Ref Rng & Units 08/01/2017 06/14/2017 06/07/2017  Glucose 65 - 99 mg/dL 101(H) 85 108  BUN 6 - 20 mg/dL _0 Creatinine 0.44 - 1.00 mg/dL 0.65 0.64 0.73  Sodium 135 - 145 mmol/L 140 142 143  Potassium 3.5 - 5.1 mmol/L 4.2 3.7 3.5  Chloride 101 - 111 mmol/L 104 107 108  CO2 22 - 32 mmol/L _1 Calcium 8.9 - 10.3 mg/dL 9.2 9.3 9.3  Total Protein 6.4 - 8.3 g/dL - 6.4 6.5  Total Bilirubin 0.2 - 1.2 mg/dL - 0.2 0.2  Alkaline Phos 40 - 150 U/L - 70 71  AST 5 - 34 U/L - 23 24  ALT 0 - 55 U/L - 25 29    Lab Results  Component Value Date   WBC 7.3 08/01/2017   HGB 14.0 08/01/2017   HCT 42.6 08/01/2017   MCV 89.5  08/01/2017   PLT 184 08/01/2017   NEUTROABS 2.3 06/14/2017    ASSESSMENT & PLAN:  Malignant neoplasm of upper-outer quadrant of right breast in female, estrogen receptor positive ([place]) 12/28/2016: Right lumpectomy: IDC grade 2, 1.2 cm, broadly 0.1 cm to the posterior margin, 0/5 lymph nodes negative, ER 0%, PR 0%, HER-2 negative ratio 1.05, Ki-67 5%, T1 cN0 stage IB AJCC 8   Recommendation: 1. Adjuvant chemotherapy with dose dense Adriamycin and Cytoxan every 2 weeks 4 followed by weekly Taxol 12 completed 06/14/2017 2. followed by adjuvant radiation therapy ---------------------------------------------------------------------- New diagnosis of DCIS estrogen receptor positive:  08/08/2017 right mastectomy: No evidence of residual breast cancer.   Current treatment: Tamoxifen 20 mg daily started 08/14/2017 discontinued 12/19/2020 and switched to letrozole (because of uterine polyps and spotting) plan is to treat for 5  years total.  (This is primarily for the DCIS that was diagnosed in 2019, her original cancer was triple negative.)   Tamoxifen toxicities: Mild hot flashes Denies any arthralgias or myalgias   Breast cancer surveillance: 1.  Breast exam 06/06/2020: No palpable lumps or nodules of concern 2.  Mammogram 05/30/2020 at [facility]: Benign, breast density category C 3.  Breast MRI 11/03/2019: Benign postoperative changes, breast density category C Breast MRI was not approved by her insurance and therefore they were not able Bone density will be ordered.  Return to clinic in 1 year for follow-up or sooner if she has any problems tolerating letrozole.      No orders of the defined types were placed in this encounter.  The patient has a good understanding of the overall plan. she agrees with it. she will call with any problems that may develop before the next visit here.  Total time spent: 20 mins including face to face time and time spent for planning, charting and coordination of  care  Rulon Eisenmenger, MD, MPH 12/19/2020  I, Thana Ates, am acting as scribe for Dr. Nicholas Lose.  I have reviewed the above documentation for accuracy and completeness, and I agree with the above.

## 2020-12-19 ENCOUNTER — Other Ambulatory Visit: Payer: Self-pay

## 2020-12-19 ENCOUNTER — Inpatient Hospital Stay: Payer: BC Managed Care – PPO | Attending: Hematology and Oncology | Admitting: Hematology and Oncology

## 2020-12-19 VITALS — BP 124/64 | HR 91 | Temp 97.3°F | Resp 19 | Ht 61.0 in | Wt 117.4 lb

## 2020-12-19 DIAGNOSIS — Z170 Estrogen receptor positive status [ER+]: Secondary | ICD-10-CM | POA: Diagnosis not present

## 2020-12-19 DIAGNOSIS — Z9011 Acquired absence of right breast and nipple: Secondary | ICD-10-CM | POA: Insufficient documentation

## 2020-12-19 DIAGNOSIS — Z780 Asymptomatic menopausal state: Secondary | ICD-10-CM | POA: Diagnosis not present

## 2020-12-19 DIAGNOSIS — Z79899 Other long term (current) drug therapy: Secondary | ICD-10-CM | POA: Insufficient documentation

## 2020-12-19 DIAGNOSIS — Z79810 Long term (current) use of selective estrogen receptor modulators (SERMs): Secondary | ICD-10-CM | POA: Insufficient documentation

## 2020-12-19 DIAGNOSIS — C50411 Malignant neoplasm of upper-outer quadrant of right female breast: Secondary | ICD-10-CM

## 2020-12-19 MED ORDER — LETROZOLE 2.5 MG PO TABS
2.5000 mg | ORAL_TABLET | Freq: Every day | ORAL | 3 refills | Status: DC
Start: 2020-12-19 — End: 2021-11-21

## 2021-06-05 ENCOUNTER — Encounter: Payer: Self-pay | Admitting: Hematology and Oncology

## 2021-06-12 ENCOUNTER — Telehealth: Payer: Self-pay | Admitting: Hematology and Oncology

## 2021-06-12 NOTE — Telephone Encounter (Signed)
Sch per 2/6 inbasket, pt aware

## 2021-06-15 ENCOUNTER — Inpatient Hospital Stay: Payer: BC Managed Care – PPO | Attending: Adult Health | Admitting: Adult Health

## 2021-06-15 ENCOUNTER — Encounter: Payer: Self-pay | Admitting: Adult Health

## 2021-06-15 ENCOUNTER — Inpatient Hospital Stay: Payer: BC Managed Care – PPO

## 2021-06-15 ENCOUNTER — Other Ambulatory Visit: Payer: Self-pay

## 2021-06-15 VITALS — BP 129/64 | HR 87 | Temp 97.5°F | Resp 16 | Ht 61.0 in | Wt 120.6 lb

## 2021-06-15 DIAGNOSIS — E559 Vitamin D deficiency, unspecified: Secondary | ICD-10-CM | POA: Diagnosis not present

## 2021-06-15 DIAGNOSIS — Z79811 Long term (current) use of aromatase inhibitors: Secondary | ICD-10-CM | POA: Diagnosis not present

## 2021-06-15 DIAGNOSIS — Z170 Estrogen receptor positive status [ER+]: Secondary | ICD-10-CM | POA: Insufficient documentation

## 2021-06-15 DIAGNOSIS — C50411 Malignant neoplasm of upper-outer quadrant of right female breast: Secondary | ICD-10-CM | POA: Insufficient documentation

## 2021-06-15 DIAGNOSIS — M816 Localized osteoporosis [Lequesne]: Secondary | ICD-10-CM

## 2021-06-15 LAB — CMP (CANCER CENTER ONLY)
ALT: 25 U/L (ref 0–44)
AST: 23 U/L (ref 15–41)
Albumin: 4.6 g/dL (ref 3.5–5.0)
Alkaline Phosphatase: 70 U/L (ref 38–126)
Anion gap: 6 (ref 5–15)
BUN: 11 mg/dL (ref 6–20)
CO2: 29 mmol/L (ref 22–32)
Calcium: 9.5 mg/dL (ref 8.9–10.3)
Chloride: 104 mmol/L (ref 98–111)
Creatinine: 0.63 mg/dL (ref 0.44–1.00)
GFR, Estimated: >60 mL/min (ref >60)
Glucose, Bld: 91 mg/dL (ref 70–99)
Potassium: 3.7 mmol/L (ref 3.5–5.1)
Sodium: 139 mmol/L (ref 135–145)
Total Bilirubin: 0.5 mg/dL (ref 0.3–1.2)
Total Protein: 7.7 g/dL (ref 6.5–8.1)

## 2021-06-15 LAB — CBC WITH DIFFERENTIAL (CANCER CENTER ONLY)
Abs Immature Granulocytes: 0.01 10*3/uL (ref 0.00–0.07)
Basophils Absolute: 0 10*3/uL (ref 0.0–0.1)
Basophils Relative: 1 %
Eosinophils Absolute: 0.2 10*3/uL (ref 0.0–0.5)
Eosinophils Relative: 2 %
HCT: 43.2 % (ref 36.0–46.0)
Hemoglobin: 14.6 g/dL (ref 12.0–15.0)
Immature Granulocytes: 0 %
Lymphocytes Relative: 36 %
Lymphs Abs: 2.6 10*3/uL (ref 0.7–4.0)
MCH: 29.6 pg (ref 26.0–34.0)
MCHC: 33.8 g/dL (ref 30.0–36.0)
MCV: 87.6 fL (ref 80.0–100.0)
Monocytes Absolute: 0.6 10*3/uL (ref 0.1–1.0)
Monocytes Relative: 8 %
Neutro Abs: 3.9 10*3/uL (ref 1.7–7.7)
Neutrophils Relative %: 53 %
Platelet Count: 213 10*3/uL (ref 150–400)
RBC: 4.93 MIL/uL (ref 3.87–5.11)
RDW: 12.9 % (ref 11.5–15.5)
WBC Count: 7.2 10*3/uL (ref 4.0–10.5)
nRBC: 0 % (ref 0.0–0.2)

## 2021-06-15 LAB — VITAMIN D 25 HYDROXY (VIT D DEFICIENCY, FRACTURES): Vit D, 25-Hydroxy: 29.49 ng/mL — ABNORMAL LOW (ref 30–100)

## 2021-06-15 MED ORDER — ALENDRONATE SODIUM 70 MG PO TABS: 70.0000 mg | ORAL_TABLET | ORAL | 3 refills | Status: DC

## 2021-06-15 NOTE — Progress Notes (Signed)
[facility] Cancer Follow up:    Farris Has, MD [address] [place] [address]   DIAGNOSIS:  Cancer Staging  Malignant neoplasm of upper-outer quadrant of right breast in female, estrogen receptor positive (HCC) Staging form: Breast, AJCC 8th Edition - Clinical stage from 12/12/2016: Stage IB (cT1c, cN0, cM0, G3, ER-, PR-, HER2-) - Unsigned Histologic grading system: 3 grade system Laterality: Right Staged by: Pathologist and managing physician Stage used in treatment planning: Yes National guidelines used in treatment planning: Yes Type of national guideline used in treatment planning: NCCN - Pathologic: Stage IB (pT1c, pN0, cM0, G2, ER-, PR-, HER2-) - Unsigned Histologic grading system: 3 grade system - Pathologic: Stage 0 (pTis (DCIS), pN0, cM0) - Unsigned   SUMMARY OF ONCOLOGIC HISTORY: Oncology History  Malignant neoplasm of upper-outer quadrant of right breast in female, estrogen receptor positive (HCC)  12/05/2016 Initial Diagnosis   Screening detected calcifications Rt breast spanning 1.5 cm, U/S measured a lesion 1.2 cm size, Biopsy revealed IDC Grade 3, ER/PR Negative, Her 2 Neg Ratio: 1.05; Ki 67: 20%; T1bN0 Stage 1A Clinical stage   12/24/2016 Genetic Testing   The patient had genetic testing due to a personal and family history of breast cancer.  The [facility] Common Hereditary Cancer Panel + Melanoma panel was sent out. The Hereditary Gene Panel + Melanoma Panel offered by [facility] includes sequencing and/or deletion duplication testing of the following 53 genes: APC, ATM, AXIN2, BAP1, BARD1, BMPR1A, BRCA1, BRCA2, BRIP1, CDH1, CDK4, CDKN2A (p14ARF), CDKN2A (p16INK4a), CHEK2, CTNNA1, DICER1, EPCAM (Deletion/duplication testing only), GREM1 (promoter region deletion/duplication testing only), KIT, MC1R, MEN1, MLH1, MSH2, MSH3, MSH6, MUTYH, NBN, NF1, NHTL1, PALB2, PDGFRA, PMS2, POLD1, POLE, POT1, PTEN, RAD50, RAD51C, RAD51D, RB1, SDHB, SDHC,  SDHD, SMAD4, SMARCA4. STK11, TERT, TP53, TSC1, TSC2, and VHL.  The following genes were evaluated for sequence changes only: MITF, SDHA and HOXB13 c.251G>A variant only.  Results: No pathogenic mutations identified. 3 VU'sS in BRCA1 c.2967T>A (p.Phe989Leu) , MSH6 c.2857G>A (p.Glu953Lys), and TERT c.902G>A (p.Arg301His) were identified.  The date of this test result is 12/24/2016.    UPDATE:  The MSH6 c.2857G>A VUS was reclassified to "Likely Benign" on 06/28/2020. The change in variant classification was made as a result of re-review of the evidence in light of new variant interpretation guidelines and/or new information.   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 Breast reconstruction 12/20/2017   09/2017 -  Anti-estrogen oral therapy   Tamoxifen daily started 08/14/2017 discontinued 12/19/2020 and switched to letrozole (because of uterine polyps and spotting) plan is to treat for 5 years total.  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 female returns for evaluation of her h/o bresat cancer.  She continues to undergo left breast mammograms at [facility] and we do not currently have those results.  She undergoes MRI every other June.  She is due 10/2021.  She underwent bone density testing on 06/09/2021 and it showed osteoporosis with a T score of -3.4 in the spine.  She is taking letrozole which she started in August 2022 due to  uterine polyps and spotting.     Patient Active Problem List   Diagnosis Date Noted   Breast cancer of upper-outer quadrant of right female breast ([place]) 08/08/2017   Port catheter in place 01/25/2017   Preoperative clearance 12/24/2016   Palpitations 12/24/2016   Hyperlipidemia 12/24/2016   Family history of early CAD 12/24/2016   Genetic testing 12/20/2016   Family history of breast cancer 12/13/2016   Family history of lung cancer 12/13/2016   Malignant neoplasm of upper-outer quadrant of right breast in female, estrogen receptor positive ([place]) 12/11/2016    has No Known Allergies.  MEDICAL HISTORY: Past Medical History:  Diagnosis Date   Anxiety    Cancer [facility])    right breast cancer   Chronic rhinitis    Depression    Family history of adverse reaction to anesthesia    Family history of breast cancer 12/13/2016   Family history of lung cancer 12/13/2016   Heart murmur 12/27/2016   ECHO normal   High cholesterol    HPV in female    Malignant neoplasm of upper-outer quadrant of right breast in female, estrogen receptor positive ([place]) 12/11/2016   PONV (postoperative nausea and vomiting)    needs scop patch   Vertigo    last flare 2016    SURGICAL HISTORY: Past Surgical History:  Procedure Laterality Date   BREAST BIOPSY Right 2018   triple negative IDC grade 3 tumor   BREAST ENHANCEMENT SURGERY Left 12/20/2017   Procedure: LEFT AUGMENTATION FOR SYMMETRY;  Surgeon: Irene Limbo, MD;  Location: [place];  Service: Plastics;  Laterality: Left;   BREAST LUMPECTOMY WITH RADIOACTIVE SEED AND SENTINEL LYMPH NODE BIOPSY Right 12/28/2016   Procedure: RIGHT BREAST LUMPECTOMY WITH RADIOACTIVE SEED AND SENTINEL LYMPH NODE MAPPING ERAS PATHWAY;  Surgeon: [name], MD;  Location: [place];  Service: General;  Laterality: Right;  PEC BLOCK   BREAST RECONSTRUCTION WITH PLACEMENT OF TISSUE EXPANDER AND ALLODERM Right 08/08/2017   Procedure: RIGHT  BREAST RECONSTRUCTION WITH PLACEMENT OF TISSUE EXPANDER AND ALLODERM;  Surgeon: Irene Limbo, MD;  Location: [place];  Service: Plastics;  Laterality: Right;   LIPOSUCTION WITH LIPOFILLING Right 12/20/2017   Procedure: LIPOFILLING FROM ABDOMEN TO RIGHT CHEST;  Surgeon: Irene Limbo, MD;  Location: [place];  Service: Plastics;  Laterality: Right;   NIPPLE SPARING MASTECTOMY Right 08/08/2017   Procedure: RIGHT NIPPLE SPARING MASTECTOMY ERAS PATHWAY;  Surgeon: [name], MD;  Location: [place];  Service: General;  Laterality: Right;   PORT-A-CATH REMOVAL N/A 08/08/2017   Procedure: REMOVAL PORT-A-CATH;  Surgeon: [name], MD;  Location: [place];  Service: General;  Laterality: N/A;   PORTACATH PLACEMENT Left 01/18/2017   Procedure: INSERTION PORT-A-CATH;  Surgeon: [name], MD;  Location: [place];  Service: General;  Laterality: Left;   REMOVAL OF TISSUE EXPANDER AND PLACEMENT OF IMPLANT Right 12/20/2017   Procedure: REMOVAL OF TISSUE EXPANDER AND PLACEMENT OF IMPLANT;  Surgeon: Irene Limbo, MD;  Location: [place];  Service: Plastics;  Laterality: Right;   WISDOM TOOTH EXTRACTION      SOCIAL HISTORY: Social History   Socioeconomic History   Marital status: Single    Spouse name: Not on  file   Number of children: Not on file   Years of education: Not on file   Highest education level: Not on file  Occupational History   Not on file  Tobacco Use   Smoking status: Never   Smokeless tobacco: Never  Vaping Use   Vaping Use: Never used  Substance and Sexual Activity   Alcohol use: Yes    Comment: social   Drug use: No   Sexual activity: Yes    Birth control/protection: Other-see comments    Comment: no periods since chemo  Other Topics Concern   Not on file  Social History Narrative   Not on file   Social Determinants of Health   Financial Resource Strain: Not on file  Food Insecurity: Not on file   Transportation Needs: Not on file  Physical Activity: Not on file  Stress: Not on file  Social Connections: Not on file  Intimate Partner Violence: Not on file    FAMILY HISTORY: Family History  Problem Relation Age of Onset   Heart disease Father 21       CABG   Breast cancer Maternal Aunt 39   Lung cancer Maternal Aunt        60's. unsure if it was a primary LG or a met from breast recurrence.  She had a brain met as well.  She died at 66.   Lung cancer Paternal Aunt        dx. late 42's, died in 24's.   Heart disease Paternal Grandmother    Heart disease Paternal Grandfather     Review of Systems  Constitutional:  Negative for appetite change, chills, fatigue, fever and unexpected weight change.  HENT:   Negative for hearing loss, lump/mass and trouble swallowing.   Eyes:  Negative for eye problems and icterus.  Respiratory:  Negative for chest tightness, cough and shortness of breath.   Cardiovascular:  Negative for chest pain, leg swelling and palpitations.  Gastrointestinal:  Negative for abdominal distention, abdominal pain, constipation, diarrhea, nausea and vomiting.  Endocrine: Negative for hot flashes.  Genitourinary:  Negative for difficulty urinating.   Musculoskeletal:  Negative for arthralgias.  Skin:  Negative for itching and rash.  Neurological:  Negative for dizziness, extremity weakness, headaches and numbness.  Hematological:  Negative for adenopathy. Does not bruise/bleed easily.  Psychiatric/Behavioral:  Negative for depression. The patient is not nervous/anxious.      PHYSICAL EXAMINATION  ECOG PERFORMANCE STATUS: 0 - Asymptomatic  Vitals:   06/15/21 1214  BP: 129/64  Pulse: 87  Resp: 16  Temp: (!) 97.5 F (36.4 C)  SpO2: 96%    Physical Exam Constitutional:      General: She is not in acute distress.    Appearance: Normal appearance. She is not toxic-appearing.  HENT:     Head: Normocephalic and atraumatic.  Eyes:     General: No  scleral icterus. Cardiovascular:     Rate and Rhythm: Normal rate and regular rhythm.     Pulses: Normal pulses.     Heart sounds: Normal heart sounds.  Pulmonary:     Effort: Pulmonary effort is normal.     Breath sounds: Normal breath sounds.  Abdominal:     General: Abdomen is flat. Bowel sounds are normal. There is no distension.     Palpations: Abdomen is soft.     Tenderness: There is no abdominal tenderness.  Musculoskeletal:        General: No swelling.     Cervical  back: Neck supple.  Lymphadenopathy:     Cervical: No cervical adenopathy.  Skin:    General: Skin is warm and dry.     Findings: No rash.  Neurological:     General: No focal deficit present.     Mental Status: She is alert.  Psychiatric:        Mood and Affect: Mood normal.        Behavior: Behavior normal.    LABORATORY DATA:  CBC    Component Value Date/Time   WBC 7.2 06/15/2021 1307   WBC 7.3 08/01/2017 1527   RBC 4.93 06/15/2021 1307   HGB 14.6 06/15/2021 1307   HGB 12.1 05/10/2017 0811   HCT 43.2 06/15/2021 1307   HCT 36.5 05/10/2017 0811   PLT 213 06/15/2021 1307   PLT 272 05/10/2017 0811   MCV 87.6 06/15/2021 1307   MCV 95.1 05/10/2017 0811   MCH 29.6 06/15/2021 1307   MCHC 33.8 06/15/2021 1307   RDW 12.9 06/15/2021 1307   RDW 14.5 05/10/2017 0811   LYMPHSABS 2.6 06/15/2021 1307   LYMPHSABS 0.9 05/10/2017 0811   MONOABS 0.6 06/15/2021 1307   MONOABS 0.3 05/10/2017 0811   EOSABS 0.2 06/15/2021 1307   EOSABS 0.3 05/10/2017 0811   BASOSABS 0.0 06/15/2021 1307   BASOSABS 0.1 05/10/2017 0811    CMP      ASSESSMENT and THERAPY PLAN:   Malignant neoplasm of upper-outer quadrant of right breast in female, estrogen receptor positive ([place]) 12/28/2016: Right lumpectomy: IDC grade 2, 1.2 cm, broadly 0.1 cm to the posterior margin, 0/5 lymph nodes negative, ER 0%, PR 0%, HER-2 negative ratio 1.05, Ki-67 5%, T1 cN0 stage IB AJCC 8   Recommendation: 1. Adjuvant chemotherapy with dose  dense Adriamycin and Cytoxan every 2 weeks 4 followed by weekly Taxol 12 completed 06/14/2017 2. followed by adjuvant radiation therapy 3. Tamoxifen began in 2019 followed by letrozole in 12/2020 ---------------------------------------------------------------------- New diagnosis of DCIS estrogen receptor positive:  08/08/2017 right mastectomy: No evidence of residual breast cancer.   Current treatment: Letrozole   Tolerating letrozole well.   1. Osteoporosis: Reviewed options of weight bearing exercises, calcium, and vitamin d.  Discussed bisphosphanates and risks versus benefits.  Paxtyn would like to start Fosamax weekly.  We reviewed side effects in detail.  We will get CBC, CMET and Vitamin D level today.  I recommended she keep regular f/u with her dentist and make sure they know she is taking fosamax.  She will let us know if she needs to undergo dental extraction or implant because in that case we will need to hold the fosamax.    2. Breast cancer: No clinical sign of recurrence.  She will continue on Letrozole daily.  We are awaiting copies of her most recent mammogram results from [facility].  I placed orders for her breast MRI due in 10/2021 as her initial breast cancer was mammographically occult.    3. Health maintenance and wellness: I recommended healthy diet, exercise, and that she keep up with her PCP f/u and cancer screening.     Return to clinic in 6 months for labs and f/u with Dr. Lindi Adie.      All questions were answered. The patient knows to call the clinic with any problems, questions or concerns. We can certainly see the patient much sooner if necessary.  Total encounter time: 30 minutes in face to face visit time, chart review, lab review, care coordination, patient education and counseling, and documentation of the  encounter.   Wilber Bihari, NP 06/15/21 10:48 PM Medical Oncology and Hematology [facility] [place], [place] [address] Tel.  [phone]    Fax. [phone]  *Total Encounter Time as defined by the Centers for Medicare and Medicaid Services includes, in addition to the face-to-face time of a patient visit (documented in the note above) non-face-to-face time: obtaining and reviewing outside history, ordering and reviewing medications, tests or procedures, care coordination (communications with other health care professionals or caregivers) and documentation in the medical record.

## 2021-06-15 NOTE — Assessment & Plan Note (Signed)
12/28/2016: Right lumpectomy: IDC grade 2, 1.2 cm, broadly 0.1 cm to the posterior margin, 0/5 lymph nodes negative, ER 0%, PR 0%, HER-2 negative ratio 1.05, Ki-67 5%, T1 cN0 stage IB AJCC 8  Recommendation: 1. Adjuvant chemotherapy with dose dense Adriamycin and Cytoxan every 2 weeks 4 followed by weekly Taxol 12 completed 06/14/2017 2. followed by adjuvant radiation therapy 3. Tamoxifen began in 2019 followed by letrozole in 12/2020 ---------------------------------------------------------------------- New diagnosis of DCIS estrogen receptor positive: 08/08/2017 right mastectomy: No evidence of residual breast cancer.  Current treatment:Letrozole  Tolerating letrozole well.   1. Osteoporosis: Reviewed options of weight bearing exercises, calcium, and vitamin d.  Discussed bisphosphanates and risks versus benefits.  Lorraine Dean would like to start Fosamax weekly.  We reviewed side effects in detail.  We will get CBC, CMET and Vitamin D level today.  I recommended she keep regular f/u with her dentist and make sure they know she is taking fosamax.  She will let us know if she needs to undergo dental extraction or implant because in that case we will need to hold the fosamax.    2. Breast cancer: No clinical sign of recurrence.  She will continue on Letrozole daily.  We are awaiting copies of her most recent mammogram results from [place].  I placed orders for her breast MRI due in 10/2021 as her initial breast cancer was mammographically occult.    3. Health maintenance and wellness: I recommended healthy diet, exercise, and that she keep up with her PCP f/u and cancer screening.    Return to clinic in 6 months for labs and f/u with Dr. Lindi Adie.

## 2021-06-15 NOTE — Patient Instructions (Signed)
Alendronate Tablets What is this medication? ALENDRONATE (a LEN droe nate) prevents and treats osteoporosis. It may also be used to treat Paget disease of the bone. It works by Paramedic stronger and less likely to break (fracture). It belongs to a group of medications called bisphosphonates. This medicine may be used for other purposes; ask your health care provider or pharmacist if you have questions. COMMON BRAND NAME(S): Fosamax What should I tell my care team before I take this medication? They need to know if you have any of these conditions: Bleeding disorder Cancer Dental disease Difficulty swallowing Infection (fever, chills, cough, sore throat, pain or trouble passing urine) Kidney disease Low levels of calcium or other minerals in the blood Low red blood cell counts Receiving steroids like dexamethasone or prednisone Stomach or intestine problems Trouble sitting or standing for 30 minutes An unusual or allergic reaction to alendronate, other medications, foods, dyes or preservatives Pregnant or trying to get pregnant Breast-feeding How should I use this medication? Take this medication by mouth with a full glass of water. Take it as directed on the prescription label at the same time every day. Take the dose right after waking up. Do not eat or drink anything before taking it. Do not take it with any other drink except water. Do not chew or crush the tablet. After taking it, do not eat breakfast, drink, or take any other medications or vitamins for at least 30 minutes. Sit or stand up for at least 30 minutes after you take it. Do not lie down. Keep taking it unless your care team tells you to stop. A special MedGuide will be given to you by the pharmacist with each prescription and refill. Be sure to read this information carefully each time. Talk to your care team about the use of this medication in children. Special care may be needed. Overdosage: If you think you have  taken too much of this medicine contact a poison control center or emergency room at once. NOTE: This medicine is only for you. Do not share this medicine with others. What if I miss a dose? If you take your medication once a day, skip it. Take your next dose at the scheduled time the next morning. Do not take two doses on the same day. If you take your medication once a week, take the missed dose on the morning after you remember. Do not take two doses on the same day. What may interact with this medication? Aluminum hydroxide Antacids Aspirin Calcium supplements Medications for inflammation like ibuprofen, naproxen, and others Iron supplements Magnesium supplements Vitamins with minerals This list may not describe all possible interactions. Give your health care provider a list of all the medicines, herbs, non-prescription drugs, or dietary supplements you use. Also tell them if you smoke, drink alcohol, or use illegal drugs. Some items may interact with your medicine. What should I watch for while using this medication? Visit your care team for regular checks on your progress. It may be some time before you see the benefit from this medication. Some people who take this medication have severe bone, joint, or muscle pain. This medication may also increase your risk for jaw problems or a broken thigh bone. Tell your care team right away if you have severe pain in your jaw, bones, joints, or muscles. Tell you care team if you have any pain that does not go away or that gets worse. Tell your dentist and dental surgeon that you are  taking this medication. You should not have major dental surgery while on this medication. See your dentist to have a dental exam and fix any dental problems before starting this medication. Take good care of your teeth while on this medication. Make sure you see your dentist for regular follow-up appointments. You should make sure you get enough calcium and vitamin D  while you are taking this medication. Discuss the foods you eat and the vitamins you take with your care team. You may need blood work done while you are taking this medication. What side effects may I notice from receiving this medication? Side effects that you should report to your care team as soon as possible: Allergic reactions--skin rash, itching, hives, swelling of the face, lips, tongue, or throat Low calcium level--muscle pain or cramps, confusion, tingling, or numbness in the hands or feet Osteonecrosis of the jaw--pain, swelling, or redness in the mouth, numbness of the jaw, poor healing after dental work, unusual discharge from the mouth, visible bones in the mouth Pain or trouble swallowing Severe bone, joint, or muscle pain Stomach bleeding--bloody or black, tar-like stools, vomiting blood or brown material that looks like coffee grounds Side effects that usually do not require medical attention (report to your care team if they continue or are bothersome): Constipation Diarrhea Nausea Stomach pain This list may not describe all possible side effects. Call your doctor for medical advice about side effects. You may report side effects to FDA at 1-800-FDA-1088. Where should I keep my medication? Keep out of the reach of children and pets. Store at room temperature between 15 and 30 degrees C (59 and 86 degrees F). Throw away any unused medication after the expiration date. NOTE: This sheet is a summary. It may not cover all possible information. If you have questions about this medicine, talk to your doctor, pharmacist, or health care provider.  2022 Elsevier/Gold Standard (2020-05-05 00:00:00) Bone Health Bones protect organs, store calcium, anchor muscles, and support the whole body. Keeping your bones strong is important, especially as you get older. You can take actions to help keep your bones strong and healthy. Why is keeping my bones healthy important? Keeping your bones  healthy is important because your body constantly replaces bone cells. Cells get old, and new cells take their place. As we age, we lose bone cells because the body may not be able to make enough new cells to replace the old cells. The amount of bone cells and bone tissue you have is referred to as bone mass. The higher your bone mass, the stronger your bones. The aging process leads to an overall loss of bone mass in the body, which can increase the likelihood of: Broken bones. A condition in which the bones become weak and brittle (osteoporosis). A large decline in bone mass occurs in older adults. In women, it occurs about the time of menopause. What actions can I take to keep my bones healthy? Good health habits are important for maintaining healthy bones. This includes eating nutritious foods and exercising regularly. To have healthy bones, you need to get enough of the right minerals and vitamins. Most nutrition experts recommend getting these nutrients from the foods that you eat. In some cases, taking supplements may also be recommended. Doing certain types of exercise is also important for bone health. What are the nutritional recommendations for healthy bones? Eating a well-balanced diet with plenty of calcium and vitamin D will help to protect your bones. Nutritional recommendations vary from  person to person. Ask your health care provider what is healthy for you. Here are some general guidelines. Get enough calcium Calcium is the most important (essential) mineral for bone health. Most people can get enough calcium from their diet, but supplements may be recommended for people who are at risk for osteoporosis. Good sources of calcium include: Dairy products, such as low-fat or nonfat milk, cheese, and yogurt. Dark green leafy vegetables, such as bok choy and broccoli. Foods that have calcium added to them (are fortified). Foods that may be fortified with calcium include orange juice, cereal,  bread, soy beverages, and tofu products. Nuts, such as almonds. Follow these recommended amounts for daily calcium intake: Infants, 0-6 months: 200 mg. Infants, 6-12 months: 260 mg. Children, age 32-3: 700 mg. Children, age 95-8: 1,000 mg. Children, age 950-13: 1,300 mg. Teens, age 57-18: 1,300 mg. Adults, age 323-50: 1,000 mg. Adults, age 53-70: Men: 1,000 mg. Women: 1,200 mg. Adults, age 32 or older: 1,200 mg. Pregnant and breastfeeding females: Teens: 1,300 mg. Adults: 1,000 mg. Get enough vitamin D Vitamin D is the most essential vitamin for bone health. It helps the body absorb calcium. Sunlight stimulates the skin to make vitamin D, so be sure to get enough sunlight. If you live in a cold climate or you do not get outside often, your health care provider may recommend that you take vitamin D supplements. Good sources of vitamin D in your diet include: Egg yolks. Saltwater fish. Milk and cereal fortified with vitamin D. Follow these recommended amounts for daily vitamin D intake: Infants, 0-12 months: 400 international units (IU). Children and teens, age 32-18: 32 international units. Adults, age 69 or younger: 41 international units. Adults, age 35 or older: 80-1,000 international units. Get other important nutrients Other nutrients that are important for bone health include: Phosphorus. This mineral is found in meat, poultry, dairy foods, nuts, and legumes. The recommended daily intake for adult men and adult women is 700 mg. Magnesium. This mineral is found in seeds, nuts, dark green vegetables, and legumes. The recommended daily intake for adult men is 400-420 mg. For adult women, it is 310-320 mg. Vitamin K. This vitamin is found in green leafy vegetables. The recommended daily intake is 120 mcg for adult men and 90 mcg for adult women. What type of physical activity is best for building and maintaining healthy bones? Weight-bearing and strength-building activities are  important for building and maintaining healthy bones. Weight-bearing activities cause muscles and bones to work against gravity. Strength-building activities increase the strength of the muscles that support bones. Weight-bearing and muscle-building activities include: Walking and hiking. Jogging and running. Dancing. Gym exercises. Lifting weights. Tennis and racquetball. Climbing stairs. Aerobics. Adults should get at least 30 minutes of moderate physical activity on most days. Children should get at least 60 minutes of moderate physical activity on most days. Ask your health care provider what type of exercise is best for you. How can I find out if my bone mass is low? Bone mass can be measured with an X-ray test called a bone mineral density (BMD) test. This test is recommended for all women who are age 65 or older. It may also be recommended for: Men who are age 20 or older. People who are at risk for osteoporosis because of: Having a long-term disease that weakens bones, such as kidney disease or rheumatoid arthritis. Having menopause earlier than normal. Taking medicine that weakens bones, such as steroids, thyroid hormones, or hormone treatment for  breast cancer or prostate cancer. Smoking. Drinking three or more alcoholic drinks a day. Being underweight. Sedentary lifestyle. If you find that you have a low bone mass, you may be able to prevent osteoporosis or further bone loss by changing your diet and lifestyle. Where can I find more information? Bone Health & Osteoporosis Foundation: [employer] of Health: www.bones.SouthExposed.es International Osteoporosis Foundation: Administrator.iofbonehealth.org Summary The aging process leads to an overall loss of bone mass in the body, which can increase the likelihood of broken bones and osteoporosis. Eating a well-balanced diet with plenty of calcium and vitamin D will help to protect your bones. Weight-bearing and  strength-building activities are also important for building and maintaining strong bones. Bone mass can be measured with an X-ray test called a bone mineral density (BMD) test. This information is not intended to replace advice given to you by your health care provider. Make sure you discuss any questions you have with your health care provider. Document Revised: 10/05/2020 Document Reviewed: 10/05/2020 Elsevier Patient Education  [place].

## 2021-06-16 ENCOUNTER — Telehealth: Payer: Self-pay

## 2021-06-16 DIAGNOSIS — C50411 Malignant neoplasm of upper-outer quadrant of right female breast: Secondary | ICD-10-CM

## 2021-06-16 NOTE — Telephone Encounter (Signed)
Called pt, per [place] vit d low.  Advised pt she should begin a vit d supplement, 5000IU daily and we will recheck her labs at her next visit.  Pt verbalized understanding and thanks

## 2021-06-16 NOTE — Telephone Encounter (Signed)
-----   Message from Gardenia Phlegm, NP sent at 06/15/2021 11:16 PM EST ----- Vitamin d level is low.  Recommend vitamin d3 5000 iu daily, repeat labs in 6 months ordered for when she sees Dr. Lindi Adie in f/u.  ----- Message ----- From: Interface, Lab In [place] Sent: 06/15/2021   1:14 PM EST To: Gardenia Phlegm, NP

## 2021-11-15 ENCOUNTER — Ambulatory Visit (HOSPITAL_COMMUNITY): Payer: BC Managed Care – PPO

## 2021-11-15 ENCOUNTER — Telehealth: Payer: Self-pay | Admitting: Surgery

## 2021-11-15 NOTE — Telephone Encounter (Signed)
I called the pt to give her the new scheduled date for her breast MRI, which is November 23, 2021, at 3 pm.  The pt is to arrive at [place] at 2:30.  The pt verbalized understanding of this appointment and was told to call our office if she had any questions or concerns.

## 2021-11-21 ENCOUNTER — Other Ambulatory Visit: Payer: Self-pay | Admitting: Hematology and Oncology

## 2021-11-23 ENCOUNTER — Ambulatory Visit (HOSPITAL_COMMUNITY)
Admission: RE | Admit: 2021-11-23 | Discharge: 2021-11-23 | Disposition: A | Discharge status: Home / Self Care | Payer: BC Managed Care – PPO | Source: Ambulatory Visit | Attending: Adult Health | Admitting: Adult Health

## 2021-11-23 DIAGNOSIS — C50411 Malignant neoplasm of upper-outer quadrant of right female breast: Secondary | ICD-10-CM | POA: Insufficient documentation

## 2021-11-23 DIAGNOSIS — M816 Localized osteoporosis [Lequesne]: Secondary | ICD-10-CM | POA: Diagnosis present

## 2021-11-23 DIAGNOSIS — Z170 Estrogen receptor positive status [ER+]: Secondary | ICD-10-CM | POA: Insufficient documentation

## 2021-11-23 MED ORDER — GADOBUTROL 1 MMOL/ML IV SOLN
5.0000 mL | Freq: Once | INTRAVENOUS | Status: AC | PRN
  Administered 2021-11-23: 5 mL via INTRAVENOUS

## 2021-11-24 ENCOUNTER — Telehealth: Payer: Self-pay

## 2021-11-24 ENCOUNTER — Telehealth: Payer: Self-pay | Admitting: *Deleted

## 2021-11-24 NOTE — Telephone Encounter (Signed)
Received call from pt regarding missed office call this morning.  RN provided pt results of recent Breast MRI showing no evidence of malignancy.  Pt appreciative of the call and verbalized understanding.

## 2021-11-24 NOTE — Telephone Encounter (Signed)
-----   Message from Gardenia Phlegm, NP sent at 11/24/2021  2:55 PM EDT ----- Please call patient and let her know MRI is negative for malignancy. ----- Message ----- From: Interface, Rad Results In Sent: 11/23/2021   7:08 PM EDT To: Gardenia Phlegm, NP

## 2021-11-24 NOTE — Telephone Encounter (Signed)
Attempted to call pt to review results per NP. Pt did not answer. LVM for call back.

## 2021-12-20 NOTE — Progress Notes (Signed)
Patient Care Team: [name], MD as PCP - General (Family Medicine) Nicholas Lose, MD as Consulting Physician (Hematology and Oncology) [name], MD as Consulting Physician (General Surgery) Gery Pray, MD as Consulting Physician (Radiation Oncology) Delice Bison Charlestine Massed, NP as Nurse Practitioner (Hematology and Oncology)  DIAGNOSIS: No diagnosis found.  SUMMARY OF ONCOLOGIC HISTORY: Oncology History  Malignant neoplasm of upper-outer quadrant of right breast in female, estrogen receptor positive ([place])  12/05/2016 Initial Diagnosis   Screening detected calcifications Rt breast spanning 1.5 cm, U/S measured a lesion 1.2 cm size, Biopsy revealed IDC Grade 3, ER/PR Negative, Her 2 Neg Ratio: 1.05; Ki 67: 20%; T1bN0 Stage 1A Clinical stage   12/24/2016 Genetic Testing   The patient had genetic testing due to a personal and family history of breast cancer.  The [facility] Common Hereditary Cancer Panel + Melanoma panel was sent out. The Hereditary Gene Panel + Melanoma Panel offered by [facility] includes sequencing and/or deletion duplication testing of the following 53 genes: APC, ATM, AXIN2, BAP1, BARD1, BMPR1A, BRCA1, BRCA2, BRIP1, CDH1, CDK4, CDKN2A (p14ARF), CDKN2A (p16INK4a), CHEK2, CTNNA1, DICER1, EPCAM (Deletion/duplication testing only), GREM1 (promoter region deletion/duplication testing only), KIT, MC1R, MEN1, MLH1, MSH2, MSH3, MSH6, MUTYH, NBN, NF1, NHTL1, PALB2, PDGFRA, PMS2, POLD1, POLE, POT1, PTEN, RAD50, RAD51C, RAD51D, RB1, SDHB, SDHC, SDHD, SMAD4, SMARCA4. STK11, TERT, TP53, TSC1, TSC2, and VHL.  The following genes were evaluated for sequence changes only: MITF, SDHA and HOXB13 c.251G>A variant only.  Results: No pathogenic mutations identified. 3 VU'sS in BRCA1 c.2967T>A (p.Phe989Leu) , MSH6 c.2857G>A (p.Glu953Lys), and TERT c.902G>A (p.Arg301His) were identified.  The date of this test result is 12/24/2016.    UPDATE:  The MSH6 c.2857G>A VUS was reclassified to  "Likely Benign" on 06/28/2020. The change in variant classification was made as a result of re-review of the evidence in light of new variant interpretation guidelines and/or new information.   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 Breast reconstruction 12/20/2017   09/2017 -  Anti-estrogen oral therapy   Tamoxifen daily started 08/14/2017 discontinued 12/19/2020 and switched to letrozole (because of uterine polyps and spotting) plan is to treat for 5 years total.  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 She is currently on anti-estrogen therapy with tamoxifen. She presents to the clinic today for follow-up.    ALLERGIES:  has No Known Allergies.  MEDICATIONS:  Current Outpatient Medications  Medication Sig Dispense Refill   alendronate (FOSAMAX) 70 MG tablet Take 1 tablet (70 mg total) by mouth once a week. Take with a full glass of water on an empty stomach. 12 tablet 3   aspirin EC 81 MG tablet  Take 81 mg by mouth 3 (three) times a week.      atorvastatin (LIPITOR) 10 MG tablet Take 10 mg by mouth every evening.     Biotin w/ Vitamins C & E (HAIR/SKIN/NAILS PO) Take 1 tablet by mouth  every evening.     folic acid (FOLVITE) 1 MG tablet Take 1 tablet (1 mg total) by mouth daily.     Krill Oil 350 MG CAPS Take 350 mg by mouth daily.     letrozole (FEMARA) 2.5 MG tablet TAKE 1 TABLET DAILY 90 tablet 3   mometasone (NASONEX) 50 MCG/ACT nasal spray 2 SPRAYS IN EACH NOSTRIL ONCE A DAY IF NEEDED NASALLY  2   Multiple Vitamins-Minerals (OCUVITE ADULT FORMULA) CAPS Take by mouth.     Multiple Vitamins-Minerals (OCUVITE PO) Take 1 tablet by mouth every evening.     naproxen sodium (ALEVE) 220 MG tablet Take 220-440 mg by mouth 2 (two) times daily as needed (for pain.).     Probiotic Product (ALIGN PO) Take 1 capsule by mouth every evening.     No current facility-administered medications for this visit.    PHYSICAL EXAMINATION: ECOG PERFORMANCE STATUS: {CHL ONC ECOG PS:[phone]}  There were no vitals filed for this visit. There were no vitals filed for this visit.  BREAST:*** No palpable masses or nodules in either right or left breasts. No palpable axillary supraclavicular or infraclavicular adenopathy no breast tenderness or nipple discharge. (exam performed in the presence of a chaperone)  LABORATORY DATA:  I have reviewed the data as listed    Latest Ref Rng & Units 06/15/2021    1:07 PM 08/01/2017    3:27 PM 06/14/2017    8:21 AM  CMP  Glucose 70 - 99 mg/dL 91  101  85   BUN 6 - 20 mg/dL $Remove'11  10  8   'xSkZRDY$ Creatinine 0.44 - 1.00 mg/dL 0.63  0.65  0.64   Sodium 135 - 145 mmol/L 139  140  142   Potassium 3.5 - 5.1 mmol/L 3.7  4.2  3.7   Chloride 98 - 111 mmol/L 104  104  107   CO2 22 - 32 mmol/L $RemoveB'29  25  25   'zyPYzFFZ$ Calcium 8.9 - 10.3 mg/dL 9.5  9.2  9.3   Total Protein 6.5 - 8.1 g/dL 7.7   6.4   Total Bilirubin 0.3 - 1.2 mg/dL 0.5   0.2   Alkaline Phos 38 - 126 U/L 70   70   AST 15 - 41 U/L 23   23   ALT 0 - 44 U/L 25   25     Lab Results  Component Value Date   WBC 7.2 06/15/2021   HGB 14.6 06/15/2021   HCT 43.2 06/15/2021   MCV 87.6 06/15/2021   PLT 213 06/15/2021    NEUTROABS 3.9 06/15/2021    ASSESSMENT & PLAN:  No problem-specific Assessment & Plan notes found for this encounter.    No orders of the defined types were placed in this encounter.  The patient has a good understanding of the overall plan. she agrees with it. she will call with any problems that may develop before the next visit here. Total time spent: 30 mins including face to face time and time spent for planning, charting and co-ordination of care   Suzzette Righter, [place] 12/20/21

## 2021-12-21 ENCOUNTER — Other Ambulatory Visit: Payer: Self-pay

## 2021-12-21 ENCOUNTER — Inpatient Hospital Stay: Payer: BC Managed Care – PPO | Attending: Hematology and Oncology | Admitting: Hematology and Oncology

## 2021-12-21 ENCOUNTER — Inpatient Hospital Stay: Payer: BC Managed Care – PPO

## 2021-12-21 VITALS — BP 123/63 | HR 96 | Temp 97.5°F | Resp 18 | Ht 61.0 in | Wt 121.7 lb

## 2021-12-21 DIAGNOSIS — Z79899 Other long term (current) drug therapy: Secondary | ICD-10-CM | POA: Insufficient documentation

## 2021-12-21 DIAGNOSIS — Z170 Estrogen receptor positive status [ER+]: Secondary | ICD-10-CM

## 2021-12-21 DIAGNOSIS — Z9011 Acquired absence of right breast and nipple: Secondary | ICD-10-CM | POA: Diagnosis not present

## 2021-12-21 DIAGNOSIS — M816 Localized osteoporosis [Lequesne]: Secondary | ICD-10-CM

## 2021-12-21 DIAGNOSIS — C50411 Malignant neoplasm of upper-outer quadrant of right female breast: Secondary | ICD-10-CM

## 2021-12-21 DIAGNOSIS — Z79811 Long term (current) use of aromatase inhibitors: Secondary | ICD-10-CM | POA: Insufficient documentation

## 2021-12-21 LAB — CBC WITH DIFFERENTIAL (CANCER CENTER ONLY)
Abs Immature Granulocytes: 0.02 10*3/uL (ref 0.00–0.07)
Basophils Absolute: 0.1 10*3/uL (ref 0.0–0.1)
Basophils Relative: 1 %
Eosinophils Absolute: 0.2 10*3/uL (ref 0.0–0.5)
Eosinophils Relative: 3 %
HCT: 38.6 % (ref 36.0–46.0)
Hemoglobin: 13.5 g/dL (ref 12.0–15.0)
Immature Granulocytes: 0 %
Lymphocytes Relative: 30 %
Lymphs Abs: 2.2 10*3/uL (ref 0.7–4.0)
MCH: 29.9 pg (ref 26.0–34.0)
MCHC: 35 g/dL (ref 30.0–36.0)
MCV: 85.6 fL (ref 80.0–100.0)
Monocytes Absolute: 0.5 10*3/uL (ref 0.1–1.0)
Monocytes Relative: 6 %
Neutro Abs: 4.4 10*3/uL (ref 1.7–7.7)
Neutrophils Relative %: 60 %
Platelet Count: 223 10*3/uL (ref 150–400)
RBC: 4.51 MIL/uL (ref 3.87–5.11)
RDW: 13.4 % (ref 11.5–15.5)
WBC Count: 7.3 10*3/uL (ref 4.0–10.5)
nRBC: 0 % (ref 0.0–0.2)

## 2021-12-21 LAB — CMP (CANCER CENTER ONLY)
ALT: 28 U/L (ref 0–44)
AST: 26 U/L (ref 15–41)
Albumin: 4.5 g/dL (ref 3.5–5.0)
Alkaline Phosphatase: 54 U/L (ref 38–126)
Anion gap: 9 (ref 5–15)
BUN: 11 mg/dL (ref 6–20)
CO2: 24 mmol/L (ref 22–32)
Calcium: 9.1 mg/dL (ref 8.9–10.3)
Chloride: 108 mmol/L (ref 98–111)
Creatinine: 0.82 mg/dL (ref 0.44–1.00)
GFR, Estimated: >60 mL/min (ref >60)
Glucose, Bld: 106 mg/dL — ABNORMAL HIGH (ref 70–99)
Potassium: 3.5 mmol/L (ref 3.5–5.1)
Sodium: 141 mmol/L (ref 135–145)
Total Bilirubin: 0.3 mg/dL (ref 0.3–1.2)
Total Protein: 7.8 g/dL (ref 6.5–8.1)

## 2021-12-21 LAB — VITAMIN D 25 HYDROXY (VIT D DEFICIENCY, FRACTURES): Vit D, 25-Hydroxy: 72.48 ng/mL (ref 30–100)

## 2021-12-21 NOTE — Assessment & Plan Note (Addendum)
12/28/2016: Right lumpectomy: IDC grade 2, 1.2 cm, broadly 0.1 cm to the posterior margin, 0/5 lymph nodes negative, ER 0%, PR 0%, HER-2 negative ratio 1.05, Ki-67 5%, T1 cN0 stage IB AJCC 8  Recommendation: 1. Adjuvant chemotherapy with dose dense Adriamycin and Cytoxan every 2 weeks 4 followed by weekly Taxol 12 completed 06/14/2017 2. followed by adjuvant radiation therapy ---------------------------------------------------------------------- New diagnosis of DCIS estrogen receptor positive: 08/08/2017 right mastectomy: No evidence of residual breast cancer.  Current treatment:Tamoxifen 20 mg daily started 08/14/2017 discontinued 12/19/2020 and switched to letrozole (because of uterine polyps and spotting) plan is to treat for 5 years total.  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

## 2021-12-25 ENCOUNTER — Telehealth: Payer: Self-pay | Admitting: Hematology and Oncology

## 2021-12-25 NOTE — Telephone Encounter (Signed)
Scheduled appointment per 8/17 los. Patient is aware. 

## 2021-12-26 ENCOUNTER — Telehealth: Payer: Self-pay

## 2021-12-26 NOTE — Telephone Encounter (Signed)
Patient notified of the following message regarding recent lab values.  Patient verbalized an understanding of the information. All questions answered during call.

## 2021-12-26 NOTE — Telephone Encounter (Signed)
-----   Message from Gardenia Phlegm, NP sent at 12/21/2021 10:11 PM EDT ----- Labs look good, please notify patient ----- Message ----- From: [place]: 12/21/2021   2:42 PM EDT To: Gardenia Phlegm, NP

## 2022-04-25 ENCOUNTER — Other Ambulatory Visit: Payer: Self-pay | Admitting: Adult Health

## 2022-04-25 DIAGNOSIS — M816 Localized osteoporosis [Lequesne]: Secondary | ICD-10-CM

## 2022-06-14 ENCOUNTER — Encounter: Payer: Self-pay | Admitting: Hematology and Oncology

## 2022-11-16 ENCOUNTER — Other Ambulatory Visit: Payer: Self-pay | Admitting: Hematology and Oncology

## 2022-11-23 ENCOUNTER — Encounter: Payer: Self-pay | Admitting: Hematology and Oncology

## 2022-11-26 ENCOUNTER — Other Ambulatory Visit: Payer: BC Managed Care – PPO

## 2022-12-11 ENCOUNTER — Other Ambulatory Visit: Payer: Self-pay | Admitting: Hematology and Oncology

## 2022-12-11 DIAGNOSIS — Z170 Estrogen receptor positive status [ER+]: Secondary | ICD-10-CM

## 2022-12-18 ENCOUNTER — Telehealth: Payer: Self-pay | Admitting: Hematology and Oncology

## 2022-12-18 NOTE — Telephone Encounter (Signed)
Rescheduled appointment per provider PAL. Left voicemail for patient. 

## 2022-12-27 ENCOUNTER — Ambulatory Visit: Payer: BC Managed Care – PPO | Admitting: Hematology and Oncology

## 2023-01-25 ENCOUNTER — Ambulatory Visit
Admission: RE | Admit: 2023-01-25 | Discharge: 2023-01-25 | Disposition: A | Discharge status: Home / Self Care | Payer: BC Managed Care – PPO | Source: Ambulatory Visit | Attending: Hematology and Oncology

## 2023-01-25 DIAGNOSIS — C50411 Malignant neoplasm of upper-outer quadrant of right female breast: Secondary | ICD-10-CM

## 2023-01-25 MED ORDER — GADOPICLENOL 0.5 MMOL/ML IV SOLN
5.0000 mL | Freq: Once | INTRAVENOUS | Status: AC | PRN
  Administered 2023-01-25: 5 mL via INTRAVENOUS

## 2023-01-28 ENCOUNTER — Telehealth: Payer: Self-pay | Admitting: Hematology and Oncology

## 2023-01-28 NOTE — Telephone Encounter (Signed)
Left patient a message in regards to scheduled appointment times/dates

## 2023-02-01 ENCOUNTER — Ambulatory Visit: Payer: BC Managed Care – PPO | Admitting: Hematology and Oncology

## 2023-02-05 ENCOUNTER — Telehealth: Payer: Self-pay | Admitting: Hematology and Oncology

## 2023-02-05 NOTE — Telephone Encounter (Signed)
Per Staff message 02/05/23; I called patient and rescheduled her appointment. Patient is aware of the new date and time

## 2023-03-07 ENCOUNTER — Ambulatory Visit: Payer: BC Managed Care – PPO | Admitting: Hematology and Oncology

## 2023-03-18 ENCOUNTER — Inpatient Hospital Stay: Payer: BC Managed Care – PPO | Attending: Hematology and Oncology | Admitting: Hematology and Oncology

## 2023-03-18 VITALS — BP 138/67 | HR 89 | Temp 97.5°F | Resp 18 | Ht 61.0 in | Wt 117.6 lb

## 2023-03-18 DIAGNOSIS — Z170 Estrogen receptor positive status [ER+]: Secondary | ICD-10-CM | POA: Diagnosis not present

## 2023-03-18 DIAGNOSIS — E039 Hypothyroidism, unspecified: Secondary | ICD-10-CM | POA: Diagnosis not present

## 2023-03-18 DIAGNOSIS — Z780 Asymptomatic menopausal state: Secondary | ICD-10-CM | POA: Diagnosis not present

## 2023-03-18 DIAGNOSIS — M816 Localized osteoporosis [Lequesne]: Secondary | ICD-10-CM | POA: Diagnosis not present

## 2023-03-18 DIAGNOSIS — M810 Age-related osteoporosis without current pathological fracture: Secondary | ICD-10-CM | POA: Insufficient documentation

## 2023-03-18 DIAGNOSIS — Z9011 Acquired absence of right breast and nipple: Secondary | ICD-10-CM | POA: Diagnosis not present

## 2023-03-18 DIAGNOSIS — Z79890 Hormone replacement therapy (postmenopausal): Secondary | ICD-10-CM | POA: Insufficient documentation

## 2023-03-18 DIAGNOSIS — Z79899 Other long term (current) drug therapy: Secondary | ICD-10-CM | POA: Diagnosis not present

## 2023-03-18 DIAGNOSIS — Z79810 Long term (current) use of selective estrogen receptor modulators (SERMs): Secondary | ICD-10-CM | POA: Insufficient documentation

## 2023-03-18 DIAGNOSIS — C50411 Malignant neoplasm of upper-outer quadrant of right female breast: Secondary | ICD-10-CM | POA: Diagnosis present

## 2023-03-18 DIAGNOSIS — Z79811 Long term (current) use of aromatase inhibitors: Secondary | ICD-10-CM | POA: Insufficient documentation

## 2023-03-18 MED ORDER — LEVOTHYROXINE SODIUM 25 MCG PO TABS: 25.0000 ug | ORAL_TABLET | Freq: Every day | ORAL | Status: AC

## 2023-03-18 MED ORDER — LEVOTHYROXINE SODIUM 75 MCG PO TABS: 75.0000 ug | ORAL_TABLET | Freq: Every day | ORAL | Status: DC

## 2023-03-18 MED ORDER — ALENDRONATE SODIUM 70 MG PO TABS: 70.0000 mg | ORAL_TABLET | ORAL | 3 refills | Status: DC

## 2023-03-18 NOTE — Assessment & Plan Note (Addendum)
12/28/2016: Right lumpectomy: IDC grade 2, 1.2 cm, broadly 0.1 cm to the posterior margin, 0/5 lymph nodes negative, ER 0%, PR 0%, HER-2 negative ratio 1.05, Ki-67 5%, T1 cN0 stage IB AJCC 8   Recommendation: 1. Adjuvant chemotherapy with dose dense Adriamycin and Cytoxan every 2 weeks 4 followed by weekly Taxol 12 completed 06/14/2017 2. followed by adjuvant radiation therapy ---------------------------------------------------------------------- New diagnosis of DCIS estrogen receptor positive:  08/08/2017 right mastectomy: No evidence of residual breast cancer.   Current treatment: Tamoxifen 20 mg daily started 08/14/2017 discontinued 12/19/2020 and switched to letrozole (because of uterine polyps and spotting) plan is to treat for 5 years total.  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   Breast cancer surveillance: 1.  Breast exam 03/18/2023: No palpable lumps or nodules of concern 2.  Mammogram 06/11/2022 at [facility]: Benign, breast density category C 3.  Breast MRI 01/25/2023: No evidence of malignancy breast density category C   Bone density 07/20/2021: T score -3.4: Osteoporosis On calcium vitamin D along with Fosamax She is due for a bone density in 1 year.  She would like to do it at the same time as her mammogram and February.  return to clinic in 1 year for follow-up

## 2023-03-18 NOTE — Progress Notes (Signed)
Patient Care Team: Farris Has, MD as PCP - General (Family Medicine) Serena Croissant, MD as Consulting Physician (Hematology and Oncology) Griselda Miner, MD as Consulting Physician (General Surgery) Antony Blackbird, MD as Consulting Physician (Radiation Oncology) Axel Filler Larna Daughters, NP as Nurse Practitioner (Hematology and Oncology)  DIAGNOSIS:  Encounter Diagnoses  Name Primary?   Malignant neoplasm of upper-outer quadrant of right breast in female, estrogen receptor positive (HCC) Yes   Post-menopausal    Localized osteoporosis without current pathological fracture     SUMMARY OF ONCOLOGIC HISTORY: Oncology History  Malignant neoplasm of upper-outer quadrant of right breast in female, estrogen receptor positive (HCC)  12/05/2016 Initial Diagnosis   Screening detected calcifications Rt breast spanning 1.5 cm, U/S measured a lesion 1.2 cm size, Biopsy revealed IDC Grade 3, ER/PR Negative, Her 2 Neg Ratio: 1.05; Ki 67: 20%; T1bN0 Stage 1A Clinical stage   12/12/2016 Cancer Staging   Staging form: Breast, AJCC 8th Edition - Clinical stage from 12/12/2016: Stage IB (cT1c, cN0, cM0, G3, ER-, PR-, HER2-) - Signed by Serena Croissant, MD on 03/18/2023 Histologic grading system: 3 grade system Laterality: Right Staged by: Pathologist and managing physician Stage used in treatment planning: Yes National guidelines used in treatment planning: Yes Type of national guideline used in treatment planning: NCCN   12/24/2016 Genetic Testing   The patient had genetic testing due to a personal and family history of breast cancer.  The [facility] Common Hereditary Cancer Panel + Melanoma panel was sent out. The Hereditary Gene Panel + Melanoma Panel offered by [facility] includes sequencing and/or deletion duplication testing of the following 53 genes: APC, ATM, AXIN2, BAP1, BARD1, BMPR1A, BRCA1, BRCA2, BRIP1, CDH1, CDK4, CDKN2A (p14ARF), CDKN2A (p16INK4a), CHEK2, CTNNA1, DICER1, EPCAM (Deletion/duplication  testing only), GREM1 (promoter region deletion/duplication testing only), KIT, MC1R, MEN1, MLH1, MSH2, MSH3, MSH6, MUTYH, NBN, NF1, NHTL1, PALB2, PDGFRA, PMS2, POLD1, POLE, POT1, PTEN, RAD50, RAD51C, RAD51D, RB1, SDHB, SDHC, SDHD, SMAD4, SMARCA4. STK11, TERT, TP53, TSC1, TSC2, and VHL.  The following genes were evaluated for sequence changes only: MITF, SDHA and HOXB13 c.251G>A variant only.  Results: No pathogenic mutations identified. 3 VU'sS in BRCA1 c.2967T>A (p.Phe989Leu) , MSH6 c.2857G>A (p.Glu953Lys), and TERT c.902G>A (p.Arg301His) were identified.  The date of this test result is 12/24/2016.    UPDATE:  The MSH6 c.2857G>A VUS was reclassified to "Likely Benign" on 06/28/2020. The change in variant classification was made as a result of re-review of the evidence in light of new variant interpretation guidelines and/or new information.   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 Breast reconstruction 12/20/2017   09/2017 -  Anti-estrogen oral therapy   Tamoxifen daily started 08/14/2017 discontinued 12/19/2020 and switched to letrozole (because of uterine polyps and spotting) plan is to treat for 5 years total.  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 She has been on tamoxifen for five  years, which she will discontinue after finishing her current bottle. She has been keeping up with her mammograms, MRIs, and bone densities. She has been taking Fosamax without any issues. She has also been diagnosed with hypothyroidism and is taking levothyroxine daily.         ALLERGIES:  has No Known Allergies.  MEDICATIONS:  Current Outpatient Medications  Medication Sig Dispense Refill   alendronate (FOSAMAX) 70 MG tablet Take 1 tablet (70 mg total) by mouth once a week. Take with a full glass of water on an empty stomach. 12 tablet 3   atorvastatin (LIPITOR) 10 MG tablet Take 10 mg by mouth every evening.     Biotin w/ Vitamins C & E (HAIR/SKIN/NAILS PO) Take 1 tablet by mouth every evening.     folic acid (FOLVITE) 1 MG tablet Take 1 tablet (1 mg total) by mouth daily.     Krill Oil 350 MG CAPS Take 350 mg by mouth daily.     letrozole (FEMARA) 2.5 MG tablet TAKE 1 TABLET DAILY 90 tablet 3   levothyroxine (SYNTHROID) 25 MCG tablet Take 1 tablet (25 mcg total) by mouth daily before breakfast.     mometasone (NASONEX) 50 MCG/ACT nasal spray 2 SPRAYS IN EACH NOSTRIL ONCE A DAY IF NEEDED NASALLY  2   Multiple Vitamins-Minerals (OCUVITE ADULT FORMULA) CAPS Take by mouth.     Multiple Vitamins-Minerals (OCUVITE PO) Take 1 tablet by mouth every evening.     naproxen sodium (ALEVE) 220 MG tablet Take 220-440 mg by mouth 2 (two) times daily as needed (for pain.).     Probiotic Product (ALIGN PO) Take 1 capsule by mouth every evening.     No current facility-administered medications for this visit.    PHYSICAL EXAMINATION: ECOG PERFORMANCE STATUS: 1 - Symptomatic but completely ambulatory  Vitals:   03/18/23 1522  BP: 138/67  Pulse: 89  Resp: 18  Temp: (!) 97.5 F (36.4 C)  SpO2: 96%   Filed Weights   03/18/23 1522  Weight: 117 lb 9.6 oz (53.3 kg)    Physical Exam no palpable lumps or nodules in bilateral breasts or axilla.  Right breast reconstructed        (exam  performed in the presence of a chaperone)  LABORATORY DATA:  I have reviewed the data as listed    Latest Ref Rng & Units 12/21/2021    2:29 PM 06/15/2021    1:07 PM 08/01/2017    3:27 PM  CMP  Glucose 70 - 99 mg/dL 696  91  295   BUN 6 - 20 mg/dL 11  11  10    Creatinine 0.44 - 1.00 mg/dL 2.84  1.32  4.40   Sodium 135 - 145 mmol/L 141  139  140   Potassium 3.5 - 5.1 mmol/L 3.5  3.7  4.2   Chloride 98 - 111 mmol/L 108  104  104   CO2 22 - 32 mmol/L 24  29  25    Calcium 8.9 - 10.3 mg/dL 9.1  9.5  9.2   Total Protein 6.5 - 8.1 g/dL 7.8  7.7    Total Bilirubin 0.3 - 1.2 mg/dL 0.3  0.5    Alkaline Phos 38 - 126 U/L 54  70    AST 15 - 41 U/L 26  23    ALT 0 -  44 U/L 28  25      Lab Results  Component Value Date   WBC 7.3 12/21/2021   HGB 13.5 12/21/2021   HCT 38.6 12/21/2021   MCV 85.6 12/21/2021   PLT 223 12/21/2021   NEUTROABS 4.4 12/21/2021    ASSESSMENT & PLAN:  Malignant neoplasm of upper-outer quadrant of right breast in female, estrogen receptor positive (HCC) 12/28/2016: Right lumpectomy: IDC grade 2, 1.2 cm, broadly 0.1 cm to the posterior margin, 0/5 lymph nodes negative, ER 0%, PR 0%, HER-2 negative ratio 1.05, Ki-67 5%, T1 cN0 stage IB AJCC 8   Recommendation: 1. Adjuvant chemotherapy with dose dense Adriamycin and Cytoxan every 2 weeks 4 followed by weekly Taxol 12 completed 06/14/2017 2. followed by adjuvant radiation therapy ---------------------------------------------------------------------- New diagnosis of DCIS estrogen receptor positive:  08/08/2017 right mastectomy: No evidence of residual breast cancer.   Current treatment: Tamoxifen 20 mg daily started 08/14/2017 discontinued 12/19/2020 and switched to letrozole (because of uterine polyps and spotting) plan is to treat for 5 years total.  (This is primarily for the DCIS that was diagnosed in 2019, her original cancer was triple negative.)   Tamoxifen toxicities: Mild hot flashes Denies any arthralgias or  myalgias She has 1 more year left on tamoxifen therapy to complete 5 years.   Breast cancer surveillance: 1.  Breast exam 03/18/2023: No palpable lumps or nodules of concern 2.  Mammogram 06/11/2022 at [facility]: Benign, breast density category C 3.  Breast MRI 01/25/2023: No evidence of malignancy breast density category C   Bone density 07/20/2021: T score -3.4: Osteoporosis On calcium vitamin D along with Fosamax She is due for a bone density in 1 year.  She would like to do it at the same time as her mammogram and February.  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 No current evidence of recurrence. -Discontinue Tamoxifen after current bottle is finished.  Osteoporosis On Fosamax with no reported issues. Reminder given about holding medication around dental procedures. -Continue Fosamax. -Next bone density scan scheduled for February 2025 at [facility].  Hypothyroidism On Levothyroxine daily. -Continue Levothyroxine daily.  Breast Cancer Surveillance Dense breasts (category C) requiring continued mammograms and MRIs. -Schedule mammogram and bone density scan for February 2025. -Schedule MRI for September 2025.  Follow-up in 1 year.          Orders Placed This Encounter  Procedures   DG Bone Density    Standing Status:   Future    Standing Expiration Date:   03/17/2024    Scheduling Instructions:     Please schedule at [facility] at same time as mammograms in Feb 2025    Order Specific Question:   Reason for Exam (SYMPTOM  OR DIAGNOSIS REQUIRED)    Answer:   osteoporosis    Comments:   [facility]    Order Specific Question:   Is patient pregnant?    Answer:   No    Order Specific Question:   Preferred imaging location?    Answer:   External    Order Specific Question:   Release to patient    Answer:   Immediate   MR BREAST  BILATERAL W WO CONTRAST INC CAD    Standing Status:   Future    Standing Expiration Date:   03/17/2024    Order Specific Question:   If indicated for the ordered procedure,  I authorize the administration of contrast media per Radiology protocol    Answer:   Yes    Order Specific Question:   What is the patient's sedation requirement?    Answer:   No Sedation    Order Specific Question:   Does the patient have a pacemaker or implanted devices?    Answer:   No    Order Specific Question:   Preferred imaging location?    Answer:   [facility] (table limit-550lbs)    Order Specific Question:   Release to patient    Answer:   Immediate   The patient has a good understanding of the overall plan. she agrees with it. she will call with any problems that may develop before the next visit here. Total time spent: 30 mins including face to face time and time spent for planning, charting and co-ordination of care   Tamsen Meek, MD 03/18/23

## 2023-06-19 ENCOUNTER — Encounter: Payer: Self-pay | Admitting: Hematology and Oncology

## 2024-01-20 ENCOUNTER — Other Ambulatory Visit: Payer: Self-pay | Admitting: Hematology and Oncology

## 2024-01-21 ENCOUNTER — Inpatient Hospital Stay: Admission: RE | Admit: 2024-01-21 | Source: Ambulatory Visit

## 2024-02-17 ENCOUNTER — Other Ambulatory Visit: Payer: Self-pay | Admitting: Hematology and Oncology

## 2024-02-17 DIAGNOSIS — M816 Localized osteoporosis [Lequesne]: Secondary | ICD-10-CM

## 2024-02-18 ENCOUNTER — Ambulatory Visit
Admission: RE | Admit: 2024-02-18 | Discharge: 2024-02-18 | Disposition: A | Discharge status: Home / Self Care | Source: Ambulatory Visit | Attending: Hematology and Oncology | Admitting: Hematology and Oncology

## 2024-02-18 DIAGNOSIS — C50411 Malignant neoplasm of upper-outer quadrant of right female breast: Secondary | ICD-10-CM

## 2024-02-18 MED ORDER — GADOPICLENOL 0.5 MMOL/ML IV SOLN
5.0000 mL | Freq: Once | INTRAVENOUS | Status: AC | PRN
  Administered 2024-02-18: 5 mL via INTRAVENOUS

## 2024-03-16 ENCOUNTER — Inpatient Hospital Stay: Attending: Hematology and Oncology | Admitting: Hematology and Oncology

## 2024-03-16 VITALS — BP 134/46 | HR 81 | Temp 97.7°F | Resp 18 | Ht 61.0 in | Wt 119.7 lb

## 2024-03-16 DIAGNOSIS — Z170 Estrogen receptor positive status [ER+]: Secondary | ICD-10-CM

## 2024-03-16 DIAGNOSIS — C50411 Malignant neoplasm of upper-outer quadrant of right female breast: Secondary | ICD-10-CM

## 2024-03-16 NOTE — Assessment & Plan Note (Signed)
 12/28/2016: Right lumpectomy: IDC grade 2, 1.2 cm, broadly 0.1 cm to the posterior margin, 0/5 lymph nodes negative, ER 0%, PR 0%, HER-2 negative ratio 1.05, Ki-67 5%, T1 cN0 stage IB AJCC 8   Recommendation: 1. Adjuvant chemotherapy with dose dense Adriamycin  and Cytoxan  every 2 weeks 4 followed by weekly Taxol  12 completed 06/14/2017 2. followed by adjuvant radiation therapy ---------------------------------------------------------------------- New diagnosis of DCIS estrogen receptor positive:  08/08/2017 right mastectomy: No evidence of residual breast cancer.   Current treatment: Tamoxifen  20 mg daily started 08/14/2017 discontinued 12/19/2020 and switched to letrozole  (because of uterine polyps and spotting) plan is to treat for 5 years total.  (This is primarily for the DCIS that was diagnosed in 2019, her original cancer was triple negative.)   Tamoxifen  toxicities: Mild hot flashes Denies any arthralgias or myalgias She has 1 more year left on tamoxifen  therapy to complete 5 years.   Breast cancer surveillance: 1.  Breast exam 03/16/2024: No palpable lumps or nodules of concern 2.  Mammogram 06/17/2023 at [facility]: Benign, breast density category C 3.  Breast MRI 02/18/2024: No evidence of malignancy breast density category C   Bone density 07/20/2021: T score -3.4: Osteoporosis On calcium  vitamin D  along with Fosamax  She is due for a bone density in 1 year.  She would like to do it at the same time as her mammogram and February.   return to clinic in 1 year for follow-up

## 2024-03-16 NOTE — Progress Notes (Signed)
 Patient Care Team: Kip Righter, MD as PCP - General (Family Medicine) Odean Potts, MD as Consulting Physician (Hematology and Oncology) Curvin Deward MOULD, MD as Consulting Physician (General Surgery) Shannon Agent, MD as Consulting Physician (Radiation Oncology) Crawford Morna Pickle, NP as Nurse Practitioner (Hematology and Oncology)  DIAGNOSIS:  Encounter Diagnosis  Name Primary?   Malignant neoplasm of upper-outer quadrant of right breast in female, estrogen receptor positive (HCC) Yes    SUMMARY OF ONCOLOGIC HISTORY: Oncology History  Malignant neoplasm of upper-outer quadrant of right breast in female, estrogen receptor positive (HCC)  12/05/2016 Initial Diagnosis   Screening detected calcifications Rt breast spanning 1.5 cm, U/S measured a lesion 1.2 cm size, Biopsy revealed IDC Grade 3, ER/PR Negative, Her 2 Neg Ratio: 1.05; Ki 67: 20%; T1bN0 Stage 1A Clinical stage   12/12/2016 Cancer Staging   Staging form: Breast, AJCC 8th Edition - Clinical stage from 12/12/2016: Stage IB (cT1c, cN0, cM0, G3, ER-, PR-, HER2-) - Signed by Odean Potts, MD on 03/18/2023 Histologic grading system: 3 grade system Laterality: Right Staged by: Pathologist and managing physician Stage used in treatment planning: Yes National guidelines used in treatment planning: Yes Type of national guideline used in treatment planning: NCCN   12/24/2016 Genetic Testing   The patient had genetic testing due to a personal and family history of breast cancer.  The [facility] Common Hereditary Cancer Panel + Melanoma panel was sent out. The Hereditary Gene Panel + Melanoma Panel offered by [facility] includes sequencing and/or deletion duplication testing of the following 53 genes: APC, ATM, AXIN2, BAP1, BARD1, BMPR1A, BRCA1, BRCA2, BRIP1, CDH1, CDK4, CDKN2A (p14ARF), CDKN2A (p16INK4a), CHEK2, CTNNA1, DICER1, EPCAM (Deletion/duplication testing only), GREM1 (promoter region deletion/duplication testing only), KIT, MC1R,  MEN1, MLH1, MSH2, MSH3, MSH6, MUTYH, NBN, NF1, NHTL1, PALB2, PDGFRA, PMS2, POLD1, POLE, POT1, PTEN, RAD50, RAD51C, RAD51D, RB1, SDHB, SDHC, SDHD, SMAD4, SMARCA4. STK11, TERT, TP53, TSC1, TSC2, and VHL.  The following genes were evaluated for sequence changes only: MITF, SDHA and HOXB13 c.251G>A variant only.  Results: No pathogenic mutations identified. 3 VU'sS in BRCA1 c.2967T>A (p.Phe989Leu) , MSH6 c.2857G>A (p.Glu953Lys), and TERT c.902G>A (p.Arg301His) were identified.  The date of this test result is 12/24/2016.    UPDATE:  The MSH6 c.2857G>A VUS was reclassified to Likely Benign on 06/28/2020. The change in variant classification was made as a result of re-review of the evidence in light of new variant interpretation guidelines and/or new information.   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 Breast reconstruction 12/20/2017   09/2017 -  Anti-estrogen oral therapy   Tamoxifen  daily started 08/14/2017 discontinued 12/19/2020 and switched to letrozole  (because of uterine polyps and spotting) plan is to treat for 5 years total.  (This is primarily for the DCIS that was diagnosed in 2019, her original cancer was triple negative.)     CHIEF COMPLIANT: Surveillance of breast cancer  History of Present Illness Lorraine Dean is a 58 year old female who presents for follow-up after completing hormone therapy for breast  cancer.  She completed five years of hormone therapy last year, which included letrozole  and anastrozole. She has since discontinued the  therapy.  Her recent mammogram and MRI were normal. The MRI indicated breast density categorized as C, which is moderately dense.  She is due for a bone density test this year, as the last one was conducted in 2023.     ALLERGIES:  has no known allergies.  MEDICATIONS:  Current Outpatient Medications  Medication Sig Dispense Refill   alendronate  (FOSAMAX ) 70 MG tablet TAKE 1 TABLET ONCE A WEEK WITH A FULL GLASS OF WATER ON AN EMPTY STOMACH 12 tablet 3   atorvastatin  (LIPITOR) 10 MG tablet Take 10 mg by mouth every evening.     Biotin w/ Vitamins C & E (HAIR/SKIN/NAILS PO) Take 1 tablet by mouth every evening.     folic acid  (FOLVITE ) 1 MG tablet Take 1 tablet (1 mg total) by mouth daily.     Krill Oil 350 MG CAPS Take 350 mg by mouth daily.     letrozole  (FEMARA ) 2.5 MG tablet TAKE 1 TABLET DAILY 90 tablet 3   levothyroxine  (SYNTHROID ) 25 MCG tablet Take 1 tablet (25 mcg total) by mouth daily before breakfast.     mometasone (NASONEX) 50 MCG/ACT nasal spray 2 SPRAYS IN EACH NOSTRIL ONCE A DAY IF NEEDED NASALLY  2   Multiple Vitamins-Minerals (OCUVITE ADULT FORMULA) CAPS Take by mouth.     Multiple Vitamins-Minerals (OCUVITE PO) Take 1 tablet by mouth every evening.     naproxen sodium (ALEVE) 220 MG tablet Take 220-440 mg by mouth 2 (two) times daily as needed (for pain.).     Probiotic Product (ALIGN PO) Take 1 capsule by mouth every evening.     No current facility-administered medications for this visit.    PHYSICAL EXAMINATION: ECOG PERFORMANCE STATUS: 1 - Symptomatic but completely ambulatory  Vitals:   03/16/24 1516  BP: (!) 134/46  Pulse: 81  Resp: 18  Temp: 97.7 F (36.5 C)  SpO2: 98%   Filed Weights   03/16/24 1516  Weight: 119 lb 11.2 oz (54.3 kg)    Physical Exam Breast exam: No palpable lumps or nodules.  (exam performed in the presence of a chaperone)  LABORATORY DATA:  I have reviewed the data as listed    Latest Ref Rng & Units 12/21/2021    2:29 PM  06/15/2021    1:07 PM 08/01/2017    3:27 PM  CMP  Glucose 70 - 99 mg/dL 893  91  898   BUN 6 - 20 mg/dL 11  11  10    Creatinine 0.44 - 1.00 mg/dL 9.17  9.36  9.34   Sodium 135 - 145 mmol/L 141  139  140   Potassium 3.5 - 5.1 mmol/L 3.5  3.7  4.2   Chloride 98 - 111 mmol/L 108  104  104   CO2 22 - 32 mmol/L 24  29  25    Calcium  8.9 - 10.3 mg/dL 9.1  9.5  9.2   Total Protein 6.5 - 8.1 g/dL 7.8  7.7    Total Bilirubin 0.3 - 1.2 mg/dL 0.3  0.5    Alkaline Phos 38 - 126 U/L 54  70    AST 15 - 41 U/L 26  23    ALT 0 - 44 U/L 28  25      Lab Results  Component Value Date   WBC 7.3 12/21/2021   HGB 13.5 12/21/2021   HCT  38.6 12/21/2021   MCV 85.6 12/21/2021   PLT 223 12/21/2021   NEUTROABS 4.4 12/21/2021    ASSESSMENT & PLAN:  Malignant neoplasm of upper-outer quadrant of right breast in female, estrogen receptor positive (HCC) 12/28/2016: Right lumpectomy: IDC grade 2, 1.2 cm, broadly 0.1 cm to the posterior margin, 0/5 lymph nodes negative, ER 0%, PR 0%, HER-2 negative ratio 1.05, Ki-67 5%, T1 cN0 stage IB AJCC 8   Recommendation: 1. Adjuvant chemotherapy with dose dense Adriamycin  and Cytoxan  every 2 weeks 4 followed by weekly Taxol  12 completed 06/14/2017 2. followed by adjuvant radiation therapy ---------------------------------------------------------------------- New diagnosis of DCIS estrogen receptor positive:  08/08/2017 right mastectomy: No evidence of residual breast cancer.   Current treatment: Tamoxifen  20 mg daily started 08/14/2017 discontinued 12/19/2020 and switched to letrozole  (because of uterine polyps and spotting) plan is to treat for 5 years total.  (This is primarily for the DCIS that was diagnosed in 2019, her original cancer was triple negative.)   Tamoxifen  toxicities: Mild hot flashes Denies any arthralgias or myalgias She has 1 more year left on tamoxifen  therapy to complete 5 years.   Breast cancer surveillance: 1.  Breast exam 03/16/2024: No palpable  lumps or nodules of concern 2.  Mammogram 06/17/2023 at [facility]: Benign, breast density category C 3.  Breast MRI 02/18/2024: No evidence of malignancy breast density category C   Bone density 07/20/2021: T score -3.4: Osteoporosis On calcium  vitamin D  along with Fosamax  She is due for a bone density in 1 year.  She would like to do it at the same time as her mammogram and February.   return to clinic in 1 year for follow-up ------------------------------------- Assessment and Plan Assessment & Plan History of malignant neoplasm of upper-outer quadrant of right breast, female Completed five years of letrozole  therapy. Recent imaging satisfactory. Breast density category C, mammogram effective for this density. MRI effective but gadolinium contrast concerns noted. - Continue alternating mammogram and MRI every other year.  Osteoporosis Due for bone density scan. Previous scan in 2023, order placed but not completed. - Ordered bone density scan.      No orders of the defined types were placed in this encounter.  The patient has a good understanding of the overall plan. she agrees with it. she will call with any problems that may develop before the next visit here.  I personally spent a total of 30 minutes in the care of the patient today including preparing to see the patient, getting/reviewing separately obtained history, performing a medically appropriate exam/evaluation, counseling and educating, placing orders, referring and communicating with other health care professionals, documenting clinical information in the EHR, independently interpreting results, communicating results, and coordinating care.   Viinay K Sloka Volante, MD 03/16/24

## 2024-03-18 ENCOUNTER — Telehealth: Payer: Self-pay

## 2024-03-18 ENCOUNTER — Ambulatory Visit: Payer: BC Managed Care – PPO | Admitting: Hematology and Oncology

## 2024-03-18 NOTE — Telephone Encounter (Signed)
 Per md orders placed for [facility] through their portal.

## 2024-06-10 ENCOUNTER — Other Ambulatory Visit: Payer: Self-pay | Admitting: *Deleted

## 2024-06-10 DIAGNOSIS — Z780 Asymptomatic menopausal state: Secondary | ICD-10-CM

## 2024-06-10 DIAGNOSIS — M816 Localized osteoporosis [Lequesne]: Secondary | ICD-10-CM

## 2024-06-10 NOTE — Progress Notes (Signed)
 Per MD request RN successfully faxed bone density orders to [facility].

## 2025-02-23 ENCOUNTER — Inpatient Hospital Stay: Admitting: Hematology and Oncology
